# Patient Record
Sex: Female | Born: 1935 | Race: White | Hispanic: No | Smoking: Never smoker
Health system: Southern US, Community
[De-identification: ages and names within clinical notes are randomized; demographics above are authoritative.]

## PROBLEM LIST (undated history)

## (undated) DIAGNOSIS — E785 Hyperlipidemia, unspecified: Secondary | ICD-10-CM

## (undated) DIAGNOSIS — Z8679 Personal history of other diseases of the circulatory system: Secondary | ICD-10-CM

## (undated) DIAGNOSIS — I252 Old myocardial infarction: Secondary | ICD-10-CM

## (undated) DIAGNOSIS — I2510 Atherosclerotic heart disease of native coronary artery without angina pectoris: Secondary | ICD-10-CM

## (undated) DIAGNOSIS — I6529 Occlusion and stenosis of unspecified carotid artery: Secondary | ICD-10-CM

## (undated) DIAGNOSIS — Z951 Presence of aortocoronary bypass graft: Secondary | ICD-10-CM

## (undated) DIAGNOSIS — E7800 Pure hypercholesterolemia, unspecified: Secondary | ICD-10-CM

## (undated) DIAGNOSIS — I959 Hypotension, unspecified: Secondary | ICD-10-CM

## (undated) HISTORY — DX: Presence of aortocoronary bypass graft: Z95.1

## (undated) HISTORY — DX: Old myocardial infarction: I25.2

## (undated) HISTORY — DX: Hyperlipidemia, unspecified: E78.5

## (undated) HISTORY — DX: Personal history of other diseases of the circulatory system: Z86.79

## (undated) HISTORY — DX: Atherosclerotic heart disease of native coronary artery without angina pectoris: I25.10

## (undated) HISTORY — DX: Occlusion and stenosis of unspecified carotid artery: I65.29

## (undated) HISTORY — DX: Pure hypercholesterolemia, unspecified: E78.00

## (undated) HISTORY — DX: Hypotension, unspecified: I95.9

---

## 2007-11-19 ENCOUNTER — Encounter: Payer: Self-pay | Admitting: Emergency Medicine

## 2007-11-19 ENCOUNTER — Inpatient Hospital Stay (HOSPITAL_COMMUNITY): Admission: EM | Admit: 2007-11-19 | Discharge: 2007-12-04 | Payer: Self-pay | Admitting: Cardiovascular Disease

## 2007-11-19 DIAGNOSIS — I252 Old myocardial infarction: Secondary | ICD-10-CM

## 2007-11-19 HISTORY — DX: Old myocardial infarction: I25.2

## 2007-11-21 HISTORY — PX: CORONARY ANGIOPLASTY WITH STENT PLACEMENT: SHX49

## 2007-11-23 ENCOUNTER — Encounter: Payer: Self-pay | Admitting: Surgery

## 2007-11-23 ENCOUNTER — Ambulatory Visit: Payer: Self-pay | Admitting: Surgery

## 2007-11-24 ENCOUNTER — Encounter: Payer: Self-pay | Admitting: Surgery

## 2007-11-30 DIAGNOSIS — Z951 Presence of aortocoronary bypass graft: Secondary | ICD-10-CM

## 2007-11-30 HISTORY — PX: CORONARY ARTERY BYPASS GRAFT: SHX141

## 2007-11-30 HISTORY — DX: Presence of aortocoronary bypass graft: Z95.1

## 2008-01-02 ENCOUNTER — Encounter: Admission: RE | Admit: 2008-01-02 | Discharge: 2008-01-02 | Payer: Self-pay | Admitting: Surgery

## 2008-01-02 ENCOUNTER — Ambulatory Visit: Payer: Self-pay | Admitting: Surgery

## 2010-05-01 IMAGING — CT CT ANGIO ABDOMEN
2 of 5 series · 16 of 46 positions shown, 18 images · IV contrast (APPLIED)
Comparison: 11/19/2007

CLINICAL DATA: Aortic dissection, right chest pain

CT ANGIOGRAPHY OF THE CHEST, ABDOMEN, AND PELVIS WITH AND WITHOUT
CONTRAST:
TECHNIQUE: CT angiography of the chest, abdomen, and pelvis
performed using aortic protocol.
Examination is performed before and following 100 ml 8mnipaque-4DD.
Sagittal and coronal images reconstructed from axial data set.
Contrast:  100 ml 8mnipaque-4DD.

[Series 5: dissection 2.0 st · axial · 0.72mm/px · z∈[-572,-62]mm · 13 of 287 slices shown, 15 images]
[im 16/287  soft-tissue]
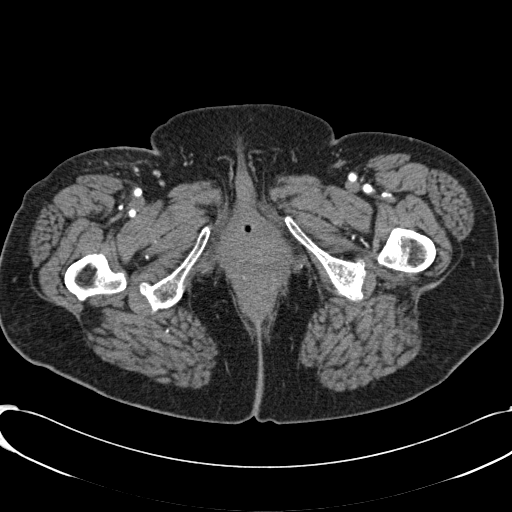
[im 16/287  bone]
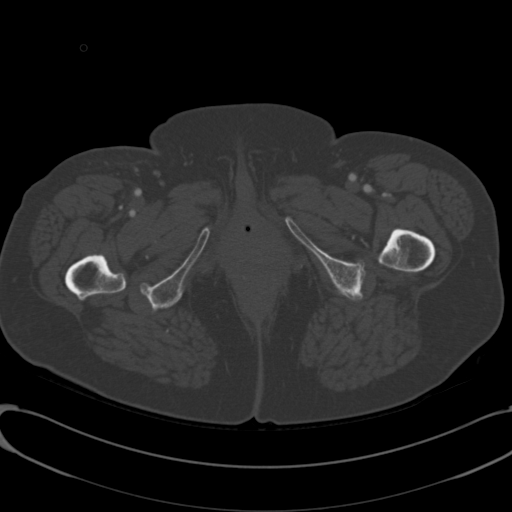
[im 46/287  soft-tissue]
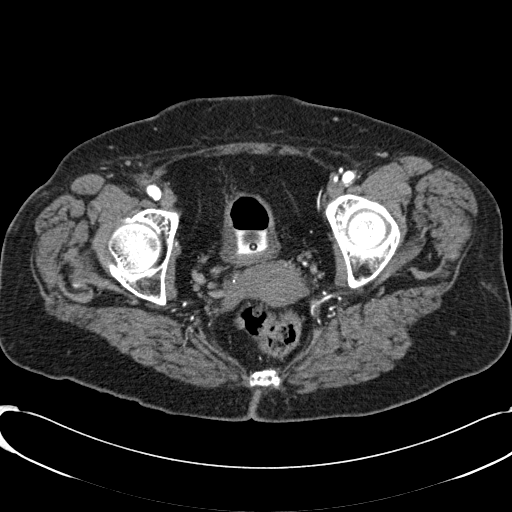
[im 61/287  soft-tissue]
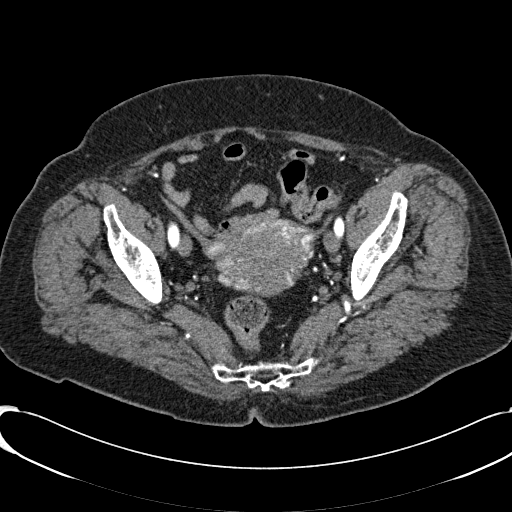
[im 76/287  soft-tissue]
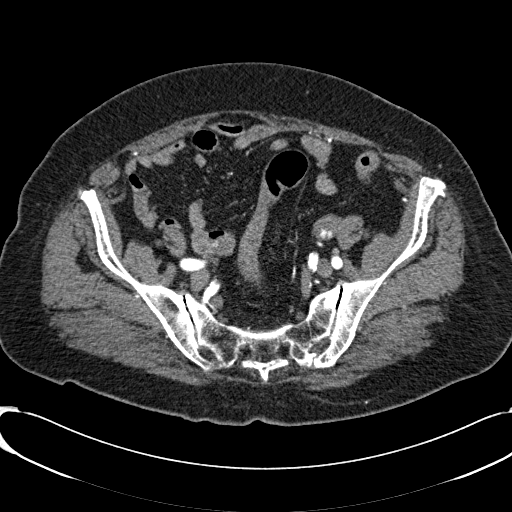
[im 106/287  soft-tissue]
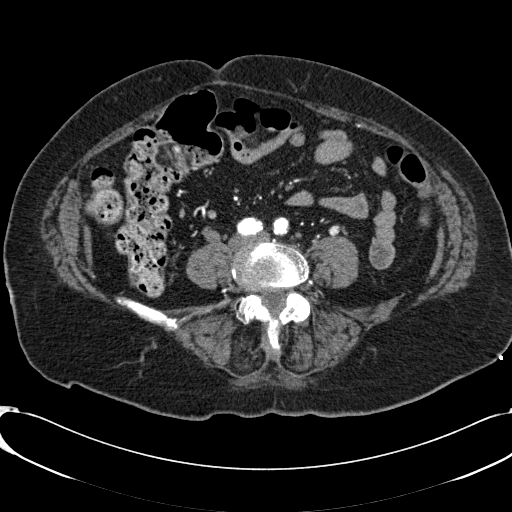
[im 121/287  soft-tissue]
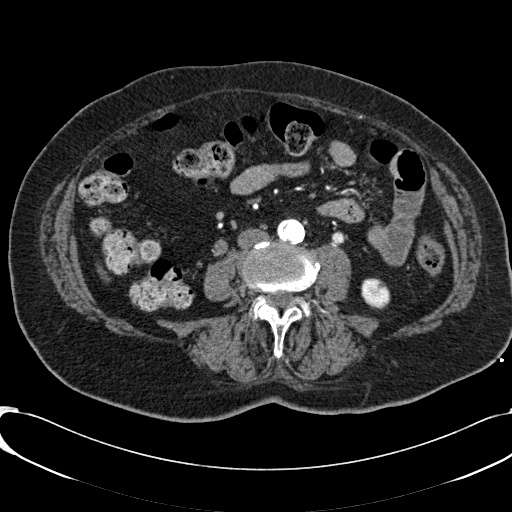
[im 151/287  soft-tissue]
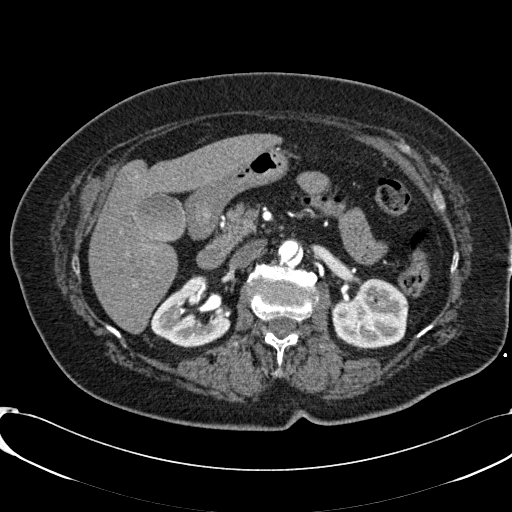
[im 166/287  soft-tissue]
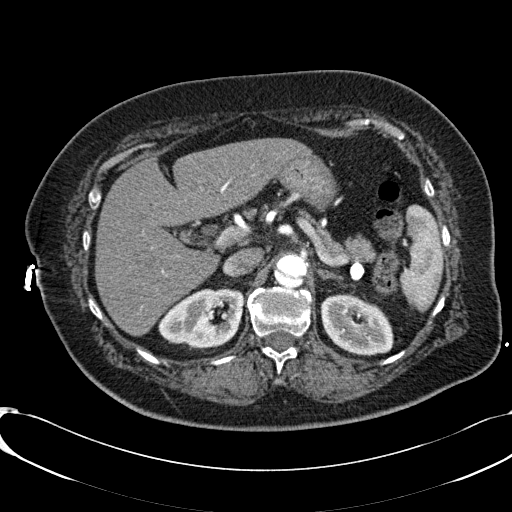
[im 181/287  soft-tissue]
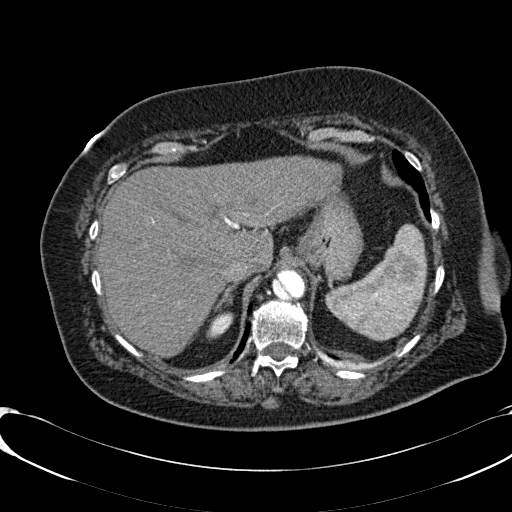
[im 181/287  bone]
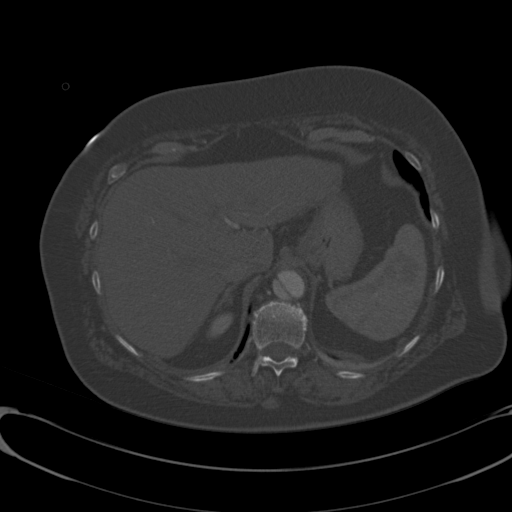
[im 211/287  soft-tissue]
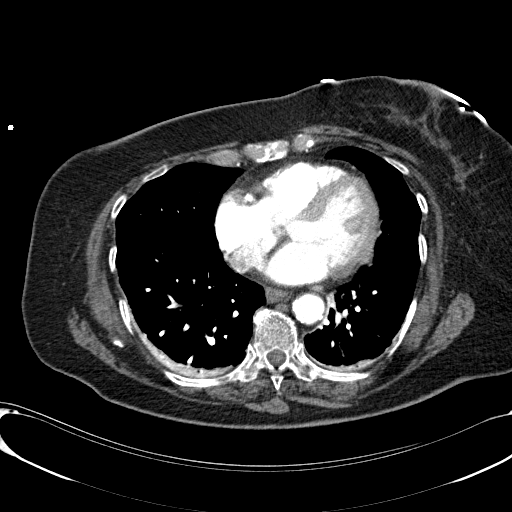
[im 226/287  soft-tissue]
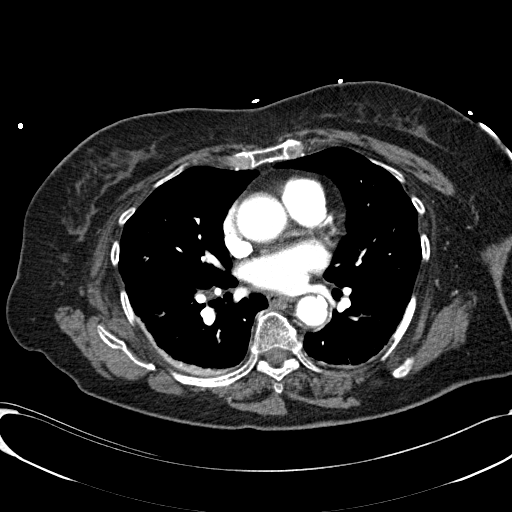
[im 241/287  soft-tissue]
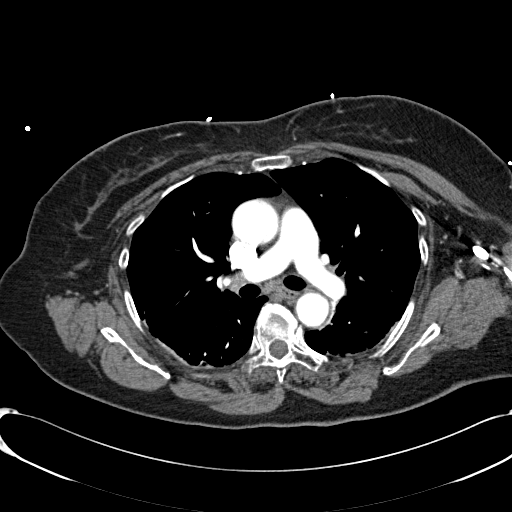
[im 271/287  soft-tissue]
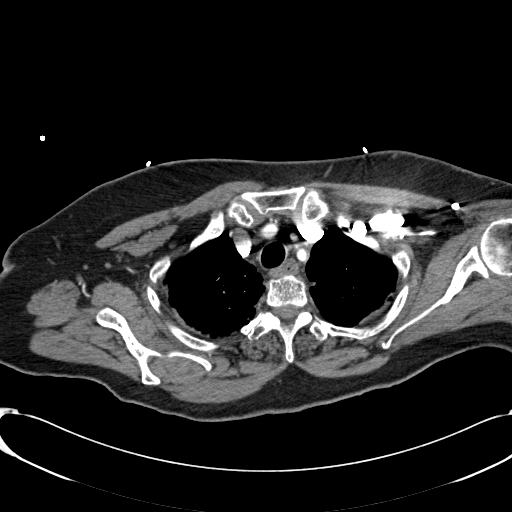

[Series 8: dissection 2.0 thins · coronal · 1.12mm/px · 3 of 100 slices shown]
[im 34/100  soft-tissue]
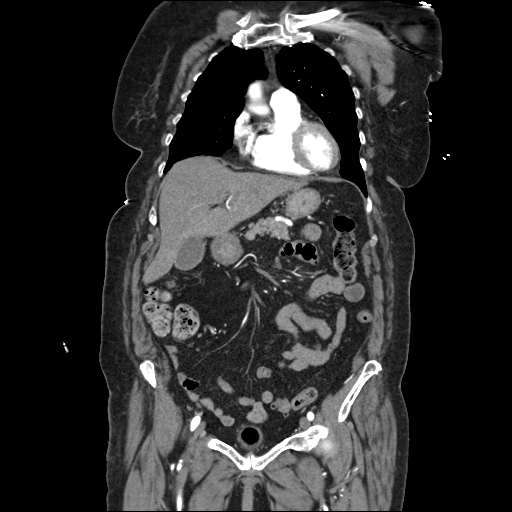
[im 45/100  soft-tissue]
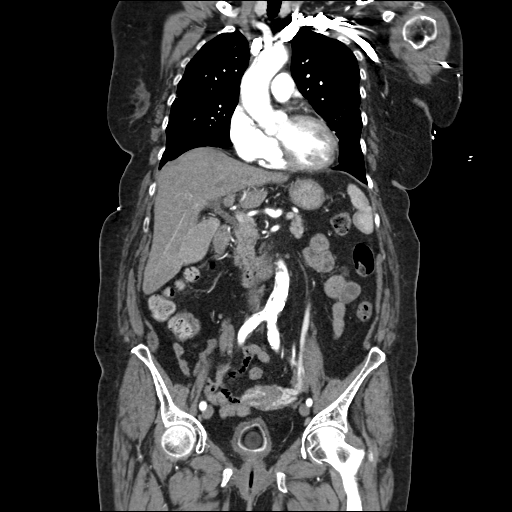
[im 56/100  soft-tissue]
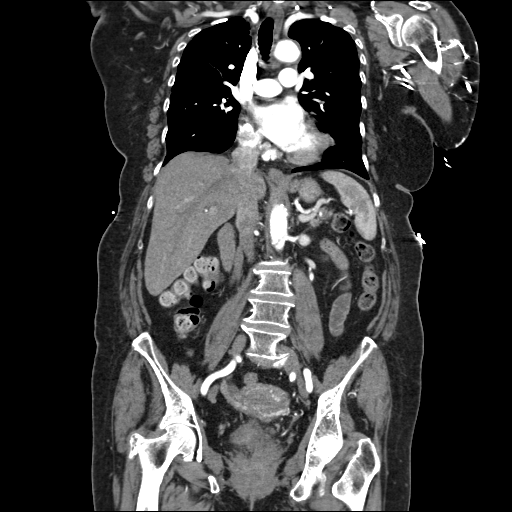

[16 of 46 positions shown; findings below may reference images not displayed]

CT ANGIOGRAPHY CHEST:

Ascending aorta and aortic arch are normal appearance.
No intramural hematoma on noncontrast images.
Proximal descending thoracic aorta normal caliber.
Scattered atherosclerotic calcifications present.
Aortic dissection identified at distal thoracic aorta beginning
just above the diaphragm and extending through the aortic hiatus
into upper abdominal aorta.
Pulmonary arteries patent.
Bibasilar atelectasis.
No thoracic adenopathy.
No pleural effusion or pneumothorax.
IMPRESSION: [HOSPITAL] Type B aortic dissection beginning in distal descending
thoracic aorta and extending into abdominal aorta, see below.

CT ANGIOGRAPHY ABDOMEN:

Aortic dissection extends from the distal descending thoracic aorta
into aortic bifurcation.
No extension of dissection into celiac, superior mesenteric, or
renal arteries.
An accessory right renal artery is noted.
IMA origin patent, though there is now opacification of a short
segment of proximal and may best appreciated on axial images at 168-
175.
Small amount of excreted contrast in renal pelves bilaterally.
Tiny cyst lower pole left kidney.
Remaining solid organs and bowel loops in upper abdomen
unremarkable.
Small calcified splenic artery aneurysm, 1.4 cm greatest size.
IMPRESSION: Aortic dissection extending from distal thoracic aorta to aortic
bifurcation, new since prior exam of 11/19/2007
Short segment of non opacification of proximal inferior mesenteric
artery, question high grade stenosis versus occlusion.

CT PELVIS:

Aortic dissection extends to aortic bifurcation but does not extend
into common iliac arteries.
Common iliac arteries are minimally prominent in size at 12 mm
diameter bilaterally.
Large and small bowel loops in pelvis normal appearance.
Air and Foley catheter with excreted contrast in urinary bladder.
No pelvic mass, adenopathy, free fluid, or inflammatory process.
No aneurysmal dilatation of distal aorta.
Mild degenerative disc disease changes spine.
IMPRESSION: No extension of aortic dissection into common iliac arteries or
pelvis.

## 2010-07-13 HISTORY — PX: NM MYOVIEW LTD: HXRAD82

## 2010-11-10 NOTE — Cardiovascular Report (Signed)
NAMEMANJU, KULKARNI                 ACCOUNT NO.:  [id number]   MEDICAL RECORD NO.:  [id number]          PATIENT TYPE:  INP   LOCATION:  2922                         FACILITY:  [facility]   PHYSICIAN:  Madaline Savage, M.D.DATE OF BIRTH:  12-Aug-1927   DATE OF PROCEDURE:  11/23/2007  DATE OF DISCHARGE:                            CARDIAC CATHETERIZATION   PROCEDURES:  1. Attempted right percutaneous femoral artery approach to cardiac      catheterization, which was never performed.  2. Right femoral sheath angiogram of the right femoral artery.   COMPLICATIONS:  The patient had aortic dissection, which ended at the  level of the diaphragms in the abdominal aorta.  Further studies, none.   PATIENT PROFILE:  [name] is a 77 year old married white female who  is a cardiology patient of [facility]  admitted by Dr. Susa Griffins.  She had had unstable angina and non-  ST-segment elevation MI and underwent elective cardiac catheterization  and stenting by Dr. Nanetta Batty, on Nov 21, 2007.  She was noted to  have severe stenosis in her mid-right coronary artery and in her more  distal RCA had a 99% stenosis, both of which were repaired with drug-  eluting stents.  She also had normal LV systolic function.   She also had a 90% proximal LAD stenosis, a 60% mid-LAD stenosis, and  then a 70% mid-LAD stenosis.  Today, the plan was to bring her to the  cath lab and perform coronary angiography in an attempt to repair the  LAD and its complex anatomical blockage by percutaneous intervention.   Before any of the catheterization today could be performed, I  encountered dissection, first at the level of the common iliacs and/or  the distal aorta, which I was able to ultimately navigate and then I got  into the abdominal aorta and then I encountered a 100% occlusion in the  abdominal aorta right at the level of the diaphragms.  The patient had  her Angiomax stopped  at that point.  I confirmed that there was no  dissection at the distal end of the indwelling sheath that was used for  today's catheterization and at that point we called Dr. Evelene Croon,  who came in a very prompt fashion and reviewed the case with me.   It was discussed that the patient will likely have no long-term sequelae  from this dissection and that surgical intervention would not be  required.   We discussed it and decided to perform a CT of the abdominal aorta later  on today to have more information about the stent of the dissection and  to manage it medically.   The patient has had drug-eluting stents placed 48 hours ago and he will  need to remain on aspirin and Plavix for that.  The patient will be  returned to her room in the coronary care step-down unit and we will  follow her hemoglobin, vital signs, and then other clinical parameters.           ______________________________  Madaline Savage, M.D.  WHG/MEDQ  D:  11/23/2007  T:  11/24/2007  Job:  [id number]   cc:   Cardiac Catheterization Lab, [facility]  Nanetta Batty, M.D.

## 2010-11-10 NOTE — Op Note (Signed)
NAMESREYA, FROIO                 ACCOUNT NO.:  [id number]   MEDICAL RECORD NO.:  [id number]          PATIENT TYPE:  INP   LOCATION:  2310                         FACILITY:  [facility]   PHYSICIAN:  Evelene Croon, M.D.     DATE OF BIRTH:  07/25/1927   DATE OF PROCEDURE:  11/30/2007  DATE OF DISCHARGE:                               OPERATIVE REPORT   PREOPERATIVE DIAGNOSIS:  Severe two-vessel coronary artery disease.   POSTOPERATIVE DIAGNOSIS:  Severe two-vessel coronary artery disease.   OPERATIVE PROCEDURE:  Median sternotomy, extracorporeal circulation,  coronary artery bypass graft surgery x3 using a left internal mammary  artery graft to left anterior descending coronary artery, with saphenous  vein graft to the first and second diagonal branches of the left  anterior descending artery.  Endoscopic vein harvesting from the right  leg.   ATTENDING SURGEON:  Evelene Croon, MD   ASSISTANT:  Loreli Slot, MD   SECOND ASSISTANT:  Rowe Clack, PA-C   ANESTHESIA:  General endotracheal.   CLINICAL HISTORY:  This patient is a 77 year old woman with severe three-  vessel coronary artery disease who was admitted on Nov 19, 2007 with a  non-ST segment elevation MI.  She underwent an acute catheterization  which showed severe two-vessel coronary disease.  The LAD had 90%  proximal stenosis just at the takeoff of a large first diagonal branch.  Then, there was 60 and 70% proximal and mid LAD stenoses straddling a  moderate size second diagonal branch.  The LAD was a large vessel that  wrapped around the apex.  The left circumflex was relatively small and  had no significant disease.  The right coronary artery was a large  dominant vessel that had 80% midvessel stenosis and a 99% distal  stenosis just beyond the posterior descending branch.  Left ventricular  function was normal.  The patient underwent acute stenting of the right  coronary lesions with a good result.  The patient  had drug-eluting  stents placed.  She was then brought back to the catheterization lab on  Nov 23, 2007 at which time she had attempted PCI of the LAD lesions.  Unfortunately, there was difficulty and advancing a catheter up through  the right iliac system and after injection of contrast into the catheter  was noted the patient had a localized aortic dissection extending from  the area of the aortic bifurcation up to the level of diaphragm.  The  procedure was aborted that time and I was asked to see the patient in  the catheterization lab.  After review of the catheterization, I felt  that this was a limited dissection and no intervention was needed.  She  had neurovascular compromise at that time.  She underwent a CT angiogram  of the thoracic and abdominal aorta and the dissection appeared to be  limited to the area of the aortic bifurcation up to the diaphragm.  There was contrast in both the true and false lumens with a true lumen  supplied all the abdominal visceral vessels.  The patient was maintained  on  Plavix during the new drug-eluting stents.  She continued to have  chest pain off and on as well as some lower back pain felt to be  secondary to the dissection.  She remained stable throughout.  After  further review, it was felt that the best treatment for her would be to  proceed with coronary bypass surgery to revascularize her high-grade LAD  and diagonal stenosis.  I discussed the operative procedure with her and  her husband including alternatives, benefits, and risks including but  not limited to bleeding, blood transfusion, infection, stroke,  myocardial infarction, graft failure, and death.  They understood and  agreed to proceed.   OPERATIVE PROCEDURE:  The patient was taken operating room and placed on  table in supine position.  After induction of general endotracheal  anesthesia, a Foley catheter was placed in bladder using sterile  technique.  Then the chest,  abdomen, and both lower extremities were  prepped and draped in usual sterile manner.  The chest was entered  through a median sternotomy incision.  The pericardium of midline.  Examination heart showed good ventricular contractility.  The ascending  aorta had no palpable plaques in it.  The aorta was in fairly normal  size.   Then, the left internal mammary artery was harvested from the chest wall  as pedicle graft.  This is a medium caliber vessel with excellent blood  flow through it.  At the same time, a segment of greater saphenous vein  was harvested from the right leg using endoscopic vein harvest  technique.  This vein was a medium size and good quality.   Then, the patient was heparinized when an adequate activated clotting  time was achieved.  The distal ascending aorta was cannulated using 20-  French aortic cannula for arterial inflow.  Venous outflow was achieved  using a two-stage venous cannula through the right atrial appendage.  An  antegrade cardioplegia and vent cannula was inserted aortic root.   The patient placed on cardiopulmonary bypass and distal coronaries  identified.  The LAD was a large graftable vessel.  Both diagonal  branches were moderate sized graftable vessels.   Then, the aorta was crossclamped at 500 mL of cold blood antegrade  cardioplegia was administered in the aortic root with quick arrest of  the heart.  Systemic hypothermia to 20 degrees centigrade and topical  hypothermic iced saline was used.  A temperature probe was placed in  septum insulating pad in the pericardium.   The first distal anastomosis was then performed to the first diagonal  branch.  The internal diameter was 1.75 mm.  The conduit used was a  segment of greater saphenous vein anastomosis performed end-to-side  manner using continuous 7-0 Prolene suture.  Flow was measured through  the graft was excellent.   Second distal anastomosis was performed the second diagonal  branch.  The  internal diameter of this vessel was about 1.6 mm.  Conduit used was a  second segment of greater saphenous vein and the anastomosis performed  end-to-side manner using continuous 7-0 Prolene suture.  Flow was  measured through the graft was excellent.  Then, another dose of  cardioplegia given down vein grafts and aortic root.   The third distal anastomosis was performed to the midportion of the left  anterior descending coronary artery.  The internal diameter of this  vessel was about 2 mm.  Conduit used was the left internal mammary  graft.  This brought through an opening  left pericardium anterior of the  phrenic nerve.  It was anastomosed the LAD in end-to-side manner using  continuous 8-0 Prolene suture.  The pedicle was sutured to the  epicardium 6-0 Prolene sutures.  The patient was then rewarmed to 37  degrees centigrade.  With crossclamp in place, 2 proximal vein graft  anastomosis were performed in end-to-side manner using continuous 6-0  Prolene suture.  Then clamp was removed from mammary pedicle.  There is  rapid warming of the ventricular septum and return of spontaneous  ventricular fibrillation.  The crossclamp removed with time of 47  minutes and the patient spontaneously converted to sinus rhythm.   The proximal and distal anastomoses appeared hemostatic while the grafts  satisfactory.  Graft markers placed around the proximal anastomoses.  Two temporary right ventricular and right atrial pacing wires placed  above the skin.   When the patient rewarmed to 37 degrees centigrade, she was weaned from  cardiopulmonary bypass on no inotropic agents.  Total bypass time was 60  minutes.  Cardiac function appeared excellent.  Cardiac output of 5.5  liters a minute.  Protamine was given and the venous and the aortic  cannula was removed without difficulty.  Hemostasis was achieved.  The  patient was given 1 unit of platelet transfusion due to being on Plavix   preoperatively.  Then, three chest tubes were placed with tube in the  post pericardium, one in the left pleural space, one in the anterior  mediastinum.  The sternum was closed #6 stainless steel wires.  The  fascia was closed with continuous #1 Vicryl suture.  Subcutaneous tissue  was closed with continuous 2-0 Vicryl and the skin with 3-0 Vicryl  subcuticular closure.  The lower extremity vein harvest site was closed  in layers in similar manner.  The sponge, needle, and instrument counts  were correct according to the scrub nurse.  Dry sterile dressing applied  over the incisions around the chest tubes with Pleur-Evac suction.  The  patient remained hemodynamically stable and transferred to the SICU in  guarded but stable condition.      Evelene Croon, M.D.  Electronically Signed     BB/MEDQ  D:  11/30/2007  T:  12/01/2007  Job:  [id number]   cc:   Madaline Savage, M.D.

## 2010-11-10 NOTE — Assessment & Plan Note (Signed)
OFFICE VISIT   [name]  DOB:  11/05/1927                                        January 02, 2008  CHART #:  [id number]   HISTORY:  The patient returned today for followup status post coronary  artery bypass graft surgery x3 on 11/30/2007.  This was performed after  she had a catheter-related abdominal aortic dissection during attempted  percutaneous intervention.  Her postoperative course was uncomplicated  and she was maintained on Plavix due to having a new drug-eluting stent  in place.  Since discharge, she has been feeling great and is walking  daily without chest pain or shortness of breath.   PHYSICAL EXAMINATION:  VITAL SIGNS:  Her blood pressure is 117/77.  Her  pulse is 80 and regular.  Respiratory rate is 16 and unlabored.  Oxygen  saturation on room air is 100%.  GENERAL:  She looks well.  CARDIAC:  Regular rate and rhythm with normal heart sounds.  LUNG:  Clear.  CHEST:  The chest incision is healing well and the sternum is stable.  EXTREMITIES:  Her right leg incision is healing well.  There is no  peripheral edema.  Pedal pulses are palpable bilaterally.   IMAGING STUDIES:  Followup chest x-ray shows clear lung fields and no  pleural effusions.   MEDICATIONS:  1. Aspirin 325 mg daily.  2. Plavix 75 mg daily.  3. Zocor 40 mg daily.  4. Toprol-XL 25 mg daily.  5. Multivitamin daily.  6. Calcium daily.  7. Vitamin C and D daily.   She asked me that if she could decrease her aspirin to 81 mg per day  since she said she is tending to get hemorrhages in her skin very easily  on both aspirin and Plavix.  I told her this should be fine.   IMPRESSION:  Overall, the patient is having a very good recovery  following coronary bypass surgery.  Her abdominal aortic dissection has  clinically remained stable and she shows no signs of peripheral  malperfusion.  I would expect this to cause no long-term problems for  her.  She did have  preoperative carotid Dopplers, which showed a 40-60%  right internal carotid artery stenosis and a 60-80% left internal  carotid artery stenosis and I will let Dr. Elsie [name] decide how he was to  follow that up.  She probably should have another followup ultrasound in  about 6 months.  I told her she could return to driving a car when feels  comfortable with that, but should refrain from lifting anything heavier  than 10 pounds for a total of 3 months from the date of surgery.  She  will continue to follow up with Dr. Elsie [name] and will contact me if she  develops any problems with her incision.   [name], M.D.  Electronically Signed   BB/MEDQ  D:  01/02/2008  T:  01/03/2008  Job:  [id number]   cc:   [name], M.D.

## 2010-11-10 NOTE — Cardiovascular Report (Signed)
NAMEMEDA, DUDZINSKI                 ACCOUNT NO.:  [id number]   MEDICAL RECORD NO.:  [id number]          PATIENT TYPE:  INP   LOCATION:  2922                         FACILITY:  [facility]   PHYSICIAN:  Nanetta Batty, M.D.   DATE OF BIRTH:  21-Dec-1927   DATE OF PROCEDURE:  DATE OF DISCHARGE:                            CARDIAC CATHETERIZATION   Ms. Iribe is a delightful 77 year old female admitted on Nov 19, 2007  with unstable angina/non ST-segment elevation MI.  She has a history  hyperlipidemia.  She saw Dr. Elsie Lincoln 17 years ago.  Her EKG showed a  potential posterior MI and her troponins were mildly elevated.  She was  on heparin and integralin, and had continuing chest pain.  She presents  now for diagnostic coronary angiography to define her anatomy, rule out  ischemic etiology.   DESCRIPTION OF PROCEDURE:  The patient was brought to the second floor  [facility] in a postabsorptive state.  She is  premedicated with p.o. Valium and IV fentanyl.  Her right groin was  prepped and shaved in usual sterile fashion.  1% Xylocaine was used as  local anesthesia.  A 6-French sheath was inserted into the right femoral  artery using standard Seldinger technique.  A 6-French right-left  Judkins diagnostic catheter as well as 6-French pigtail catheter were  used for selective coronary angiography, left ventriculography,  supravalvular aortography, and distal abdominal aortography.  Visipaque  dye was used entirety of the case.  Aortic, left ventricular fundi  pressures were recorded.   Hemodynamics:  1. Aortic systolic pressure 115 and diastolic pressure 68.  2. Left ventricle systolic pressure 115 with end-diastolic pressure      and diastolic pressure was not elevated.   Selective coronary angiography:  1. Left main 20% tapered ostial stenosis.   LAD:  LAD had 90% stenosis.  There was an apple-core lesion just before  the first diagonal branch.  Following this, there was 60%  segmental  hypodense stenosis between D1 and D2 as well as after D2.   Circumflex:  Nondominant, free of significant disease.   Right coronary:  This a dominant vessel with a long 80% segmental lesion  at the genu and 99% lesion between the small PDA and the first PLA  branch.   Left ventriculography:  RAO left ventriculogram performed using 20 mL of  Visipaque dye at 10 mL per second.  Overall LVEF was estimated at  greater than 50% without focal wall motion abnormalities.   Supravalvular aortography:  Performed in the LAO view using 20 mL of  Visipaque dye at 20 mL per second.  The arch vessels were intact and  there is no AI.  Supravalvular exam of the aorta appeared generous.   Distal abdominal aortography:  Distal abdominal aortogram was performed  using 20 mL of Visipaque dye at 20 mL per second.  The renal arteries  widely patent.  The infrarenal abdominal aorta and iliac bifurcation  were free of significant arthrosclerotic changes.   IMPRESSION:  Ms. Ruggiero has two-vessel disease with a posterior  myocardial infraction, the  infarct related artery was the dominant  right.  I reviewed the angiograms with Dr. Jacinto Halim, we both agree that  percutaneous coronary intervention and stenting of the right coronary  artery followed by staged left anterior descending intervention is an  appropriate strategy.   The patient was on Integrilin infusion.  She received 3500 units of  heparin intravenously with an ACT above 232.  She had received aspirin,  receive Plavix 600 mg p.o. as well as Pepcid IV.   Using a 6-French JR-4 guide catheter along with 16109 [employer] soft wire  soft wire and a 2.5 x 12 Voyager PTCA was performed of the distal right  as well as the mid right coronary artery.  Following this, a 3.0 x 12  PROMUS stent was then deployed between the small PDA and the larger PLA  at 40 atmospheres.  A 3.0 x 23 PROMUS was then deployed in the mid to  distal right at 40 atmospheres.   The distal stent was then post dilated  with a 3.0 x 10 DuraStar up to 14-15 atmospheres and the mid to distal  stent with a 3.5 x 20 DuraStar at 14-15 atmospheres.  Resulting in  reduction of 95% distal RCA stenosis to 0% residual, 80% mid to distal  at the genu to 0% residual with excellent flow.  The patient tolerated  procedure well.  The guidewire and catheter removed.  The sheath was  then secured in place.  The patient left lab in stable condition.  Plan  will be to remove the sheath once ACT falls below 170.  Integrilin will  be continued for 18 hours.  She will be treated aspirin, Plavix, beta  blockers, and statin drug.  She will be gently hydrated.  Plans will be  to perform staged LAD, PCI later this week.  The patient left lab in  stable condition.      Nanetta Batty, M.D.  Electronically Signed     JB/MEDQ  D:  11/21/2007  T:  11/22/2007  Job:  [id number]   cc:   Madaline Savage, M.D.

## 2010-11-10 NOTE — Discharge Summary (Signed)
[name], [name]                 ACCOUNT NO.:  [id number]   MEDICAL RECORD NO.:  [id number]          PATIENT TYPE:  INP   LOCATION:  2017                         FACILITY:  [facility]   PHYSICIAN:  Evelene Croon, M.D.     DATE OF BIRTH:  Aug 24, 1927   DATE OF ADMISSION:  11/19/2007  DATE OF DISCHARGE:  12/04/2007                               DISCHARGE SUMMARY   HISTORY OF PRESENT ILLNESS:  The patient is a 77 year old white female  with no previous cardiac history who develops sudden chest pain on the  night of admission.  She was taking a nap on the sofa and woke up to go  to bed when the pain presented in the midsternal region, 10/10, with  radiation to both arms as well as to her teeth.  Additionally, she felt  very dizzy and had blurred vision.  She thought she would fall.  Her  husband drove her over from [place] to the Emergency Department at  [facility], where she was treated for unstable coronary  syndrome and upon stabilization was transferred to [facility]  for further evaluation and treatment to include cardiac catheterization.   PAST MEDICAL HISTORY:  Includes hypercholesterolemia and history of a  vaginal cyst in her early 39s with septicemia.  She has had childbirth  x10.  She had no previous cardiac history.   ALLERGIES:  No known drug allergies.   MEDICATIONS PRIOR TO ADMISSION:  Included fish oil and vitamins.   FAMILY HISTORY:  Please see the history and physical done at the time of  admission.   SOCIAL HISTORY:  Please see the history and physical done at the time of  admission.   REVIEW OF SYSTEMS:  Please see the history and physical done at the time  of admission.   PHYSICAL EXAMINATION:  Please see the history and physical done at the  time of admission.   HOSPITAL COURSE:  The patient was admitted and ruled inferior non-ST-  segment myocardial infarction with acute coronary syndrome.  Findings on  EKG were consistent with posterior  ischemia.  She was scheduled and  underwent cardiac catheterization on Nov 21, 2007 by Dr. Nanetta Batty.  She underwent PCI and stent placement in the right coronary artery.  The  schedule following this procedure was to proceed with LAD  revascularization in a staged procedure.  She tolerated the initial  catheterization.  She was brought back to the cardiac catheterization  lab on Nov 23, 2007 by Dr. Elsie Lincoln.  At that time, she underwent an  aortic dissection due to the catheter.  The patient underwent a CT scan,  which confirmed the type B dissection.  The patient also had a thoracic  surgical consultation with Evelene Croon, MD, who evaluated the patient  and the studies.  His recommendation was to proceed with surgical  revascularization of the LAD and diagonal segments as she had ongoing  ischemia.  The patient remained stable, although she did have ongoing  abdominal and back pain related to the dissection.  A repeat CT  angiogram revealed  this to be a stable process.  It was continued to be  treated medically.  She was deemed to be acceptable for proceeding with  surgery, and on November 30, 2007, she underwent the following procedure of  coronary artery bypass grafting x3.  The following grafts were placed:  1. Left internal mammary artery to the LAD.  2. Saphenous vein graft to the diagonal #1.  3. Saphenous vein graft to the diagonal #3.   She tolerated this procedure well and was taken to the surgical  intensive care unit in a stable condition.   POSTOPERATIVE HOSPITAL COURSE:  The patient has overall done quite well.  She has remained hemodynamically stable.  Due to the drug-eluting  stents, she was placed back on aspirin as well as Plavix.  All routine  lines, monitors, and drainage devices were discontinued in a standard  fashion.  She had no recurrence of her abdominal or back pain symptoms  related to the dissection.   LABORATORY DATA:  Laboratory values did reveal  moderate postoperative  anemia with most recent hemoglobin and hematocrit dated December 02, 2007 of  9.7 and 28.3.  Electrolytes, BUN, and creatinine are within normal  limits.  Incisions are all healing well without signs of infection.  She  is now back at her preoperative weight after a gentle diuresis.  Oxygen  has been weaned and she maintains good saturations on room air.  Her  rhythm has remain sinus without significant ectopy or dysrhythmias.  Overall, her status is felt to be acceptable for discharge on today's  date, December 04, 2007.   MEDICATIONS ON DISCHARGE:  1. Multivitamin 1 daily.  2. Vitamin D 1 daily.  3. Vitamin C 1 daily.  4. Calcium 1 daily.  5. Aspirin 325 mg daily.  6. Plavix 75 mg daily.  7. Zocor 40 mg daily.  8. Toprol-XL 25 mg daily.  9. Ultram 50 mg q.6 h. p.r.n. as needed for pain.   INSTRUCTIONS:  The patient received written instructions regarding  medications, activities, diet, wound care, and followup.  Followup  include Dr. Elsie Lincoln in 2 weeks, Dr. Laneta Simmers in 3 weeks.   FINAL DIAGNOSES:  Non-ST-segment elevation myocardial infarction with  multivessel coronary artery disease, now status post percutaneous  coronary intervention and drug-eluting stenting of the right coronary  artery x2.  Additionally, status post coronary artery bypass grafting x3  as described above.   OTHER DIAGNOSES:  1. Postoperative anemia.  2. Hypercholesterolemia.  3. Abdominal aortic dissection.  4. Remote history of vaginal cyst with secondary septicemia.  5. Normal left ventricular ejection fraction by catheterization.      Rowe Clack, P.A.-C.      Evelene Croon, M.D.  Electronically Signed    WEG/MEDQ  D:  12/04/2007  T:  12/05/2007  Job:  [id number]   cc:   Madaline Savage, M.D.  Evelene Croon, M.D.

## 2011-03-24 LAB — CK TOTAL AND CKMB (NOT AT ARMC)
CK, MB: 4.5 — ABNORMAL HIGH
CK, MB: 8 — ABNORMAL HIGH
CK, MB: 8.7 — ABNORMAL HIGH
Relative Index: 2.9 — ABNORMAL HIGH
Relative Index: 4.8 — ABNORMAL HIGH
Total CK: 174
Total CK: 239 — ABNORMAL HIGH

## 2011-03-24 LAB — POCT CARDIAC MARKERS
CKMB, poc: 7.6
Myoglobin, poc: 113
Operator id: 294591

## 2011-03-24 LAB — BASIC METABOLIC PANEL
BUN: 18
BUN: 7
BUN: 8
CO2: 21
CO2: 23
CO2: 23
CO2: 23
CO2: 23
CO2: 24
CO2: 25
Calcium: 8.1 — ABNORMAL LOW
Calcium: 8.3 — ABNORMAL LOW
Calcium: 8.7
Chloride: 107
Chloride: 108
Chloride: 109
Chloride: 109
Chloride: 109
Chloride: 110
Creatinine, Ser: 0.54
Creatinine, Ser: 0.62
Creatinine, Ser: 0.65
Creatinine, Ser: 0.66
Creatinine, Ser: 0.69
Creatinine, Ser: 0.8
GFR calc Af Amer: >60
GFR calc Af Amer: >60
GFR calc Af Amer: >60
GFR calc Af Amer: >60
GFR calc non Af Amer: >60
Glucose, Bld: 102 — ABNORMAL HIGH
Glucose, Bld: 103 — ABNORMAL HIGH
Glucose, Bld: 107 — ABNORMAL HIGH
Glucose, Bld: 98
Potassium: 3.7
Potassium: 3.8
Potassium: 3.9
Sodium: 137
Sodium: 139

## 2011-03-24 LAB — CBC
HCT: 35.8 — ABNORMAL LOW
HCT: 36
HCT: 36
HCT: 36.5
HCT: 41.7
Hemoglobin: 12.4
Hemoglobin: 12.5
Hemoglobin: 13.1
MCHC: 33.8
MCHC: 34.2
MCHC: 34.2
MCHC: 34.3
MCHC: 34.4
MCHC: 34.6
MCHC: 34.8
MCHC: 35.3
MCHC: 36
MCV: 92.6
MCV: 93.1
MCV: 93.3
MCV: 93.5
MCV: 93.6
MCV: 94
MCV: 94.1
Platelets: 161
Platelets: 162
Platelets: 168
Platelets: 182
Platelets: 195
RBC: 3.34 — ABNORMAL LOW
RBC: 3.75 — ABNORMAL LOW
RBC: 3.83 — ABNORMAL LOW
RBC: 3.86 — ABNORMAL LOW
RBC: 3.88
RBC: 4.1
RDW: 12.9
RDW: 12.9
RDW: 13.1
RDW: 13.1
RDW: 13.2
WBC: 5.9
WBC: 6.1
WBC: 6.1
WBC: 6.8
WBC: 7

## 2011-03-24 LAB — TROPONIN I
Troponin I: 0.26 — ABNORMAL HIGH
Troponin I: 0.62
Troponin I: 0.87

## 2011-03-24 LAB — POCT I-STAT, CHEM 8
Creatinine, Ser: 0.9
Hemoglobin: 14.6
Potassium: 3.8
Sodium: 140
TCO2: 21

## 2011-03-24 LAB — HEPARIN LEVEL (UNFRACTIONATED)
Heparin Unfractionated: 0.19 — ABNORMAL LOW
Heparin Unfractionated: 0.5
Heparin Unfractionated: 0.52

## 2011-03-24 LAB — LIPID PANEL
LDL Cholesterol: 230 — ABNORMAL HIGH
Triglycerides: 85

## 2011-03-24 LAB — COMPREHENSIVE METABOLIC PANEL
ALT: 32
BUN: 9
CO2: 24
Calcium: 8.4
Creatinine, Ser: 0.57
GFR calc non Af Amer: >60
Glucose, Bld: 106 — ABNORMAL HIGH
Total Protein: 5.9 — ABNORMAL LOW

## 2011-03-24 LAB — HEMOGLOBIN A1C
Hgb A1c MFr Bld: 6.1
Mean Plasma Glucose: 140

## 2011-03-24 LAB — DIFFERENTIAL
Basophils Absolute: 0
Basophils Relative: 0
Eosinophils Absolute: 0.1
Eosinophils Absolute: 0.2
Eosinophils Absolute: 0.2
Eosinophils Relative: 2
Eosinophils Relative: 3
Lymphocytes Relative: 38
Lymphocytes Relative: 45
Lymphs Abs: 1.7
Lymphs Abs: 2.2
Lymphs Abs: 2.6
Monocytes Relative: 10
Neutro Abs: 2.3
Neutrophils Relative %: 40 — ABNORMAL LOW
Neutrophils Relative %: 49
Neutrophils Relative %: 63

## 2011-03-24 LAB — PROTIME-INR
INR: 1
Prothrombin Time: 13.1

## 2011-03-24 LAB — CARDIAC PANEL(CRET KIN+CKTOT+MB+TROPI)
CK, MB: 4
Relative Index: 3.1 — ABNORMAL HIGH

## 2011-03-24 LAB — MAGNESIUM: Magnesium: 2.3

## 2011-03-24 LAB — TYPE AND SCREEN: DAT, IgG: NEGATIVE

## 2011-03-24 LAB — TSH: TSH: 3.393

## 2011-03-25 LAB — CREATININE, SERUM
Creatinine, Ser: 0.49
GFR calc Af Amer: >60
GFR calc non Af Amer: >60

## 2011-03-25 LAB — PREPARE PLATELET PHERESIS

## 2011-03-25 LAB — CBC
HCT: 28.3 — ABNORMAL LOW
Hemoglobin: 11 — ABNORMAL LOW
Hemoglobin: 8.8 — ABNORMAL LOW
Hemoglobin: 9.7 — ABNORMAL LOW
MCHC: 34.5
MCV: 89.4
MCV: 89.7
MCV: 90.4
Platelets: 220
RBC: 2.82 — ABNORMAL LOW
RBC: 2.98 — ABNORMAL LOW
RBC: 3.1 — ABNORMAL LOW
RBC: 3.12 — ABNORMAL LOW
RBC: 3.41 — ABNORMAL LOW
RDW: 14.7
RDW: 14.8
WBC: 7.4
WBC: 7.9
WBC: 9.2

## 2011-03-25 LAB — BLOOD GAS, ARTERIAL
Bicarbonate: 22.4
O2 Saturation: 94.2
pO2, Arterial: 69.8 — ABNORMAL LOW

## 2011-03-25 LAB — BASIC METABOLIC PANEL
CO2: 23
CO2: 27
Calcium: 7.5 — ABNORMAL LOW
Calcium: 7.6 — ABNORMAL LOW
Calcium: 8.3 — ABNORMAL LOW
Calcium: 9
Chloride: 102
Chloride: 109
Creatinine, Ser: 0.51
Creatinine, Ser: 0.68
GFR calc Af Amer: >60
GFR calc Af Amer: >60
GFR calc Af Amer: >60
GFR calc Af Amer: >60
GFR calc non Af Amer: >60
GFR calc non Af Amer: >60
Sodium: 134 — ABNORMAL LOW
Sodium: 140
Sodium: 140
Sodium: 141

## 2011-03-25 LAB — POCT I-STAT 3, ART BLOOD GAS (G3+)
Acid-Base Excess: 1
Bicarbonate: 21.1
Bicarbonate: 24.7 — ABNORMAL HIGH
Bicarbonate: 25 — ABNORMAL HIGH
O2 Saturation: 100
Operator id: 203371
Operator id: 3390
Patient temperature: 33
Patient temperature: 36.1
TCO2: 22
TCO2: 26
pCO2 arterial: 30.6 — ABNORMAL LOW
pCO2 arterial: 33.2 — ABNORMAL LOW
pH, Arterial: 7.436 — ABNORMAL HIGH
pH, Arterial: 7.479 — ABNORMAL HIGH
pO2, Arterial: 344 — ABNORMAL HIGH

## 2011-03-25 LAB — CROSSMATCH

## 2011-03-25 LAB — COMPREHENSIVE METABOLIC PANEL
ALT: 29
AST: 28
Albumin: 3.2 — ABNORMAL LOW
Alkaline Phosphatase: 205 — ABNORMAL HIGH
GFR calc Af Amer: >60
Glucose, Bld: 116 — ABNORMAL HIGH
Potassium: 3.9
Sodium: 139
Total Protein: 6.6

## 2011-03-25 LAB — POCT I-STAT 4, (NA,K, GLUC, HGB,HCT)
Glucose, Bld: 112 — ABNORMAL HIGH
Glucose, Bld: 125 — ABNORMAL HIGH
Glucose, Bld: 189 — ABNORMAL HIGH
HCT: 15 — ABNORMAL LOW
HCT: 25 — ABNORMAL LOW
HCT: 33 — ABNORMAL LOW
Hemoglobin: 5.1 — CL
Hemoglobin: 8.8 — ABNORMAL LOW
Operator id: 148841
Operator id: 3390
Potassium: 3.8
Potassium: 4.5
Potassium: 4.6
Sodium: 132 — ABNORMAL LOW

## 2011-03-25 LAB — PROTIME-INR
INR: 1.4
Prothrombin Time: 17.9 — ABNORMAL HIGH

## 2011-03-25 LAB — POCT I-STAT 3, VENOUS BLOOD GAS (G3P V)
Acid-base deficit: 1
O2 Saturation: 76
TCO2: 25
pO2, Ven: 28 — CL

## 2011-03-25 LAB — POCT I-STAT, CHEM 8
Calcium, Ion: 1.16
Hemoglobin: 8.8 — ABNORMAL LOW
Sodium: 141
TCO2: 23

## 2011-03-25 LAB — URINALYSIS, ROUTINE W REFLEX MICROSCOPIC
Nitrite: NEGATIVE
Specific Gravity, Urine: 1.011
Urobilinogen, UA: 4 — ABNORMAL HIGH

## 2011-03-25 LAB — MAGNESIUM
Magnesium: 2.3
Magnesium: 2.7 — ABNORMAL HIGH

## 2011-03-25 LAB — URINE MICROSCOPIC-ADD ON

## 2011-03-25 LAB — POCT I-STAT GLUCOSE
Glucose, Bld: 115 — ABNORMAL HIGH
Operator id: 3390

## 2011-03-25 LAB — APTT: aPTT: 42 — ABNORMAL HIGH

## 2011-03-25 LAB — URINE CULTURE: Colony Count: >=100000

## 2012-04-28 ENCOUNTER — Other Ambulatory Visit: Payer: Self-pay | Admitting: Cardiovascular Disease

## 2012-04-28 DIAGNOSIS — I7100 Dissection of unspecified site of aorta: Secondary | ICD-10-CM

## 2012-05-03 ENCOUNTER — Ambulatory Visit
Admission: RE | Admit: 2012-05-03 | Discharge: 2012-05-03 | Disposition: A | Discharge status: Home / Self Care | Payer: Medicare Other | Source: Ambulatory Visit | Attending: Cardiovascular Disease | Admitting: Cardiovascular Disease

## 2012-05-03 DIAGNOSIS — I7100 Dissection of unspecified site of aorta: Secondary | ICD-10-CM

## 2012-05-03 MED ORDER — IOHEXOL 350 MG/ML SOLN
100.0000 mL | Freq: Once | INTRAVENOUS | Status: AC | PRN
  Administered 2012-05-03: 100 mL via INTRAVENOUS

## 2012-12-24 ENCOUNTER — Encounter: Payer: Self-pay | Admitting: *Deleted

## 2013-01-05 ENCOUNTER — Telehealth: Payer: Self-pay | Admitting: Cardiovascular Disease

## 2013-01-05 DIAGNOSIS — Z79899 Other long term (current) drug therapy: Secondary | ICD-10-CM

## 2013-01-05 DIAGNOSIS — E782 Mixed hyperlipidemia: Secondary | ICD-10-CM

## 2013-01-05 NOTE — Telephone Encounter (Signed)
Labs ordered and lab slip mailed.  Returned call and informed Jasmine December.  Verbalized understanding.

## 2013-01-05 NOTE — Telephone Encounter (Signed)
[name] is needing a lab order before her appt on 01/12/13 with Dr.Croitoru.    Thanks

## 2013-01-08 LAB — CBC
MCHC: 34.9 g/dL (ref 30.0–36.0)
RDW: 13.9 % (ref 11.5–15.5)

## 2013-01-08 LAB — COMPREHENSIVE METABOLIC PANEL
AST: 27 U/L (ref 0–37)
Albumin: 4.4 g/dL (ref 3.5–5.2)
Alkaline Phosphatase: 95 U/L (ref 39–117)
Potassium: 4.5 mEq/L (ref 3.5–5.3)
Sodium: 140 mEq/L (ref 135–145)
Total Protein: 6.8 g/dL (ref 6.0–8.3)

## 2013-01-08 LAB — LIPID PANEL: LDL Cholesterol: 258 mg/dL — ABNORMAL HIGH (ref 0–99)

## 2013-01-11 ENCOUNTER — Encounter: Payer: Self-pay | Admitting: Cardiovascular Disease

## 2013-01-12 ENCOUNTER — Ambulatory Visit (INDEPENDENT_AMBULATORY_CARE_PROVIDER_SITE_OTHER): Payer: Medicare Other | Admitting: Cardiovascular Disease

## 2013-01-12 ENCOUNTER — Encounter: Payer: Self-pay | Admitting: Cardiovascular Disease

## 2013-01-12 VITALS — BP 138/82 | HR 81 | Resp 20 | Ht 62.0 in | Wt 151.4 lb

## 2013-01-12 DIAGNOSIS — I7101 Dissection of thoracic aorta: Secondary | ICD-10-CM

## 2013-01-12 DIAGNOSIS — E782 Mixed hyperlipidemia: Secondary | ICD-10-CM

## 2013-01-12 DIAGNOSIS — Z789 Other specified health status: Secondary | ICD-10-CM

## 2013-01-12 DIAGNOSIS — I71019 Dissection of thoracic aorta, unspecified: Secondary | ICD-10-CM

## 2013-01-12 DIAGNOSIS — I2510 Atherosclerotic heart disease of native coronary artery without angina pectoris: Secondary | ICD-10-CM

## 2013-01-12 MED ORDER — EZETIMIBE 10 MG PO TABS: 10.0000 mg | ORAL_TABLET | Freq: Every day | ORAL | Status: DC

## 2013-01-12 NOTE — Patient Instructions (Addendum)
START ZETIA 10MG  DAILY.  HAVE LABWORK DONE IN 3 MONTHS (LATE October 2014) - FASTING.  Your physician recommends that you schedule a follow-up appointment in: 6 MONTHS.

## 2013-01-13 ENCOUNTER — Encounter: Payer: Self-pay | Admitting: Cardiovascular Disease

## 2013-01-13 DIAGNOSIS — I2510 Atherosclerotic heart disease of native coronary artery without angina pectoris: Secondary | ICD-10-CM | POA: Insufficient documentation

## 2013-01-13 DIAGNOSIS — E782 Mixed hyperlipidemia: Secondary | ICD-10-CM | POA: Insufficient documentation

## 2013-01-13 DIAGNOSIS — Z789 Other specified health status: Secondary | ICD-10-CM | POA: Insufficient documentation

## 2013-01-13 DIAGNOSIS — I71012 Dissection of descending thoracic aorta: Secondary | ICD-10-CM | POA: Insufficient documentation

## 2013-01-13 DIAGNOSIS — I7101 Dissection of thoracic aorta: Secondary | ICD-10-CM | POA: Insufficient documentation

## 2013-01-13 NOTE — Progress Notes (Signed)
Patient ID: [name], female   DOB: 20-Apr-1928, 79 y.o.   MRN: [id number]     Reason for office visit CAD and mixed hyperlipidemia and aortic dissection followup   She initially presented with an acute myocardial infarction in May of 2009. Cardiac catheterization showed culprit lesions to be in the right coronary artery and she received 2 drug-eluting stents to this vessel (mid RCA and ostial PDA). She also had high-grade stenosis in the proximal LAD and was brought in for a stage procedure later on the same admission. 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 The procedure was aborted and she subsequently underwent bypass surgery. She has done well since that time without any new coronary events. She does not smoke, does not have hypertension and does not have diabetes mellitus but has severe mixed hyperlipidemia and is intolerant to statins. Previous treatment with Vytorin Lipitor daily Crestor and even once weekly Crestor was not tolerated due to weakness.  She absolutely refuses to try any type of statin again. She repeatedly states that she is doing just fine and that she feels great. She is worried that if she develops any weakness she will be unable to take care of her disabled husband.    Allergies  Allergen Reactions  . Lipitor (Atorvastatin)   . Simvastatin     Current Outpatient Prescriptions  Medication Sig Dispense Refill  . aspirin EC 81 MG tablet Take 81 mg by mouth daily.      . Chromium-Cinnamon 50-500 MCG-MG CAPS Take 1 capsule by mouth daily.      . clopidogrel (PLAVIX) 75 MG tablet Take 75 mg by mouth daily.      . fish oil-omega-3 fatty acids 1000 MG capsule Take 2 g by mouth daily.      . Multiple Vitamin (MULTIVITAMIN) tablet Take 1 tablet by mouth daily.      . nitroGLYCERIN (NITROLINGUAL) 0.4 MG/SPRAY spray Place 1 spray  under the tongue every 5 (five) minutes as needed for chest pain.      . Plant Sterols and Stanols (CHOLESTOFF PLUS) 450 MG CAPS Take 2 capsules by mouth daily.      Marland Kitchen ezetimibe (ZETIA) 10 MG tablet Take 1 tablet (10 mg total) by mouth daily.  90 tablet  3   No current facility-administered medications for this visit.    Past Medical History  Diagnosis Date  . CAD (coronary artery disease)   . 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  . History of acute anterior wall MI 11/19/2007  . H/O aortic dissection     Type B  . Dyslipidemia   . Carotid atherosclerosis     Past Surgical History  Procedure Laterality Date  . Coronary artery bypass graft  11/30/2007    LIMA to LAD,SVG to 1st and 2nd diagonal arteries  . Coronary angioplasty with stent placement  11/21/2007    stenting of the RCA followed by staged LAD intervention  . Nm myoview ltd  07/13/2010    normal    Family History  Problem Relation Age of Onset  . Diabetes Mother   . Heart failure Mother   . Stroke Mother   . Heart failure Father     History   Social History  . Marital Status: Unknown    Spouse Name:  N/A    Number of Children: N/A  . Years of Education: N/A   Occupational History  . Not on file.   Social History Main Topics  . Smoking status: Never Smoker   . Smokeless tobacco: Never Used  . Alcohol Use: No  . Drug Use: No  . Sexually Active: Not on file   Other Topics Concern  . Not on file   Social History Narrative  . No narrative on file    Review of systems: The patient specifically denies any chest pain at rest or with exertion, dyspnea at rest or with exertion, orthopnea, paroxysmal nocturnal dyspnea, syncope, palpitations, focal neurological deficits, intermittent claudication, lower extremity edema, unexplained weight gain, cough, hemoptysis or wheezing.  The patient also denies abdominal pain, nausea, vomiting, dysphagia, diarrhea, constipation,  polyuria, polydipsia, dysuria, hematuria, frequency, urgency, abnormal bleeding or bruising, fever, chills, unexpected weight changes, mood swings, change in skin or hair texture, change in voice quality, auditory or visual problems, allergic reactions or rashes, new musculoskeletal complaints other than usual "aches and pains".   PHYSICAL EXAM BP 138/82  Pulse 81  Resp 20  Ht 5\' 2"  (1.575 m)  Wt 151 lb 6.4 oz (68.675 kg)  BMI 27.68 kg/m2  General: Alert, oriented x3, no distress, moderately overweight Head: no evidence of trauma, PERRL, EOMI, no exophtalmos or lid lag, no myxedema, no xanthelasma; normal ears, nose and oropharynx Neck: normal jugular venous pulsations and no hepatojugular reflux; brisk carotid pulses without delay and no carotid bruits Chest: clear to auscultation, no signs of consolidation by percussion or palpation, normal fremitus, symmetrical and full respiratory excursions, healed sternotomy scar Cardiovascular: normal position and quality of the apical impulse, regular rhythm, normal first and second heart sounds, no murmurs, rubs or gallops Abdomen: no tenderness or distention, no masses by palpation, no abnormal pulsatility or arterial bruits, normal bowel sounds, no hepatosplenomegaly Extremities: no clubbing, cyanosis or edema; 2+ radial, ulnar and brachial pulses bilaterally; 2+ right femoral, posterior tibial and dorsalis pedis pulses; 2+ left femoral, posterior tibial and dorsalis pedis pulses; no subclavian or femoral bruits Neurological: grossly nonfocal   EKG: Sinus rhythm with nonspecific T wave abnormality and early R-wave transition in lead V2 both unchanged from previous tracings  Lipid Panel     Component Value Date/Time   CHOL 357* 01/08/2013 1055   TRIG 258* 01/08/2013 1055   HDL 47 01/08/2013 1055   CHOLHDL 7.6 01/08/2013 1055   VLDL 52* 01/08/2013 1055   LDLCALC 258* 01/08/2013 1055    BMET    Component Value Date/Time   NA 140 01/08/2013 1055     K 4.5 01/08/2013 1055   CL 106 01/08/2013 1055   CO2 26 01/08/2013 1055   GLUCOSE 104* 01/08/2013 1055   BUN 14 01/08/2013 1055   CREATININE 0.67 01/08/2013 1055   CREATININE 0.68 12/02/2007 0417   CALCIUM 9.4 01/08/2013 1055   GFRNONAA >60 12/02/2007 0417   GFRAA  Value: >60        The eGFR has been calculated using the MDRD equation. This calculation has not been validated in all clinical 12/02/2007 0417     ASSESSMENT AND PLAN CAD (coronary artery disease)  Currently asymptomatic but the concern is that she will eventually develop progression of disease as her major risk factor, the severe hypercholesterolemia, is not being adequately treated. She is vehemently opposed to starting any other type of statin treatment. I have recommended Zetia 10 mg once daily. This will not provide sufficient  reduction in her cholesterol. If she does not develop side effects will then add WelChol. Unfortunately treatment with a resin may increase her already elevated triglycerides. However I believe the elevated LDL cholesterol is the most important target. Dietary and lifestyle changes were again reviewed with the patient.  Mixed hyperlipidemia We'll start Zetia today, recheck lipid profile in 3 months and plan to add WelChol unless she develops side effects.  Statin intolerance    Dissecting aneurysm of thoracic aorta, Stanford type B This is asymptomatic. I am not certain whether this was an incidental discovery at the time of cardiac catheterization or a complication of that procedure. She has had no evidence of organ ischemia the we reevaluated the dissection by CT angiography in 2013 and it is virtually completely resolved, with a small residual flap noted just at the origin of the right common iliac artery small residual false lumen still involves the inferior left renal artery and the inferior mesenteric artery. This should be kept in mind if she requires angiography in the future. It might be preferable to  perform angiography via left radial approach which would allow cannulation of the internal mammary bypass.  Orders Placed This Encounter  Procedures  . Lipid Profile  . Comp Met (CMET)  . EKG 12-Lead   Meds ordered this encounter  Medications  . aspirin EC 81 MG tablet    Sig: Take 81 mg by mouth daily.  . Chromium-Cinnamon 50-500 MCG-MG CAPS    Sig: Take 1 capsule by mouth daily.  . Plant Sterols and Stanols (CHOLESTOFF PLUS) 450 MG CAPS    Sig: Take 2 capsules by mouth daily.  Marland Kitchen ezetimibe (ZETIA) 10 MG tablet    Sig: Take 1 tablet (10 mg total) by mouth daily.    Dispense:  90 tablet    Refill:  3    Yancarlos Berthold  Thurmon Fair, MD, [facility] [phone] office [phone] pager

## 2013-01-13 NOTE — Assessment & Plan Note (Signed)
Currently asymptomatic but the concern is that she will eventually develop progression of disease as her major risk factor, the severe hypercholesterolemia, is not being adequately treated. She is vehemently opposed to starting any other type of statin treatment. I have recommended Zetia 10 mg once daily. This will not provide sufficient reduction in her cholesterol. If she does not develop side effects will then add WelChol. Unfortunately treatment with a resin may increase her already elevated triglycerides. However I believe the elevated LDL cholesterol is the most important target. Dietary and lifestyle changes were again reviewed with the patient.

## 2013-01-13 NOTE — Assessment & Plan Note (Signed)
This is asymptomatic. I am not certain whether this was an incidental discovery at the time of cardiac catheterization or a complication of that procedure. She has had no evidence of organ ischemia the we reevaluated the dissection by CT angiography in 2013 and it is virtually completely resolved, with a small residual flap noted just at the origin of the right common iliac artery small residual false lumen still involves the inferior left renal artery and the inferior mesenteric artery. This should be kept in mind if she requires angiography in the future. It might be preferable to perform angiography via left radial approach which would allow cannulation of the internal mammary bypass.

## 2013-01-13 NOTE — Assessment & Plan Note (Signed)
We'll start Zetia today, recheck lipid profile in 3 months and plan to add WelChol unless she develops side effects.

## 2013-04-18 ENCOUNTER — Other Ambulatory Visit: Payer: Self-pay | Admitting: Cardiovascular Disease

## 2013-04-18 LAB — CBC
HCT: 41.7 % (ref 36.0–46.0)
Hemoglobin: 14.4 g/dL (ref 12.0–15.0)
MCH: 30.9 pg (ref 26.0–34.0)
RBC: 4.66 MIL/uL (ref 3.87–5.11)
RDW: 13.8 % (ref 11.5–15.5)
WBC: 5.8 10*3/uL (ref 4.0–10.5)

## 2013-04-18 LAB — COMPREHENSIVE METABOLIC PANEL
AST: 26 U/L (ref 0–37)
Alkaline Phosphatase: 80 U/L (ref 39–117)
BUN: 16 mg/dL (ref 6–23)
Creat: 0.65 mg/dL (ref 0.50–1.10)
Glucose, Bld: 105 mg/dL — ABNORMAL HIGH (ref 70–99)
Potassium: 4.8 mEq/L (ref 3.5–5.3)
Total Bilirubin: 1.1 mg/dL (ref 0.3–1.2)

## 2013-04-18 LAB — LIPID PANEL
Cholesterol: 278 mg/dL — ABNORMAL HIGH (ref 0–200)
HDL: 55 mg/dL (ref >39)
Total CHOL/HDL Ratio: 5.1 Ratio
VLDL: 30 mg/dL (ref 0–40)

## 2013-04-19 ENCOUNTER — Other Ambulatory Visit: Payer: Self-pay | Admitting: *Deleted

## 2013-04-19 NOTE — Telephone Encounter (Signed)
Lab results called to patient. She will now get her zetia refilled.Marland Kitchen

## 2013-06-16 ENCOUNTER — Other Ambulatory Visit: Payer: Self-pay | Admitting: Cardiovascular Disease

## 2013-06-18 NOTE — Telephone Encounter (Signed)
Rx was sent to pharmacy electronically. 

## 2013-07-27 ENCOUNTER — Encounter: Payer: Self-pay | Admitting: Cardiovascular Disease

## 2013-07-27 ENCOUNTER — Ambulatory Visit (INDEPENDENT_AMBULATORY_CARE_PROVIDER_SITE_OTHER): Payer: Medicare Other | Admitting: Cardiovascular Disease

## 2013-07-27 VITALS — BP 118/70 | HR 83 | Ht 61.0 in | Wt 144.8 lb

## 2013-07-27 DIAGNOSIS — I2510 Atherosclerotic heart disease of native coronary artery without angina pectoris: Secondary | ICD-10-CM

## 2013-07-27 DIAGNOSIS — Z79899 Other long term (current) drug therapy: Secondary | ICD-10-CM

## 2013-07-27 DIAGNOSIS — E782 Mixed hyperlipidemia: Secondary | ICD-10-CM

## 2013-07-27 MED ORDER — EZETIMIBE 10 MG PO TABS: 10.0000 mg | ORAL_TABLET | Freq: Every day | ORAL | Status: DC

## 2013-07-27 MED ORDER — NITROGLYCERIN 0.4 MG/SPRAY TL SOLN: 1.0000 | Status: DC | PRN

## 2013-07-27 MED ORDER — CLOPIDOGREL BISULFATE 75 MG PO TABS: 75.0000 mg | ORAL_TABLET | Freq: Once | ORAL | Status: DC

## 2013-07-27 NOTE — Patient Instructions (Signed)
Refills have been sent to your pharmacy for Nitroglycerin, Plavix and Zetia.  Your physician recommends that you return for lab work in: prior to your 6 month check-up FASTING.   Your physician recommends that you schedule a follow-up appointment in: 6 months.

## 2013-08-04 NOTE — Progress Notes (Signed)
Patient ID: [name], female   DOB: [birth year], 80 y.o.   MRN: [id number]     Reason for office visit CAD, hyperlipidemia  [name] has extensive CAD and severe hypercholesterolemia. Intolerant to numerous statins. Treatment with Zetia monotherapy seems to be well tolerated, but has produced the expected (insufficient) LDL reduction. She is asymptomatic.   Her life revolves around caring for her husband who has dementia. She has to help him with all ADLs.  She initially presented with an acute myocardial infarction in May of 2009. Cardiac catheterization showed culprit lesions to be in the right coronary artery and she received 2 drug-eluting stents to this vessel (mid RCA and ostial PDA). She also had high-grade stenosis in the proximal LAD and was brought in for a stage procedure later on the same admission. 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 The procedure was aborted and she subsequently underwent bypass surgery. She has done well since that time without any new coronary events. She does not smoke, does not have hypertension and does not have diabetes mellitus but has severe mixed hyperlipidemia and is intolerant to statins. Previous treatment with Vytorin Lipitor daily Crestor and even once weekly Crestor was not tolerated due to weakness.  She absolutely refuses to try any type of statin again. She repeatedly states that she is doing just fine and that she feels great. She is worried that if she develops any weakness she will be unable to take care of her disabled husband.    Allergies  Allergen Reactions  . Lipitor [Atorvastatin]   . Simvastatin     Current Outpatient Prescriptions  Medication Sig Dispense Refill  . aspirin EC 81 MG tablet Take 81 mg by mouth daily.      . Chromium-Cinnamon 50-500 MCG-MG CAPS Take 1 capsule by mouth  daily.      . clopidogrel (PLAVIX) 75 MG tablet Take 1 tablet (75 mg total) by mouth once.  90 tablet  3  . ezetimibe (ZETIA) 10 MG tablet Take 1 tablet (10 mg total) by mouth daily.  90 tablet  3  . fish oil-omega-3 fatty acids 1000 MG capsule Take 2 g by mouth daily.      . Multiple Vitamin (MULTIVITAMIN) tablet Take 1 tablet by mouth daily.      . nitroGLYCERIN (NITROLINGUAL) 0.4 MG/SPRAY spray Place 1 spray under the tongue every 5 (five) minutes as needed for chest pain.  25 g  6   No current facility-administered medications for this visit.    Past Medical History  Diagnosis Date  . CAD (coronary artery disease)   . 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  . History of acute anterior wall MI 11/19/2007  . H/O aortic dissection     Type B  . Dyslipidemia   . Carotid atherosclerosis     Past Surgical History  Procedure Laterality Date  . Coronary artery bypass graft  11/30/2007    LIMA to LAD,SVG to 1st and 2nd diagonal arteries  . Coronary angioplasty with stent placement  11/21/2007    stenting of the RCA followed by staged LAD intervention  . Nm myoview ltd  07/13/2010    normal    Family History  Problem Relation Age of Onset  . Diabetes Mother   . Heart failure  Mother   . Stroke Mother   . Heart failure Father     History   Social History  . Marital Status: Unknown    Spouse Name: N/A    Number of Children: N/A  . Years of Education: N/A   Occupational History  . Not on file.   Social History Main Topics  . Smoking status: Never Smoker   . Smokeless tobacco: Never Used  . Alcohol Use: No  . Drug Use: No  . Sexual Activity: Not on file   Other Topics Concern  . Not on file   Social History Narrative  . No narrative on file    Review of systems: The patient specifically denies any chest pain at rest or with exertion, dyspnea at rest or with exertion, orthopnea, paroxysmal nocturnal dyspnea, syncope, palpitations,  focal neurological deficits, intermittent claudication, lower extremity edema, unexplained weight gain, cough, hemoptysis or wheezing.  The patient also denies abdominal pain, nausea, vomiting, dysphagia, diarrhea, constipation, polyuria, polydipsia, dysuria, hematuria, frequency, urgency, abnormal bleeding or bruising, fever, chills, unexpected weight changes, mood swings, change in skin or hair texture, change in voice quality, auditory or visual problems, allergic reactions or rashes, new musculoskeletal complaints other than usual "aches and pains".   PHYSICAL EXAM BP 118/70  Pulse 83  Ht 5' 1" (1.549 m)  Wt 65.681 kg (144 lb 12.8 oz)  BMI 27.37 kg/m2 General: Alert, oriented x3, no distress, moderately overweight  Head: no evidence of trauma, PERRL, EOMI, no exophtalmos or lid lag, no myxedema, no xanthelasma; normal ears, nose and oropharynx  Neck: normal jugular venous pulsations and no hepatojugular reflux; brisk carotid pulses without delay and no carotid bruits  Chest: clear to auscultation, no signs of consolidation by percussion or palpation, normal fremitus, symmetrical and full respiratory excursions, healed sternotomy scar  Cardiovascular: normal position and quality of the apical impulse, regular rhythm, normal first and second heart sounds, no murmurs, rubs or gallops  Abdomen: no tenderness or distention, no masses by palpation, no abnormal pulsatility or arterial bruits, normal bowel sounds, no hepatosplenomegaly  Extremities: no clubbing, cyanosis or edema; 2+ radial, ulnar and brachial pulses bilaterally; 2+ right femoral, posterior tibial and dorsalis pedis pulses; 2+ left femoral, posterior tibial and dorsalis pedis pulses; no subclavian or femoral bruits  Neurological: grossly nonfocal   EKG: Sinus rhythm with nonspecific T wave abnormality and early R-wave transition in lead V2 both unchanged from previous tracings   Lipid Panel     Component Value Date/Time   CHOL  278* 04/18/2013 1031   TRIG 148 04/18/2013 1031   HDL 55 04/18/2013 1031   CHOLHDL 5.1 04/18/2013 1031   VLDL 30 04/18/2013 1031   LDLCALC 193* 04/18/2013 1031    BMET    Component Value Date/Time   NA 141 04/18/2013 1031   K 4.8 04/18/2013 1031   CL 105 04/18/2013 1031   CO2 30 04/18/2013 1031   GLUCOSE 105* 04/18/2013 1031   BUN 16 04/18/2013 1031   CREATININE 0.65 04/18/2013 1031   CREATININE 0.68 12/02/2007 0417   CALCIUM 9.3 04/18/2013 1031   GFRNONAA >60 12/02/2007 0417   GFRAA  Value: >60        The eGFR has been calculated using the MDRD equation. This calculation has not been validated in all clinical 12/02/2007 0417     ASSESSMENT AND PLAN  Despite substantial reduction in LDL levels with Zetia, as expected she is nowhere near the desirable range. She refuses to consider trying  any statin again. When PSCK9 inhibitors become available, she would be a prime candidate for therapy.  Patient Instructions  Refills have been sent to your pharmacy for Nitroglycerin, Plavix and Zetia.  Your physician recommends that you return for lab work in: prior to your 6 month check-up FASTING.   Your physician recommends that you schedule a follow-up appointment in: 6 months.       Orders Placed This Encounter  Procedures  . Lipid panel  . Comprehensive metabolic panel  . EKG 12-Lead   Meds ordered this encounter  Medications  . clopidogrel (PLAVIX) 75 MG tablet    Sig: Take 1 tablet (75 mg total) by mouth once.    Dispense:  90 tablet    Refill:  3  . ezetimibe (ZETIA) 10 MG tablet    Sig: Take 1 tablet (10 mg total) by mouth daily.    Dispense:  90 tablet    Refill:  3  . nitroGLYCERIN (NITROLINGUAL) 0.4 MG/SPRAY spray    Sig: Place 1 spray under the tongue every 5 (five) minutes as needed for chest pain.    Dispense:  25 g    Refill:  [place] Dra[place] Maese, MD, [facility] [phone] office [phone] pager

## 2013-12-14 ENCOUNTER — Telehealth: Payer: Self-pay | Admitting: Cardiovascular Disease

## 2013-12-14 NOTE — Telephone Encounter (Signed)
Pt has appt w/ dr c in august.  Does she need labwork and if so, please mail labslip.

## 2013-12-14 NOTE — Telephone Encounter (Signed)
Returned call to patient. Spoke with son/daughter. Dr. Loletha Grayer had orders fasting labs to be done prior office visit with Dr. Loletha Grayer in 6 months. Lab slips printed and mailed to verified address.

## 2014-02-11 ENCOUNTER — Other Ambulatory Visit: Payer: Self-pay | Admitting: Cardiovascular Disease

## 2014-02-11 LAB — LIPID PANEL
CHOL/HDL RATIO: 4.8 ratio
CHOLESTEROL: 256 mg/dL — AB (ref 0–200)
HDL: 53 mg/dL (ref >39)
LDL Cholesterol: 170 mg/dL — ABNORMAL HIGH (ref 0–99)
TRIGLYCERIDES: 164 mg/dL — AB (ref <150)
VLDL: 33 mg/dL (ref 0–40)

## 2014-02-11 LAB — COMPREHENSIVE METABOLIC PANEL
ALT: 30 U/L (ref 0–35)
AST: 30 U/L (ref 0–37)
Albumin: 4.3 g/dL (ref 3.5–5.2)
Alkaline Phosphatase: 76 U/L (ref 39–117)
BILIRUBIN TOTAL: 1 mg/dL (ref 0.2–1.2)
BUN: 13 mg/dL (ref 6–23)
CO2: 26 meq/L (ref 19–32)
CREATININE: 0.67 mg/dL (ref 0.50–1.10)
Calcium: 9 mg/dL (ref 8.4–10.5)
Chloride: 108 mEq/L (ref 96–112)
Glucose, Bld: 100 mg/dL — ABNORMAL HIGH (ref 70–99)
Potassium: 4.2 mEq/L (ref 3.5–5.3)
SODIUM: 142 meq/L (ref 135–145)
TOTAL PROTEIN: 6.7 g/dL (ref 6.0–8.3)

## 2014-02-14 ENCOUNTER — Telehealth: Payer: Self-pay | Admitting: *Deleted

## 2014-02-14 ENCOUNTER — Encounter: Payer: Self-pay | Admitting: Cardiovascular Disease

## 2014-02-14 ENCOUNTER — Ambulatory Visit (INDEPENDENT_AMBULATORY_CARE_PROVIDER_SITE_OTHER): Payer: Medicare Other | Admitting: Cardiovascular Disease

## 2014-02-14 VITALS — BP 112/76 | HR 77 | Resp 16 | Ht 61.0 in | Wt 142.5 lb

## 2014-02-14 DIAGNOSIS — Z789 Other specified health status: Secondary | ICD-10-CM

## 2014-02-14 DIAGNOSIS — Z79899 Other long term (current) drug therapy: Secondary | ICD-10-CM

## 2014-02-14 DIAGNOSIS — E782 Mixed hyperlipidemia: Secondary | ICD-10-CM

## 2014-02-14 DIAGNOSIS — I2510 Atherosclerotic heart disease of native coronary artery without angina pectoris: Secondary | ICD-10-CM

## 2014-02-14 MED ORDER — CLOPIDOGREL BISULFATE 75 MG PO TABS: 75.0000 mg | ORAL_TABLET | Freq: Once | ORAL | Status: DC

## 2014-02-14 MED ORDER — ALIROCUMAB 75 MG/ML ~~LOC~~ SOPN: 75.0000 mg | PEN_INJECTOR | SUBCUTANEOUS | Status: DC

## 2014-02-14 MED ORDER — NITROGLYCERIN 0.4 MG/SPRAY TL SOLN: 1.0000 | Status: DC | PRN

## 2014-02-14 NOTE — Patient Instructions (Signed)
Continue Zetia until the Praluent has arrived.  Your physician recommends that you return for lab work in: 2 months after starting [place].  Dr. Sallyanne Kuster recommends that you schedule a follow-up appointment in: Call when you receive the medication and make an appointment with Dr. Loletha Grayer 2 1/2 months after your first dose.

## 2014-02-14 NOTE — Telephone Encounter (Signed)
Enrollment forms faxed to [employer] [phone].

## 2014-02-15 ENCOUNTER — Encounter: Payer: Self-pay | Admitting: Cardiovascular Disease

## 2014-02-15 NOTE — Progress Notes (Signed)
Patient ID: [name], female   DOB: Jun 21, 1928, 80 y.o.   MRN: [id number]     Reason for office visit CAD, severe hypercholesterolemia  [name] has extensive CAD and severe hypercholesterolemia. Intolerant to numerous statins. Treatment with Zetia monotherapy seems to be well tolerated, but has produced insufficient LDL reduction.   Her life revolves around caring for her husband who has dementia. She has to help him with all ADLs. She has problems with left-sided sciatica, but no cardiovascular complaints  She initially presented with an acute myocardial infarction in May of 2009. Cardiac catheterization showed culprit lesions to be in the right coronary artery and she received 2 drug-eluting stents to this vessel (mid RCA and ostial PDA). She also had high-grade stenosis in the proximal LAD and was brought in for a stage procedure later on the same admission. 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 The procedure was aborted and she subsequently underwent bypass surgery. She has done well since that time without any new coronary events. She does not smoke, does not have hypertension and does not have diabetes mellitus but has severe mixed hyperlipidemia and is intolerant to statins. Previous treatment with Vytorin, Lipitor daily Crestor and even once weekly Crestor was not tolerated due to weakness.   We reviewed her updated lipid profile. She has been trying to improve her diet and this shows. Monotherapy with Zetia has lead to a 30% reduction in LDL cholesterol which is quite remarkable, but her LDL is still severely elevated at 170. We discussed the novel PCSK 9 inhibitors and she is willing to give it a try. 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 She fits the profile of the new drugs very well.   Allergies  Allergen Reactions  . Lipitor [Atorvastatin]   . Simvastatin     Current Outpatient Prescriptions  Medication Sig Dispense Refill  . aspirin EC 81 MG tablet Take 81 mg by mouth daily.      . Chromium-Cinnamon 50-500 MCG-MG CAPS Take 1 capsule by mouth daily.      . clopidogrel (PLAVIX) 75 MG tablet Take 1 tablet (75 mg total) by mouth once.  90 tablet  3  . ezetimibe (ZETIA) 10 MG tablet Take 1 tablet (10 mg total) by mouth daily.  90 tablet  3  . fish oil-omega-3 fatty acids 1000 MG capsule Take 2 g by mouth daily.      . Multiple Vitamin (MULTIVITAMIN) tablet Take 1 tablet by mouth daily.      . nitroGLYCERIN (NITROLINGUAL) 0.4 MG/SPRAY spray Place 1 spray under the tongue every 5 (five) minutes as needed for chest pain.  25 g  6  . Alirocumab (PRALUENT) 75 MG/ML SOPN Inject 75 mg into the skin every 14 (fourteen) days.  1 mL  5   No current facility-administered medications for this visit.    Past Medical History  Diagnosis Date  . CAD (coronary artery disease)   . 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  . History of acute anterior wall MI 11/19/2007  . H/O aortic dissection     Type B  . Dyslipidemia   . Carotid atherosclerosis     Past Surgical  History  Procedure Laterality Date  . Coronary artery bypass graft  11/30/2007    LIMA to LAD,SVG to 1st and 2nd diagonal arteries  . Coronary angioplasty with stent placement  11/21/2007    stenting of the RCA followed by staged LAD intervention  . Nm myoview ltd  07/13/2010    normal    Family History  Problem Relation Age of Onset  . Diabetes Mother   . Heart failure Mother   . Stroke Mother   . Heart failure Father     History   Social History  . Marital Status: Unknown    Spouse Name: N/A    Number of Children: N/A  . Years of Education: N/A   Occupational History  . Not on file.    Social History Main Topics  . Smoking status: Never Smoker   . Smokeless tobacco: Never Used  . Alcohol Use: No  . Drug Use: No  . Sexual Activity: Not on file   Other Topics Concern  . Not on file   Social History Narrative  . No narrative on file    Review of systems: The patient specifically denies any chest pain at rest or with exertion, dyspnea at rest or with exertion, orthopnea, paroxysmal nocturnal dyspnea, syncope, palpitations, focal neurological deficits, intermittent claudication, lower extremity edema, unexplained weight gain, cough, hemoptysis or wheezing.  The patient also denies abdominal pain, nausea, vomiting, dysphagia, diarrhea, constipation, polyuria, polydipsia, dysuria, hematuria, frequency, urgency, abnormal bleeding or bruising, fever, chills, unexpected weight changes, mood swings, change in skin or hair texture, change in voice quality, auditory or visual problems, allergic reactions or rashes, new musculoskeletal complaints other than usual "aches and pains".   PHYSICAL EXAM BP 112/76  Pulse 77  Resp 16  Ht $R'5\' 1"'lU$  (1.549 m)  Wt 142 lb 8 oz (64.638 kg)  BMI 26.94 kg/m2 General: Alert, oriented x3, no distress, moderately overweight  Head: no evidence of trauma, PERRL, EOMI, no exophtalmos or lid lag, no myxedema, no xanthelasma; normal ears, nose and oropharynx  Neck: normal jugular venous pulsations and no hepatojugular reflux; brisk carotid pulses without delay and no carotid bruits  Chest: clear to auscultation, no signs of consolidation by percussion or palpation, normal fremitus, symmetrical and full respiratory excursions, healed sternotomy scar  Cardiovascular: normal position and quality of the apical impulse, regular rhythm, normal first and second heart sounds, no murmurs, rubs or gallops  Abdomen: no tenderness or distention, no masses by palpation, no abnormal pulsatility or arterial bruits, normal bowel sounds, no hepatosplenomegaly   Extremities: no clubbing, cyanosis or edema; 2+ radial, ulnar and brachial pulses bilaterally; 2+ right femoral, posterior tibial and dorsalis pedis pulses; 2+ left femoral, posterior tibial and dorsalis pedis pulses; no subclavian or femoral bruits  Neurological: grossly nonfocal    EKG: Sinus rhythm with nonspecific T wave abnormality and early R-wave transition in lead V2 both unchanged from previous tracings   Lipid Panel     Component Value Date/Time   CHOL 256* 02/11/2014 0959   TRIG 164* 02/11/2014 0959   HDL 53 02/11/2014 0959   CHOLHDL 4.8 02/11/2014 0959   VLDL 33 02/11/2014 0959   LDLCALC 170* 02/11/2014 0959    BMET    Component Value Date/Time   NA 142 02/11/2014 0959   K 4.2 02/11/2014 0959   CL 108 02/11/2014 0959   CO2 26 02/11/2014 0959   GLUCOSE 100* 02/11/2014 0959   BUN 13 02/11/2014 0959   CREATININE 0.67 02/11/2014 0959  CREATININE 0.68 12/02/2007 0417   CALCIUM 9.0 02/11/2014 0959   GFRNONAA >60 12/02/2007 0417   GFRAA  Value: >60        The eGFR has been calculated using the MDRD equation. This calculation has not been validated in all clinical 12/02/2007 0417     ASSESSMENT AND PLAN  We'll see if we can obtain Praluent From the manufacturer and get insurance company approval. Recheck lipids in 2-3 months. When she starts the new medication, stop treatment with Zetia. No symptoms of active coronary insufficiency at this time. No signs or symptoms of problems with her descending thoracic aortic dissection, which appeared to be almost completely resolved by last CT angiogram in 2013.  Orders Placed This Encounter  Procedures  . Comprehensive metabolic panel  . Lipid panel  . EKG 12-Lead   Meds ordered this encounter  Medications  . Alirocumab (PRALUENT) 75 MG/ML SOPN    Sig: Inject 75 mg into the skin every 14 (fourteen) days.    Dispense:  1 mL    Refill:  5  . clopidogrel (PLAVIX) 75 MG tablet    Sig: Take 1 tablet (75 mg total) by mouth once.    Dispense:   90 tablet    Refill:  3  . nitroGLYCERIN (NITROLINGUAL) 0.4 MG/SPRAY spray    Sig: Place 1 spray under the tongue every 5 (five) minutes as needed for chest pain.    Dispense:  25 g    Refill:  [place] Rene Gonsoulin, MD, [facility] [phone] office [phone] pager

## 2014-07-05 ENCOUNTER — Telehealth: Payer: Self-pay | Admitting: Cardiovascular Disease

## 2014-07-05 NOTE — Telephone Encounter (Signed)
Called, LMTCB 

## 2014-07-05 NOTE — Telephone Encounter (Signed)
Calling because her mom needs a lab sent out to her before her appt on 08/22/2014 at 1:45pm . Thanks

## 2014-07-08 NOTE — Telephone Encounter (Signed)
Called, LMTCB. Pt has lipid profile & bmet order pending for [facility] in advance of appt.

## 2014-07-08 NOTE — Telephone Encounter (Signed)
Spoke with pt's daughter, all concerns addressed.

## 2014-08-13 ENCOUNTER — Telehealth: Payer: Self-pay | Admitting: Cardiovascular Disease

## 2014-08-13 NOTE — Telephone Encounter (Signed)
Pt's daughter called in stating that the pt did not receive her lab orders in the mail and she going to have the blood drawn next week . So the daughter was requesting that the orders be sent to [facility] for her.   Thanks

## 2014-08-13 NOTE — Telephone Encounter (Signed)
Advised that [name] can view lab orders, they would not need paperwork printed for this. Caller voiced understanding.

## 2014-08-15 ENCOUNTER — Other Ambulatory Visit: Payer: Self-pay | Admitting: Cardiovascular Disease

## 2014-08-15 DIAGNOSIS — Z79899 Other long term (current) drug therapy: Secondary | ICD-10-CM | POA: Diagnosis not present

## 2014-08-15 DIAGNOSIS — E782 Mixed hyperlipidemia: Secondary | ICD-10-CM | POA: Diagnosis not present

## 2014-08-16 LAB — COMPREHENSIVE METABOLIC PANEL
ALT: 35 U/L (ref 0–35)
AST: 31 U/L (ref 0–37)
Albumin: 4.1 g/dL (ref 3.5–5.2)
Alkaline Phosphatase: 87 U/L (ref 39–117)
BILIRUBIN TOTAL: 1 mg/dL (ref 0.2–1.2)
BUN: 13 mg/dL (ref 6–23)
CO2: 27 mEq/L (ref 19–32)
Calcium: 8.8 mg/dL (ref 8.4–10.5)
Chloride: 107 mEq/L (ref 96–112)
Creat: 0.55 mg/dL (ref 0.50–1.10)
GLUCOSE: 88 mg/dL (ref 70–99)
Potassium: 4.4 mEq/L (ref 3.5–5.3)
Sodium: 142 mEq/L (ref 135–145)
TOTAL PROTEIN: 6.6 g/dL (ref 6.0–8.3)

## 2014-08-16 LAB — LIPID PANEL
CHOL/HDL RATIO: 4.7 ratio
Cholesterol: 276 mg/dL — ABNORMAL HIGH (ref 0–200)
HDL: 59 mg/dL (ref >39)
LDL Cholesterol: 187 mg/dL — ABNORMAL HIGH (ref 0–99)
Triglycerides: 151 mg/dL — ABNORMAL HIGH (ref <150)
VLDL: 30 mg/dL (ref 0–40)

## 2014-08-19 ENCOUNTER — Ambulatory Visit (INDEPENDENT_AMBULATORY_CARE_PROVIDER_SITE_OTHER): Payer: Medicare Other | Admitting: Cardiovascular Disease

## 2014-08-19 VITALS — BP 146/90 | HR 86 | Resp 16 | Ht 61.2 in | Wt 143.6 lb

## 2014-08-19 DIAGNOSIS — Z789 Other specified health status: Secondary | ICD-10-CM

## 2014-08-19 DIAGNOSIS — E782 Mixed hyperlipidemia: Secondary | ICD-10-CM

## 2014-08-19 DIAGNOSIS — I2510 Atherosclerotic heart disease of native coronary artery without angina pectoris: Secondary | ICD-10-CM

## 2014-08-19 DIAGNOSIS — Z79899 Other long term (current) drug therapy: Secondary | ICD-10-CM

## 2014-08-19 DIAGNOSIS — I7101 Dissection of thoracic aorta: Secondary | ICD-10-CM

## 2014-08-19 DIAGNOSIS — E785 Hyperlipidemia, unspecified: Secondary | ICD-10-CM

## 2014-08-19 DIAGNOSIS — Z889 Allergy status to unspecified drugs, medicaments and biological substances status: Secondary | ICD-10-CM

## 2014-08-19 DIAGNOSIS — I71012 Dissection of descending thoracic aorta: Secondary | ICD-10-CM

## 2014-08-19 MED ORDER — EZETIMIBE 10 MG PO TABS: 10.0000 mg | ORAL_TABLET | Freq: Every day | ORAL | Status: DC

## 2014-08-19 MED ORDER — CLOPIDOGREL BISULFATE 75 MG PO TABS: 75.0000 mg | ORAL_TABLET | Freq: Once | ORAL | Status: DC

## 2014-08-19 MED ORDER — PRAVASTATIN SODIUM 40 MG PO TABS: 40.0000 mg | ORAL_TABLET | Freq: Every evening | ORAL | Status: DC

## 2014-08-19 NOTE — Patient Instructions (Signed)
START Pravastatin 40mg  daily -  take at bedtime.  Your physician recommends that you return for lab work in: 3 months (Mid to late May) Fasting.  Dr. Sallyanne Kuster recommends that you schedule a follow-up appointment in: 6 months.

## 2014-08-21 ENCOUNTER — Encounter: Payer: Self-pay | Admitting: Cardiovascular Disease

## 2014-08-21 NOTE — Progress Notes (Signed)
Patient ID: [name], female   DOB: Oct 19, 1927, 81 y.o.   MRN: [id number]      Reason for office visit CAD, severe hypercholesterolemia  [name] has extensive CAD and severe hypercholesterolemia. Intolerant to numerous statins. Treatment with Zetia monotherapy seems to be well tolerated, but has produced insufficient LDL reduction.  She remains deeply absorbed in 24 hour care for her husband who has dementia. She has to help him with all ADLs. She has problems with left-sided sciatica, but no cardiovascular complaints. She looks tired. She has interrupted sleep, always on the lookout for his needs. After we obtained approval for a PCSK9 inhibitor she changed her mind and "doesn't want to be a [place] pig". She is worried that side effects from the drug would make her incapable of providing appropriate care for her husband. None of my arguments (or her daughter's) seem to sway her.  She initially presented with an acute myocardial infarction in May of 2009. Cardiac catheterization showed culprit lesions to be in the right coronary artery and she received 2 drug-eluting stents to this vessel (mid RCA and ostial PDA). She also had high-grade stenosis in the proximal LAD and was brought in for a stage procedure later on the same admission. 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 The procedure was aborted and she subsequently underwent bypass surgery. She has done well since that time without any new coronary events. She does not smoke, does not have hypertension and does not have diabetes mellitus but has severe mixed hyperlipidemia and is intolerant to statins. Previous treatment with Vytorin, Lipitor daily Crestor and even once weekly Crestor was not tolerated due to weakness.    Allergies  Allergen Reactions  . Lipitor [Atorvastatin]   . Simvastatin       Current Outpatient Prescriptions  Medication Sig Dispense Refill  . aspirin EC 81 MG tablet Take 81 mg by mouth daily.    . Chromium-Cinnamon 50-500 MCG-MG CAPS Take 1 capsule by mouth daily.    . clopidogrel (PLAVIX) 75 MG tablet Take 1 tablet (75 mg total) by mouth once. 90 tablet 3  . ezetimibe (ZETIA) 10 MG tablet Take 1 tablet (10 mg total) by mouth daily. 90 tablet 3  . fish oil-omega-3 fatty acids 1000 MG capsule Take 2 g by mouth daily.    . Multiple Vitamin (MULTIVITAMIN) tablet Take 1 tablet by mouth daily.    . nitroGLYCERIN (NITROLINGUAL) 0.4 MG/SPRAY spray Place 1 spray under the tongue every 5 (five) minutes as needed for chest pain. 25 g 6  . pravastatin (PRAVACHOL) 40 MG tablet Take 1 tablet (40 mg total) by mouth every evening. 90 tablet 3   No current facility-administered medications for this visit.    Past Medical History  Diagnosis Date  . CAD (coronary artery disease)   . 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  . History of acute anterior wall MI 11/19/2007  . H/O aortic dissection     Type B  . Dyslipidemia   . Carotid atherosclerosis     Past Surgical History  Procedure Laterality Date  . Coronary artery bypass graft  11/30/2007    LIMA to LAD,SVG to 1st and 2nd diagonal arteries  . Coronary angioplasty with stent placement  11/21/2007    stenting of the RCA  followed by staged LAD intervention  . Nm myoview ltd  07/13/2010    normal    Family History  Problem Relation Age of Onset  . Diabetes Mother   . Heart failure Mother   . Stroke Mother   . Heart failure Father     History   Social History  . Marital Status: Unknown    Spouse Name: N/A  . Number of Children: N/A  . Years of Education: N/A   Occupational History  . Not on file.   Social History Main Topics  . Smoking status: Never Smoker   . Smokeless tobacco: Never Used  . Alcohol Use: No  . Drug Use: No  . Sexual Activity: Not on file    Other Topics Concern  . Not on file   Social History Narrative    Review of systems: The patient specifically denies any chest pain at rest or with exertion, dyspnea at rest or with exertion, orthopnea, paroxysmal nocturnal dyspnea, syncope, palpitations, focal neurological deficits, intermittent claudication, lower extremity edema, unexplained weight gain, cough, hemoptysis or wheezing.  The patient also denies abdominal pain, nausea, vomiting, dysphagia, diarrhea, constipation, polyuria, polydipsia, dysuria, hematuria, frequency, urgency, abnormal bleeding or bruising, fever, chills, unexpected weight changes, mood swings, change in skin or hair texture, change in voice quality, auditory or visual problems, allergic reactions or rashes, new musculoskeletal complaints other than usual "aches and pains".  PHYSICAL EXAM BP 146/90 mmHg  Pulse 86  Resp 16  Ht 5' 1.2" (1.554 m)  Wt 143 lb 9 oz (65.118 kg)  BMI 26.96 kg/m2  Rechecked BP 128/79 mm Hg General: Alert, oriented x3, no distress, moderately overweight  Head: no evidence of trauma, PERRL, EOMI, no exophtalmos or lid lag, no myxedema, no xanthelasma; normal ears, nose and oropharynx  Neck: normal jugular venous pulsations and no hepatojugular reflux; brisk carotid pulses without delay and no carotid bruits  Chest: clear to auscultation, no signs of consolidation by percussion or palpation, normal fremitus, symmetrical and full respiratory excursions, healed sternotomy scar  Cardiovascular: normal position and quality of the apical impulse, regular rhythm, normal first and second heart sounds, no murmurs, rubs or gallops  Abdomen: no tenderness or distention, no masses by palpation, no abnormal pulsatility or arterial bruits, normal bowel sounds, no hepatosplenomegaly  Extremities: no clubbing, cyanosis or edema; 2+ radial, ulnar and brachial pulses bilaterally; 2+ right femoral, posterior tibial and dorsalis pedis pulses; 2+  left femoral, posterior tibial and dorsalis pedis pulses; no subclavian or femoral bruits   EKG: NSR, PACs in trigeminy, some with aberrancy  Lipid Panel     Component Value Date/Time   CHOL 276* 08/15/2014 1126   TRIG 151* 08/15/2014 1126   HDL 59 08/15/2014 1126   CHOLHDL 4.7 08/15/2014 1126   VLDL 30 08/15/2014 1126   LDLCALC 187* 08/15/2014 1126    BMET    Component Value Date/Time   NA 142 08/15/2014 1126   K 4.4 08/15/2014 1126   CL 107 08/15/2014 1126   CO2 27 08/15/2014 1126   GLUCOSE 88 08/15/2014 1126   BUN 13 08/15/2014 1126   CREATININE 0.55 08/15/2014 1126   CREATININE 0.68 12/02/2007 0417   CALCIUM 8.8 08/15/2014 1126   GFRNONAA >60 12/02/2007 0417   GFRAA  12/02/2007 0417    >60        The eGFR has been calculated using the MDRD equation. This calculation has not been validated in all clinical     ASSESSMENT AND PLAN  She refuses to take Praluent., but we managed to convince her to try pravastatin in combination with zetia. I am doubtful it will bring her LDL to 70, but we might get it to <100.  No symptoms of active CAD.  No signs or symptoms of problems from her type B aortic dissection.  BP a little high when she was emotional, but better when rechecked.  I am very worried that she is wearing herself out physically. Asked her to get help with her husband, but she stubbornly refuses - she would be terrified they wouldn't care for him like she would. She does not want strangers in her house.   Orders Placed This Encounter  Procedures  . Lipid panel  . Hepatic function panel  . EKG 12-Lead   Meds ordered this encounter  Medications  . pravastatin (PRAVACHOL) 40 MG tablet    Sig: Take 1 tablet (40 mg total) by mouth every evening.    Dispense:  90 tablet    Refill:  3  . ezetimibe (ZETIA) 10 MG tablet    Sig: Take 1 tablet (10 mg total) by mouth daily.    Dispense:  90 tablet    Refill:  3  . clopidogrel (PLAVIX) 75 MG tablet    Sig:  Take 1 tablet (75 mg total) by mouth once.    Dispense:  90 tablet    Refill:  Lynn Yanci Bachtell, MD, [facility] [phone] office [phone] pager

## 2014-08-22 ENCOUNTER — Ambulatory Visit: Payer: Self-pay | Admitting: Cardiovascular Disease

## 2014-08-23 ENCOUNTER — Other Ambulatory Visit: Payer: Self-pay | Admitting: Cardiovascular Disease

## 2014-08-23 MED ORDER — NITROGLYCERIN 0.4 MG/SPRAY TL SOLN: 1.0000 | Status: DC | PRN

## 2014-08-23 NOTE — Telephone Encounter (Signed)
°  1. Which medications need to be refilled? nitroglycerin  2. Which pharmacy is medication to be sent to?[facility]-  3. Do they need a 30 day or 90 day supply? 90 and refills  4. Would they like a call back once the medication has been sent to the pharmacy? yes

## 2015-01-30 DIAGNOSIS — E785 Hyperlipidemia, unspecified: Secondary | ICD-10-CM | POA: Diagnosis not present

## 2015-01-31 LAB — LIPID PANEL
CHOL/HDL RATIO: 4.4 ratio (ref <=5.0)
CHOLESTEROL: 250 mg/dL — AB (ref 125–200)
HDL: 57 mg/dL (ref >=46)
LDL Cholesterol: 167 mg/dL — ABNORMAL HIGH (ref <130)
TRIGLYCERIDES: 129 mg/dL (ref <150)
VLDL: 26 mg/dL (ref <30)

## 2015-02-03 ENCOUNTER — Encounter: Payer: Self-pay | Admitting: Cardiovascular Disease

## 2015-02-03 ENCOUNTER — Ambulatory Visit (INDEPENDENT_AMBULATORY_CARE_PROVIDER_SITE_OTHER): Payer: Medicare Other | Admitting: Cardiovascular Disease

## 2015-02-03 VITALS — BP 128/72 | HR 81 | Resp 16 | Ht 61.0 in | Wt 141.5 lb

## 2015-02-03 DIAGNOSIS — I2510 Atherosclerotic heart disease of native coronary artery without angina pectoris: Secondary | ICD-10-CM

## 2015-02-03 MED ORDER — CEPHALEXIN 500 MG PO CAPS: ORAL_CAPSULE | ORAL | Status: DC

## 2015-02-03 MED ORDER — NITROGLYCERIN 0.4 MG/SPRAY TL SOLN: 1.0000 | Status: DC | PRN

## 2015-02-03 NOTE — Patient Instructions (Signed)
Your physician wants you to follow-up in: [place] will receive a reminder letter in the mail two months in advance. If you don't receive a letter, please call our office to schedule the follow-up appointment.  START KELFEX 500 MG ONE TABLET TWICE DAILY X 10 DAYS  ELEVATED LEGS ALL THE TIME  MEASURE THE DIAMETER OF THE VENOUS STATUS ULCER ONCE WEEKLY= CALL IF DIAMETER REACHES 1/2 INCH OR IF YOU DEVELOP FEVER OR CHILLS  Your physician recommends that you return for lab work PRIOR TO APPOINTMENT IN 6 MONTHS

## 2015-02-03 NOTE — Progress Notes (Signed)
Patient ID: [name], female   DOB: [birth year], 81 y.o.   MRN: [id number]     Cardiology Office Note   Date:  02/03/2015   ID:  [name], DOB February 21, 1928, MRN [id number]  PCP:  Lucretia Kern., DO  Cardiologist:    Sanda Klein, MD   Chief Complaint  Patient presents with  . ROV 6 months    patient reports swelling in ankles, and feverish, open sore on ankle  . Medication Management    patient has cut all prescription medications in half because "she couldn't see or walk."      History of Present Illness: [name] is a 81 y.o. female who presents for  Follow-up of coronary artery disease and severe hyperlipidemia , history of type B aortic dissection.   She has no complaints of chest pain either at rest or with exertion and denies dyspnea. As always her life centers around taking care of her husband has Alzheimer's disease. She injured her ankle while trying to take care of him. She now has a nonhealing ulcer immediately inferior to the medial malleolus of her right ankle. Has been there for a few weeks. There is some surrounding redness and earlier reportedly she had lymphangitic streaking up the leg. She has not had fever or shaking chills.   She reduced the dose of all of her prescription medications in half since " she couldn't walk or see" ( checking a little more detail she seems to be describing weakness ). She does not take any antihypertensives medication. She has a long-standing history of intolerance to numerous statins. We had obtained approval for a PCS K9 inhibitor but she decided in the end that she wouldn't take it since she feels that is a new and experimental treatment. "She does not want to be a [place] pig".   there has been minimum improvement in her lipid profile. The total cholesterol has decreased from 276 to 250. The LDL cholesterol has decreased from 187 to 167.  She initially presented with an acute myocardial infarction in May of 2009. Cardiac  catheterization showed culprit lesions to be in the right coronary artery and she received 2 drug-eluting stents to this vessel (mid RCA and ostial PDA). She also had high-grade stenosis in the proximal LAD and was brought in for a stage procedure later on the same admission. 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 The procedure was aborted and she subsequently underwent bypass surgery. She has done well since that time without any new coronary events. She does not smoke, does not have hypertension and does not have diabetes mellitus but has severe mixed hyperlipidemia and is intolerant to statins. Previous treatment with Vytorin, Lipitor daily Crestor and even once weekly Crestor was not tolerated due to weakness.   Past Medical History  Diagnosis Date  . CAD (coronary artery disease)   . 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  . History of acute anterior wall MI 11/19/2007  . H/O aortic dissection     Type B  . Dyslipidemia   . Carotid atherosclerosis     Past Surgical History  Procedure Laterality Date  . Coronary artery bypass graft  11/30/2007    LIMA to LAD,SVG to 1st and 2nd diagonal arteries  . Coronary angioplasty with stent  placement  11/21/2007    stenting of the RCA followed by staged LAD intervention  . Nm myoview ltd  07/13/2010    normal     Current Outpatient Prescriptions  Medication Sig Dispense Refill  . aspirin EC 81 MG tablet Take 81 mg by mouth daily.    . cephALEXin (KEFLEX) 500 MG capsule ONE TABLET TWICE DAILY X 10 DAYS 20 capsule 0  . Chromium-Cinnamon 50-500 MCG-MG CAPS Take 1 capsule by mouth daily.    . clopidogrel (PLAVIX) 75 MG tablet Take 1 tablet (75 mg total) by mouth once. 90 tablet 3  . ezetimibe (ZETIA) 10 MG tablet Take 1 tablet (10 mg total) by mouth daily. 90 tablet 3  . fish  oil-omega-3 fatty acids 1000 MG capsule Take 2 g by mouth daily.    . Multiple Vitamin (MULTIVITAMIN) tablet Take 1 tablet by mouth daily.    . nitroGLYCERIN (NITROLINGUAL) 0.4 MG/SPRAY spray Place 1 spray under the tongue every 5 (five) minutes x 3 doses as needed for chest pain. If no relief after that then call 911. 25 g 6  . pravastatin (PRAVACHOL) 40 MG tablet Take 1 tablet (40 mg total) by mouth every evening. 90 tablet 3   No current facility-administered medications for this visit.    Allergies:   Lipitor and Simvastatin    Social History:  The patient  reports that she has never smoked. She has never used smokeless tobacco. She reports that she does not drink alcohol or use illicit drugs.   Family History:  The patient's family history includes Diabetes in her mother; Heart failure in her father and mother; Stroke in her mother.    ROS:  Please see the history of present illness.    Otherwise, review of systems positive for none.   All other systems are reviewed and negative.    PHYSICAL EXAM: VS:  BP 128/72 mmHg  Pulse 81  Resp 16  Ht 5\' 1"  (1.549 m)  Wt 141 lb 8 oz (64.184 kg)  BMI 26.75 kg/m2 , BMI Body mass index is 26.75 kg/(m^2).  General: Alert, oriented x3, no distress Head: no evidence of trauma, PERRL, EOMI, no exophtalmos or lid lag, no myxedema, no xanthelasma; normal ears, nose and oropharynx Neck: normal jugular venous pulsations and no hepatojugular reflux; brisk carotid pulses without delay and no carotid bruits Chest: clear to auscultation, no signs of consolidation by percussion or palpation, normal fremitus, symmetrical and full respiratory excursions Cardiovascular: normal position and quality of the apical impulse, regular rhythm, normal first and second heart sounds, no murmurs, rubs or gallops Abdomen: no tenderness or distention, no masses by palpation, no abnormal pulsatility or arterial bruits, normal bowel sounds, no  hepatosplenomegaly Extremities: no clubbing, cyanosis or edema; 3/8" round smooth walled reddish ulcer  Inferior to the right medial malleolus with a surrounding area of erythema of roughly 2 inches , slight warmth, no lymphangitis;2+ radial, ulnar and brachial pulses bilaterally; 2+ right femoral, posterior tibial and dorsalis pedis pulses; 2+ left femoral, posterior tibial and dorsalis pedis pulses; no subclavian or femoral bruits Neurological: grossly nonfocal Psych: euthymic mood, full affect   EKG:  EKG is ordered today. It shows NSR.   Recent Labs: 08/15/2014: ALT 35; BUN 13; Creat 0.55; Potassium 4.4; Sodium 142    Lipid Panel    Component Value Date/Time   CHOL 250* 01/30/2015 1010   TRIG 129 01/30/2015 1010   HDL 57 01/30/2015 1010   CHOLHDL 4.4 01/30/2015 1010  VLDL 26 01/30/2015 1010   LDLCALC 167* 01/30/2015 1010      Wt Readings from Last 3 Encounters:  02/03/15 141 lb 8 oz (64.184 kg)  08/19/14 143 lb 9 oz (65.118 kg)  02/14/14 142 lb 8 oz (64.638 kg)     ASSESSMENT AND PLAN:  1.  Venous stasis ulcer -  This is not the first time she's had this problem. There seems to be some surrounding cellulitis. Have given her prescription for anti-biotics. Strongly encouraged her to go to the wound center but she declines. Asked her to measure the ulcer at least once a week and to call if it increases in size. Also asked her to immediately seek attention if she develops fever or chills. Ultimately this will not heal without rest and leg elevation. She is on her feet all the time taking care of her husband. She does not want to go to the wound center or any other type of treatment that would take her away from her husband.  2. CAD s/p CABG - currently asymptomatic  3.  Severe hyperlipidemia with marginal response to treatment with pravastatin and Zetia. This is partly due to the fact that she has reduced the dose of both medications in half and I suspect is also not compliant  with daily therapy. 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 Strongly encouraged her to reconsider treatment with Praluent.  Current medicines are reviewed at length with the patient today.  The patient does not have concerns regarding medicines.   Labs/ tests ordered today include:  Orders Placed This Encounter  Procedures  . EKG 12-Lead   Patient Instructions  Your physician wants you to follow-up in: [place] will receive a reminder letter in the mail two months in advance. If you don't receive a letter, please call our office to schedule the follow-up appointment.  START KELFEX 500 MG ONE TABLET TWICE DAILY X 10 DAYS  ELEVATED LEGS ALL THE TIME  MEASURE THE DIAMETER OF THE VENOUS STATUS ULCER ONCE WEEKLY= CALL IF DIAMETER REACHES 1/2 INCH OR IF YOU DEVELOP FEVER OR CHILLS  Your physician recommends that you return for lab work PRIOR TO APPOINTMENT IN 6 MONTHS    Signed, Pola Furno, MD  02/03/2015 5:22 PM    Sanda Klein, MD, [facility] [phone] office [phone] pager

## 2015-05-06 DIAGNOSIS — J0140 Acute pansinusitis, unspecified: Secondary | ICD-10-CM | POA: Diagnosis not present

## 2015-07-24 DIAGNOSIS — H52223 Regular astigmatism, bilateral: Secondary | ICD-10-CM | POA: Diagnosis not present

## 2015-07-24 DIAGNOSIS — H25813 Combined forms of age-related cataract, bilateral: Secondary | ICD-10-CM | POA: Diagnosis not present

## 2015-09-18 ENCOUNTER — Other Ambulatory Visit: Payer: Self-pay | Admitting: Cardiovascular Disease

## 2015-09-18 NOTE — Telephone Encounter (Signed)
REFILL 

## 2015-09-23 DIAGNOSIS — H18412 Arcus senilis, left eye: Secondary | ICD-10-CM | POA: Diagnosis not present

## 2015-09-23 DIAGNOSIS — H2512 Age-related nuclear cataract, left eye: Secondary | ICD-10-CM | POA: Diagnosis not present

## 2015-09-23 DIAGNOSIS — H2511 Age-related nuclear cataract, right eye: Secondary | ICD-10-CM | POA: Diagnosis not present

## 2015-09-23 DIAGNOSIS — H18411 Arcus senilis, right eye: Secondary | ICD-10-CM | POA: Diagnosis not present

## 2015-10-01 ENCOUNTER — Ambulatory Visit (INDEPENDENT_AMBULATORY_CARE_PROVIDER_SITE_OTHER): Payer: Medicare Other | Admitting: Cardiovascular Disease

## 2015-10-01 ENCOUNTER — Encounter: Payer: Self-pay | Admitting: Cardiovascular Disease

## 2015-10-01 VITALS — BP 126/70 | HR 83 | Ht 61.0 in | Wt 144.2 lb

## 2015-10-01 DIAGNOSIS — I2510 Atherosclerotic heart disease of native coronary artery without angina pectoris: Secondary | ICD-10-CM

## 2015-10-01 DIAGNOSIS — Z79899 Other long term (current) drug therapy: Secondary | ICD-10-CM | POA: Diagnosis not present

## 2015-10-01 DIAGNOSIS — Z789 Other specified health status: Secondary | ICD-10-CM

## 2015-10-01 DIAGNOSIS — Z889 Allergy status to unspecified drugs, medicaments and biological substances status: Secondary | ICD-10-CM

## 2015-10-01 DIAGNOSIS — E785 Hyperlipidemia, unspecified: Secondary | ICD-10-CM

## 2015-10-01 DIAGNOSIS — I71012 Dissection of descending thoracic aorta: Secondary | ICD-10-CM

## 2015-10-01 DIAGNOSIS — I7101 Dissection of thoracic aorta: Secondary | ICD-10-CM | POA: Diagnosis not present

## 2015-10-01 MED ORDER — NITROGLYCERIN 0.4 MG/SPRAY TL SOLN: 1.0000 | Status: DC | PRN

## 2015-10-01 NOTE — Progress Notes (Signed)
Patient ID: [name], female   DOB: 1927-09-05, 82 y.o.   MRN: [id number]    Cardiology Office Note    Date:  10/02/2015   ID:  [name], DOB 12/09/1927, MRN [id number]  PCP:  Lucretia Kern., DO  Cardiologist:   Sanda Klein, MD   Chief Complaint  Patient presents with  . Follow-up    6 mo rov    History of Present Illness:  [name] is a 82 y.o. female with a remote history of type B aortic dissection and CAD, returning for follow-up.  She is accompanied by her daughter. As always, [name] minimizes any possible problems that she may have. Her daughter encourages her to report her symptoms. [name] has been taking care of her husband (who had dementia) for years, he recently passed away. She is living alone and continues taking care of her house and garden independently. She denies any angina or dyspnea on exertion, palpitations, syncope, leg edema, claudication, focal neurological complaints, abdominal pain, bleeding.  She has severe hyperlipidemia with total cholesterol in the high 200s and LDL cholesterol in the 180s. Recently, we started her on pravastatin (she has been intolerant to every other statins tried). She had to cut the dose down from 40 mg to 20 mg at bedtime. She seems to be tolerating this low dose of pravastatin. She declined use of PCS K9 inhibitors.  She initially presented with an acute myocardial infarction in May of 2009. Cardiac catheterization showed culprit lesions to be in the right coronary artery and she received 2 drug-eluting stents to this vessel (mid RCA and ostial PDA). She also had high-grade stenosis in the proximal LAD and was brought in for a stage procedure later on the same admission. 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 The procedure was aborted and she subsequently underwent bypass surgery. She has  done well since that time without any new coronary events. She does not smoke, does not have hypertension and does not have diabetes mellitus but has severe mixed hyperlipidemia and is intolerant to statins. Previous treatment with Vytorin, Lipitor daily Crestor and even once weekly Crestor was not tolerated due to weakness  Past Medical History  Diagnosis Date  . CAD (coronary artery disease)   . 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  . History of acute anterior wall MI 11/19/2007  . H/O aortic dissection     Type B  . Dyslipidemia   . Carotid atherosclerosis     Past Surgical History  Procedure Laterality Date  . Coronary artery bypass graft  11/30/2007    LIMA to LAD,SVG to 1st and 2nd diagonal arteries  . Coronary angioplasty with stent placement  11/21/2007    stenting of the RCA followed by staged LAD intervention  . Nm myoview ltd  07/13/2010    normal    Current Medications: Outpatient Prescriptions Prior to Visit  Medication Sig Dispense Refill  . aspirin EC 81 MG tablet Take 81 mg by mouth daily.    . Chromium-Cinnamon 50-500 MCG-MG CAPS Take 1 capsule by mouth daily.    . clopidogrel (PLAVIX) 75 MG tablet Take 1 tablet (75 mg total) by mouth daily. NEED OV. 90 tablet 0  . fish oil-omega-3 fatty acids 1000 MG capsule Take 2 g by mouth daily.    Marland Kitchen  Multiple Vitamin (MULTIVITAMIN) tablet Take 1 tablet by mouth daily.    . pravastatin (PRAVACHOL) 40 MG tablet Take 1 tablet (40 mg total) by mouth every evening. (Patient taking differently: Take 20 mg by mouth every evening. ) 90 tablet 3  . cephALEXin (KEFLEX) 500 MG capsule ONE TABLET TWICE DAILY X 10 DAYS 20 capsule 0  . ezetimibe (ZETIA) 10 MG tablet Take 1 tablet (10 mg total) by mouth daily. 90 tablet 3  . nitroGLYCERIN (NITROLINGUAL) 0.4 MG/SPRAY spray Place 1 spray under the tongue every 5 (five) minutes x 3 doses as needed for chest pain. If no relief after that then call 911. 25 g 6    No facility-administered medications prior to visit.     Allergies:   Lipitor and Simvastatin   Social History   Social History  . Marital Status: Unknown    Spouse Name: N/A  . Number of Children: N/A  . Years of Education: N/A   Social History Main Topics  . Smoking status: Never Smoker   . Smokeless tobacco: Never Used  . Alcohol Use: No  . Drug Use: No  . Sexual Activity: Not Asked   Other Topics Concern  . None   Social History Narrative     Family History:  The patient's family history includes Diabetes in her mother; Heart failure in her father and mother; Stroke in her mother.   ROS:   Please see the history of present illness.    ROS All other systems reviewed and are negative.   PHYSICAL EXAM:   VS:  BP 126/70 mmHg  Pulse 83  Ht 5\' 1"  (1.549 m)  Wt 65.409 kg (144 lb 3.2 oz)  BMI 27.26 kg/m2   GEN: Well nourished, well developed, in no acute distress HEENT: normal Neck: no JVD, carotid bruits, or masses Cardiac: RRR; no murmurs, rubs, or gallops,no edema  Respiratory:  clear to auscultation bilaterally, normal work of breathing GI: soft, nontender, nondistended, + BS MS: no deformity or atrophy Skin: warm and dry, no rash Neuro:  Alert and Oriented x 3, Strength and sensation are intact Psych: euthymic mood, full affect  Wt Readings from Last 3 Encounters:  10/01/15 65.409 kg (144 lb 3.2 oz)  02/03/15 64.184 kg (141 lb 8 oz)  08/19/14 65.118 kg (143 lb 9 oz)      Studies/Labs Reviewed:   EKG:  EKG is ordered today.  The ekg ordered today demonstrates Normal sinus rhythm, minor nonspecific T-wave abnormality in  Recent Labs: No results found for requested labs within last 365 days.   Lipid Panel    Component Value Date/Time   CHOL 250* 01/30/2015 1010   TRIG 129 01/30/2015 1010   HDL 57 01/30/2015 1010   CHOLHDL 4.4 01/30/2015 1010   VLDL 26 01/30/2015 1010   LDLCALC 167* 01/30/2015 1010     ASSESSMENT:    1. Coronary artery  disease involving native coronary artery of native heart without angina pectoris   2. Dyslipidemia   3. Statin intolerance   4. Dissecting aneurysm of thoracic aorta, Stanford type B (HCC)      PLAN:  In order of problems listed above:  1. CAD: She reports that she is completely asymptomatic despite being very physically active. No plan for revascularization at this time. 2. HLP: I doubt that the pravastatin 20 mg daily Will get her anywhere close to target LDL cholesterol levels, but it is probably better than nothing. She does not want to reconsider PCS  K9 inhibitors. Recheck  Lipids. 3. Recommend that she take coenzyme Q10 300 mg daily 4. Type B Ao dissection: Postprocedural, asymptomatic.    Medication Adjustments/Labs and Tests Ordered: Current medicines are reviewed at length with the patient today.  Concerns regarding medicines are outlined above.  Medication changes, Labs and Tests ordered today are listed in the Patient Instructions below. Patient Instructions  Your physician recommends that you continue on your current medications as directed. Please refer to the Current Medication list given to you today.  Your physician recommends that you return for lab work at your earliest [place].  Dr Sallyanne Kuster recommends that you schedule a follow-up appointment in 6 months. You will receive a reminder letter in the mail two months in advance. If you don't receive a letter, please call our office to schedule the follow-up appointment.  If you need a refill on your cardiac medications before your next appointment, please call your pharmacy.    Mikael Spray, MD  10/02/2015 3:34 PM    [facility] [address], [place], [place]  [address] Phone: [phone]; Fax: [phone]

## 2015-10-01 NOTE — Patient Instructions (Signed)
Your physician recommends that you continue on your current medications as directed. Please refer to the Current Medication list given to you today.  Your physician recommends that you return for lab work at your earliest [place].  Dr Sallyanne Kuster recommends that you schedule a follow-up appointment in 6 months. You will receive a reminder letter in the mail two months in advance. If you don't receive a letter, please call our office to schedule the follow-up appointment.  If you need a refill on your cardiac medications before your next appointment, please call your pharmacy.

## 2015-10-17 ENCOUNTER — Telehealth: Payer: Self-pay

## 2015-10-17 NOTE — Telephone Encounter (Signed)
Request for surgical clearance:   1. What type surgery is being performed? Cataract extraction w/ intraocular lens implantation of the RIGHT eye followed by the LEFT eye  2. When is this surgery scheduled? Right eye-May 15, Left eye-June 5  3. Are there any medications that need to be held prior to surgery and how long? "No need to stop any meds. Topical anesthesia only.  4. Name of the physician performing surgery: Dr Vevelyn Royals  5. What is the office phone and fax number? [place] [facility] [employer]  Phone [phone]  Fax [phone]

## 2015-10-18 ENCOUNTER — Encounter: Payer: Self-pay | Admitting: Cardiovascular Disease

## 2015-10-18 NOTE — Telephone Encounter (Signed)
Letter via epic 

## 2015-10-21 NOTE — Telephone Encounter (Signed)
Letter faxed to Dr Duayne Cal office.

## 2015-10-22 ENCOUNTER — Other Ambulatory Visit: Payer: Self-pay | Admitting: Cardiovascular Disease

## 2015-11-10 DIAGNOSIS — H2511 Age-related nuclear cataract, right eye: Secondary | ICD-10-CM | POA: Diagnosis not present

## 2015-11-10 DIAGNOSIS — H26491 Other secondary cataract, right eye: Secondary | ICD-10-CM | POA: Diagnosis not present

## 2015-11-10 DIAGNOSIS — H25811 Combined forms of age-related cataract, right eye: Secondary | ICD-10-CM | POA: Diagnosis not present

## 2015-11-11 DIAGNOSIS — H2512 Age-related nuclear cataract, left eye: Secondary | ICD-10-CM | POA: Diagnosis not present

## 2015-12-01 DIAGNOSIS — H25012 Cortical age-related cataract, left eye: Secondary | ICD-10-CM | POA: Diagnosis not present

## 2015-12-01 DIAGNOSIS — H2512 Age-related nuclear cataract, left eye: Secondary | ICD-10-CM | POA: Diagnosis not present

## 2015-12-01 DIAGNOSIS — H25812 Combined forms of age-related cataract, left eye: Secondary | ICD-10-CM | POA: Diagnosis not present

## 2015-12-31 ENCOUNTER — Other Ambulatory Visit: Payer: Self-pay | Admitting: Cardiovascular Disease

## 2016-03-24 DIAGNOSIS — E785 Hyperlipidemia, unspecified: Secondary | ICD-10-CM | POA: Diagnosis not present

## 2016-03-24 DIAGNOSIS — Z79899 Other long term (current) drug therapy: Secondary | ICD-10-CM | POA: Diagnosis not present

## 2016-03-25 LAB — COMPREHENSIVE METABOLIC PANEL
ALBUMIN: 4.2 g/dL (ref 3.6–5.1)
ALK PHOS: 67 U/L (ref 33–130)
ALT: 23 U/L (ref 6–29)
AST: 26 U/L (ref 10–35)
BUN: 14 mg/dL (ref 7–25)
CALCIUM: 9.3 mg/dL (ref 8.6–10.4)
CHLORIDE: 108 mmol/L (ref 98–110)
CO2: 26 mmol/L (ref 20–31)
Creat: 0.68 mg/dL (ref 0.60–0.88)
Glucose, Bld: 111 mg/dL — ABNORMAL HIGH (ref 65–99)
POTASSIUM: 4.7 mmol/L (ref 3.5–5.3)
Sodium: 142 mmol/L (ref 135–146)
TOTAL PROTEIN: 6.6 g/dL (ref 6.1–8.1)
Total Bilirubin: 1 mg/dL (ref 0.2–1.2)

## 2016-03-25 LAB — LIPID PANEL
CHOL/HDL RATIO: 4.1 ratio (ref <=5.0)
Cholesterol: 255 mg/dL — ABNORMAL HIGH (ref 125–200)
HDL: 62 mg/dL (ref >=46)
LDL Cholesterol: 167 mg/dL — ABNORMAL HIGH (ref <130)
TRIGLYCERIDES: 128 mg/dL (ref <150)
VLDL: 26 mg/dL (ref <30)

## 2016-03-29 ENCOUNTER — Ambulatory Visit (INDEPENDENT_AMBULATORY_CARE_PROVIDER_SITE_OTHER): Payer: Medicare Other | Admitting: Cardiovascular Disease

## 2016-03-29 ENCOUNTER — Encounter: Payer: Self-pay | Admitting: Cardiovascular Disease

## 2016-03-29 VITALS — BP 134/74 | HR 77 | Ht 61.0 in | Wt 140.8 lb

## 2016-03-29 DIAGNOSIS — E7800 Pure hypercholesterolemia, unspecified: Secondary | ICD-10-CM

## 2016-03-29 DIAGNOSIS — I2510 Atherosclerotic heart disease of native coronary artery without angina pectoris: Secondary | ICD-10-CM

## 2016-03-29 DIAGNOSIS — I7101 Dissection of thoracic aorta: Secondary | ICD-10-CM | POA: Diagnosis not present

## 2016-03-29 DIAGNOSIS — Z789 Other specified health status: Secondary | ICD-10-CM

## 2016-03-29 DIAGNOSIS — I71012 Dissection of descending thoracic aorta: Secondary | ICD-10-CM

## 2016-03-29 NOTE — Patient Instructions (Signed)
Dr Croitoru recommends that you schedule a follow-up appointment in 1 year. You will receive a reminder letter in the mail two months in advance. If you don't receive a letter, please call our office to schedule the follow-up appointment.  If you need a refill on your cardiac medications before your next appointment, please call your pharmacy. 

## 2016-03-29 NOTE — Progress Notes (Signed)
Patient ID: CLARIA GIARRUSSO, female   DOB: 1928-06-06, 82 y.o.   MRN: [id number]    Cardiology Office Note    Date:  03/29/2016   ID:  [name], DOB 08/06/1927, MRN [id number]  PCP:  Lynn Kern., DO  Cardiologist:   Lynn Klein, MD   Chief Complaint  Patient presents with  . Follow-up    Pt states no Sx.     History of Present Illness:  [name] is a 82 y.o. female with a remote history of type B aortic dissection and CAD, returning for follow-up.  She is accompanied by her daughter, as always.[name] is living alone and continues taking care of her house and garden independently. She denies any angina or dyspnea on exertion, palpitations, syncope, leg edema, claudication, focal neurological complaints, abdominal pain, bleeding.  She has severe hyperlipidemia with total cholesterol in the high 200s and LDL cholesterol in the 180s. Recently, we started her on pravastatin (she has been intolerant to every other statins tried). She had to cut the dose down from 40 mg to 20 mg at bedtime. She seems to be tolerating this low dose of pravastatin. She declined use of PCS K9 inhibitors in the past due to cost, but was willing to reconsider if the cost can be manageable.  She initially presented with an acute myocardial infarction in May of 2009. Cardiac catheterization showed culprit lesions to be in the right coronary artery and she received 2 drug-eluting stents to this vessel (mid RCA and ostial PDA). She also had high-grade stenosis in the proximal LAD and was brought in for a stage procedure later on the same admission. 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 The procedure was aborted and she subsequently underwent bypass surgery. She has done well since that time without any new coronary events. She does not smoke, does not have hypertension  and does not have diabetes mellitus but has severe mixed hyperlipidemia and is intolerant to statins. Previous treatment with Vytorin, Lipitor daily Crestor and even once weekly Crestor was not tolerated due to weakness  Past Medical History:  Diagnosis Date  . CAD (coronary artery disease)   . Carotid atherosclerosis   . Dyslipidemia   . H/O aortic dissection    Type B  . History of acute anterior wall MI 11/19/2007  . 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    Past Surgical History:  Procedure Laterality Date  . CORONARY ANGIOPLASTY WITH STENT PLACEMENT  11/21/2007   stenting of the RCA followed by staged LAD intervention  . CORONARY ARTERY BYPASS GRAFT  11/30/2007   LIMA to LAD,SVG to 1st and 2nd diagonal arteries  . NM MYOVIEW LTD  07/13/2010   normal    Current Medications: Outpatient Medications Prior to Visit  Medication Sig Dispense Refill  . aspirin EC 81 MG tablet Take 81 mg by mouth daily.    . Chromium-Cinnamon 50-500 MCG-MG CAPS Take 1 capsule by mouth daily.    . clopidogrel (PLAVIX) 75 MG tablet TAKE 1 TABLET BY MOUTH DAILY 90 tablet 1  . fish oil-omega-3 fatty acids 1000 MG capsule Take 2 g by mouth daily.    . Multiple Vitamin (MULTIVITAMIN) tablet Take 1 tablet by mouth daily.    . nitroGLYCERIN (NITROLINGUAL) 0.4 MG/SPRAY spray Place  1 spray under the tongue every 5 (five) minutes x 3 doses as needed for chest pain. If no relief after that then call 911. 25 g 6  . pravastatin (PRAVACHOL) 40 MG tablet TAKE 1 TABLET BY MOUTH EVERY EVENING 90 tablet 1   No facility-administered medications prior to visit.      Allergies:   Lipitor [atorvastatin] and Simvastatin   Social History   Social History  . Marital status: Unknown    Spouse name: N/A  . Number of children: N/A  . Years of education: N/A   Social History Main Topics  . Smoking status: Never Smoker  . Smokeless tobacco: Never Used  . Alcohol use No  . Drug use: No  .  Sexual activity: Not Asked   Other Topics Concern  . None   Social History Narrative  . None     Family History:  The patient's family history includes Diabetes in her mother; Heart failure in her father and mother; Stroke in her mother.   ROS:   Please see the history of present illness.    ROS All other systems reviewed and are negative.   PHYSICAL EXAM:   VS:  BP 134/74   Pulse 77   Ht 5\' 1"  (1.549 m)   Wt 63.9 kg (140 lb 12.8 oz)   BMI 26.60 kg/m    GEN: Well nourished, well developed, in no acute distress  HEENT: normal  Neck: no JVD, carotid bruits, or masses Cardiac: RRR; no murmurs, rubs, or gallops,no edema  Respiratory:  clear to auscultation bilaterally, normal work of breathing GI: soft, nontender, nondistended, + BS MS: no deformity or atrophy  Skin: warm and dry, no rash Neuro:  Alert and Oriented x 3, Strength and sensation are intact Psych: euthymic mood, full affect  Wt Readings from Last 3 Encounters:  03/29/16 63.9 kg (140 lb 12.8 oz)  10/01/15 65.4 kg (144 lb 3.2 oz)  02/03/15 64.2 kg (141 lb 8 oz)      Studies/Labs Reviewed:   EKG:  EKG is ordered today.  The ekg ordered today demonstrates Normal sinus rhythm, minor nonspecific T-wave abnormality in  Recent Labs: 03/24/2016: ALT 23; BUN 14; Creat 0.68; Potassium 4.7; Sodium 142   Lipid Panel    Component Value Date/Time   CHOL 255 (H) 03/24/2016 1025   TRIG 128 03/24/2016 1025   HDL 62 03/24/2016 1025   CHOLHDL 4.1 03/24/2016 1025   VLDL 26 03/24/2016 1025   LDLCALC 167 (H) 03/24/2016 1025     ASSESSMENT:    1. Coronary artery disease involving native coronary artery of native heart without angina pectoris   2. Pure hypercholesterolemia   3. Statin intolerance   4. Dissecting aneurysm of thoracic aorta, Stanford type B (HCC)      PLAN:  In order of problems listed above:  1. CAD: She reports that she is completely asymptomatic despite being very physically active. No plan for  revascularization at this time. 2. HLP: We'll continue the pravastatin 20 mg daily, but as expected we are nowhere near to target LDL cholesterol levels. She is willing to reconsider PCS K9 inhibitors, but will probably require a patient assistance program. We'll ask our PharmD to start the process. 3. Recommend that she take coenzyme Q10 300 mg daily 4. Type B Ao dissection: remote postprocedural event, asymptomatic.    Medication Adjustments/Labs and Tests Ordered: Current medicines are reviewed at length with the patient today.  Concerns regarding medicines are outlined above.  Medication changes, Labs and Tests ordered today are listed in the Patient Instructions below. Patient Instructions  Dr Sallyanne Kuster recommends that you schedule a follow-up appointment in 1 year. You will receive a reminder letter in the mail two months in advance. If you don't receive a letter, please call our office to schedule the follow-up appointment.  If you need a refill on your cardiac medications before your next appointment, please call your pharmacy.    Signed, Lynn Klein, MD  03/29/2016 7:56 PM    [facility] [address], [place], [place]  [address] Phone: [phone]; Fax: [phone]

## 2016-06-03 ENCOUNTER — Telehealth: Payer: Self-pay | Admitting: Pharmacist Clinician (PhC)/ Clinical Pharmacy Specialist

## 2016-06-03 NOTE — Telephone Encounter (Signed)
Patient was referred to lipid clinic to consider PCSK-9 therapy.  She politely declined at this time

## 2016-07-17 ENCOUNTER — Other Ambulatory Visit: Payer: Self-pay | Admitting: Cardiovascular Disease

## 2016-10-19 ENCOUNTER — Other Ambulatory Visit: Payer: Self-pay | Admitting: Cardiovascular Disease

## 2016-10-19 NOTE — Telephone Encounter (Signed)
Rx request sent to pharmacy.  

## 2016-11-04 ENCOUNTER — Other Ambulatory Visit: Payer: Self-pay | Admitting: Cardiovascular Disease

## 2016-11-04 NOTE — Telephone Encounter (Signed)
REFILL 

## 2017-10-31 ENCOUNTER — Other Ambulatory Visit: Payer: Self-pay | Admitting: Cardiovascular Disease

## 2017-11-14 DIAGNOSIS — M25551 Pain in right hip: Secondary | ICD-10-CM | POA: Diagnosis not present

## 2017-11-14 DIAGNOSIS — M545 Low back pain: Secondary | ICD-10-CM | POA: Diagnosis not present

## 2017-12-16 DIAGNOSIS — M1711 Unilateral primary osteoarthritis, right knee: Secondary | ICD-10-CM | POA: Diagnosis not present

## 2017-12-27 DIAGNOSIS — M545 Low back pain: Secondary | ICD-10-CM | POA: Diagnosis not present

## 2017-12-27 DIAGNOSIS — M25551 Pain in right hip: Secondary | ICD-10-CM | POA: Diagnosis not present

## 2017-12-27 DIAGNOSIS — M79661 Pain in right lower leg: Secondary | ICD-10-CM | POA: Diagnosis not present

## 2020-09-12 DIAGNOSIS — C44729 Squamous cell carcinoma of skin of left lower limb, including hip: Secondary | ICD-10-CM | POA: Diagnosis not present

## 2020-09-12 DIAGNOSIS — L570 Actinic keratosis: Secondary | ICD-10-CM | POA: Diagnosis not present

## 2020-09-12 DIAGNOSIS — X32XXXA Exposure to sunlight, initial encounter: Secondary | ICD-10-CM | POA: Diagnosis not present

## 2020-11-04 DIAGNOSIS — Z08 Encounter for follow-up examination after completed treatment for malignant neoplasm: Secondary | ICD-10-CM | POA: Diagnosis not present

## 2020-11-04 DIAGNOSIS — Z85828 Personal history of other malignant neoplasm of skin: Secondary | ICD-10-CM | POA: Diagnosis not present

## 2021-03-10 DIAGNOSIS — U071 COVID-19: Secondary | ICD-10-CM | POA: Diagnosis not present

## 2021-03-13 DIAGNOSIS — R42 Dizziness and giddiness: Secondary | ICD-10-CM | POA: Diagnosis not present

## 2021-03-13 DIAGNOSIS — I959 Hypotension, unspecified: Secondary | ICD-10-CM | POA: Diagnosis not present

## 2021-03-13 DIAGNOSIS — R55 Syncope and collapse: Secondary | ICD-10-CM | POA: Diagnosis not present

## 2021-03-13 DIAGNOSIS — R61 Generalized hyperhidrosis: Secondary | ICD-10-CM | POA: Diagnosis not present

## 2021-07-06 ENCOUNTER — Ambulatory Visit: Payer: Medicare Other | Admitting: Hematology and Oncology

## 2021-09-30 DIAGNOSIS — H5582 Deficient smooth pursuit eye movements: Secondary | ICD-10-CM | POA: Diagnosis not present

## 2021-09-30 DIAGNOSIS — H52223 Regular astigmatism, bilateral: Secondary | ICD-10-CM | POA: Diagnosis not present

## 2022-05-28 DIAGNOSIS — C801 Malignant (primary) neoplasm, unspecified: Secondary | ICD-10-CM

## 2022-05-28 HISTORY — DX: Malignant (primary) neoplasm, unspecified: C80.1

## 2022-06-03 ENCOUNTER — Emergency Department (HOSPITAL_BASED_OUTPATIENT_CLINIC_OR_DEPARTMENT_OTHER): Payer: Medicare Other

## 2022-06-03 ENCOUNTER — Encounter (HOSPITAL_COMMUNITY): Payer: Self-pay

## 2022-06-03 ENCOUNTER — Other Ambulatory Visit: Payer: Self-pay

## 2022-06-03 ENCOUNTER — Inpatient Hospital Stay (HOSPITAL_BASED_OUTPATIENT_CLINIC_OR_DEPARTMENT_OTHER)
Admission: EM | Admit: 2022-06-03 | Discharge: 2022-06-07 | DRG: 186 | Disposition: A | Discharge status: Home Health | Payer: Medicare Other | Attending: Family Medicine | Admitting: Family Medicine

## 2022-06-03 ENCOUNTER — Encounter (HOSPITAL_BASED_OUTPATIENT_CLINIC_OR_DEPARTMENT_OTHER): Payer: Self-pay | Admitting: *Deleted

## 2022-06-03 ENCOUNTER — Emergency Department (HOSPITAL_BASED_OUTPATIENT_CLINIC_OR_DEPARTMENT_OTHER): Payer: Medicare Other | Admitting: Radiology

## 2022-06-03 DIAGNOSIS — M79662 Pain in left lower leg: Secondary | ICD-10-CM | POA: Diagnosis present

## 2022-06-03 DIAGNOSIS — I25119 Atherosclerotic heart disease of native coronary artery with unspecified angina pectoris: Secondary | ICD-10-CM | POA: Diagnosis not present

## 2022-06-03 DIAGNOSIS — J9601 Acute respiratory failure with hypoxia: Secondary | ICD-10-CM | POA: Diagnosis present

## 2022-06-03 DIAGNOSIS — R1310 Dysphagia, unspecified: Secondary | ICD-10-CM | POA: Diagnosis present

## 2022-06-03 DIAGNOSIS — N6310 Unspecified lump in the right breast, unspecified quadrant: Secondary | ICD-10-CM | POA: Diagnosis present

## 2022-06-03 DIAGNOSIS — Z8249 Family history of ischemic heart disease and other diseases of the circulatory system: Secondary | ICD-10-CM

## 2022-06-03 DIAGNOSIS — I2510 Atherosclerotic heart disease of native coronary artery without angina pectoris: Secondary | ICD-10-CM | POA: Diagnosis present

## 2022-06-03 DIAGNOSIS — J189 Pneumonia, unspecified organism: Secondary | ICD-10-CM | POA: Diagnosis not present

## 2022-06-03 DIAGNOSIS — I252 Old myocardial infarction: Secondary | ICD-10-CM

## 2022-06-03 DIAGNOSIS — R590 Localized enlarged lymph nodes: Secondary | ICD-10-CM | POA: Diagnosis present

## 2022-06-03 DIAGNOSIS — N1330 Unspecified hydronephrosis: Secondary | ICD-10-CM | POA: Diagnosis present

## 2022-06-03 DIAGNOSIS — Z955 Presence of coronary angioplasty implant and graft: Secondary | ICD-10-CM | POA: Diagnosis not present

## 2022-06-03 DIAGNOSIS — I6389 Other cerebral infarction: Secondary | ICD-10-CM | POA: Diagnosis present

## 2022-06-03 DIAGNOSIS — R7989 Other specified abnormal findings of blood chemistry: Secondary | ICD-10-CM | POA: Diagnosis present

## 2022-06-03 DIAGNOSIS — M79661 Pain in right lower leg: Secondary | ICD-10-CM | POA: Diagnosis present

## 2022-06-03 DIAGNOSIS — J910 Malignant pleural effusion: Secondary | ICD-10-CM

## 2022-06-03 DIAGNOSIS — E785 Hyperlipidemia, unspecified: Secondary | ICD-10-CM | POA: Diagnosis present

## 2022-06-03 DIAGNOSIS — Z833 Family history of diabetes mellitus: Secondary | ICD-10-CM

## 2022-06-03 DIAGNOSIS — Z951 Presence of aortocoronary bypass graft: Secondary | ICD-10-CM | POA: Diagnosis not present

## 2022-06-03 DIAGNOSIS — J9811 Atelectasis: Secondary | ICD-10-CM | POA: Diagnosis present

## 2022-06-03 DIAGNOSIS — J90 Pleural effusion, not elsewhere classified: Secondary | ICD-10-CM | POA: Diagnosis present

## 2022-06-03 DIAGNOSIS — R279 Unspecified lack of coordination: Secondary | ICD-10-CM | POA: Diagnosis present

## 2022-06-03 DIAGNOSIS — I63313 Cerebral infarction due to thrombosis of bilateral middle cerebral arteries: Secondary | ICD-10-CM

## 2022-06-03 DIAGNOSIS — C50911 Malignant neoplasm of unspecified site of right female breast: Secondary | ICD-10-CM

## 2022-06-03 DIAGNOSIS — G893 Neoplasm related pain (acute) (chronic): Secondary | ICD-10-CM | POA: Diagnosis present

## 2022-06-03 DIAGNOSIS — R29700 NIHSS score 0: Secondary | ICD-10-CM | POA: Diagnosis present

## 2022-06-03 DIAGNOSIS — Z823 Family history of stroke: Secondary | ICD-10-CM | POA: Diagnosis not present

## 2022-06-03 DIAGNOSIS — I6523 Occlusion and stenosis of bilateral carotid arteries: Secondary | ICD-10-CM | POA: Diagnosis not present

## 2022-06-03 DIAGNOSIS — E875 Hyperkalemia: Secondary | ICD-10-CM | POA: Diagnosis present

## 2022-06-03 DIAGNOSIS — Z1152 Encounter for screening for COVID-19: Secondary | ICD-10-CM

## 2022-06-03 DIAGNOSIS — N63 Unspecified lump in unspecified breast: Secondary | ICD-10-CM | POA: Diagnosis not present

## 2022-06-03 LAB — CBC
HCT: 45 % (ref 36.0–46.0)
Hemoglobin: 14.9 g/dL (ref 12.0–15.0)
MCH: 29.6 pg (ref 26.0–34.0)
MCHC: 33.1 g/dL (ref 30.0–36.0)
MCV: 89.5 fL (ref 80.0–100.0)
Platelets: 232 10*3/uL (ref 150–400)
RBC: 5.03 MIL/uL (ref 3.87–5.11)
RDW: 13.6 % (ref 11.5–15.5)
WBC: 7.5 10*3/uL (ref 4.0–10.5)
nRBC: 0 % (ref 0.0–0.2)

## 2022-06-03 LAB — BASIC METABOLIC PANEL
Anion gap: 10 (ref 5–15)
BUN: 18 mg/dL (ref 8–23)
CO2: 23 mmol/L (ref 22–32)
Calcium: 9.6 mg/dL (ref 8.9–10.3)
Chloride: 106 mmol/L (ref 98–111)
Creatinine, Ser: 0.65 mg/dL (ref 0.44–1.00)
GFR, Estimated: >60 mL/min (ref >60)
Glucose, Bld: 106 mg/dL — ABNORMAL HIGH (ref 70–99)
Potassium: 5.2 mmol/L — ABNORMAL HIGH (ref 3.5–5.1)
Sodium: 139 mmol/L (ref 135–145)

## 2022-06-03 LAB — RESP PANEL BY RT-PCR (FLU A&B, COVID) ARPGX2
Influenza A by PCR: NEGATIVE
Influenza B by PCR: NEGATIVE
SARS Coronavirus 2 by RT PCR: NEGATIVE

## 2022-06-03 LAB — BRAIN NATRIURETIC PEPTIDE: B Natriuretic Peptide: 61.2 pg/mL (ref 0.0–100.0)

## 2022-06-03 LAB — TROPONIN I (HIGH SENSITIVITY)
Troponin I (High Sensitivity): 3 ng/L (ref <18)
Troponin I (High Sensitivity): 4 ng/L (ref <18)

## 2022-06-03 LAB — D-DIMER, QUANTITATIVE: D-Dimer, Quant: 2.14 ug/mL-FEU — ABNORMAL HIGH (ref 0.00–0.50)

## 2022-06-03 MED ORDER — ALBUTEROL SULFATE (2.5 MG/3ML) 0.083% IN NEBU: 2.5000 mg | INHALATION_SOLUTION | RESPIRATORY_TRACT | Status: DC | PRN

## 2022-06-03 MED ORDER — ALBUTEROL SULFATE HFA 108 (90 BASE) MCG/ACT IN AERS
2.0000 | INHALATION_SPRAY | RESPIRATORY_TRACT | Status: DC | PRN
  Administered 2022-06-03: 2 via RESPIRATORY_TRACT
  Filled 2022-06-03: qty 6.7

## 2022-06-03 MED ORDER — IOHEXOL 350 MG/ML SOLN
100.0000 mL | Freq: Once | INTRAVENOUS | Status: AC | PRN
  Administered 2022-06-03: 60 mL via INTRAVENOUS

## 2022-06-03 MED ORDER — SODIUM CHLORIDE 0.9 % IV SOLN: INTRAVENOUS | Status: AC

## 2022-06-03 MED ORDER — HEPARIN SODIUM (PORCINE) 5000 UNIT/ML IJ SOLN
5000.0000 [IU] | Freq: Three times a day (TID) | INTRAMUSCULAR | Status: DC
  Administered 2022-06-03 – 2022-06-07 (×11): 5000 [IU] via SUBCUTANEOUS
  Filled 2022-06-03 (×11): qty 1

## 2022-06-03 MED ORDER — SODIUM CHLORIDE 0.9 % IV SOLN
500.0000 mg | Freq: Once | INTRAVENOUS | Status: AC
  Administered 2022-06-03: 500 mg via INTRAVENOUS
  Filled 2022-06-03: qty 5

## 2022-06-03 MED ORDER — SODIUM CHLORIDE 0.9 % IV SOLN
1.0000 g | Freq: Once | INTRAVENOUS | Status: AC
  Administered 2022-06-03: 1 g via INTRAVENOUS
  Filled 2022-06-03: qty 10

## 2022-06-03 NOTE — H&P (Addendum)
History and Physical    [name] KGM:[id number] DOB: 01-21-1928 DOA: 06/03/2022  PCP: Lucretia Kern, DO  Patient coming from: [facility]  I have personally briefly reviewed patient's old medical records in [place]  Chief Complaint: fatigue, sob x 2 -3weeks   HPI: [name] is a 88 y.o. female with medical history significant of  CAD s/p CABG , HLD , aortic dissection  who presents to ED brought in by family due to 2-3 weeks of fatigue sob, cough and congestion that is progressive.  Per family concerned that patient is aspiration. Per patient symptoms worse with exertion. She notes no n/v/d/chest pain , fever / chills, but she does endorse significant fatigue. Per daughter no sick contacts at home.  ED Course:  Afeb, bp 127/86, hr 105, rr 28  sat 97%  EKG: sinus tachycardia  twave changed anterior lead  Labs Ce 3,4 Na 139, K 5.2, cl 106, cr 0.65 Wbc 7.5, hg 14.9 Bnp 61.2 D-dimer 2.14 Respiratory panel neg cxr MPRESSION: Moderate right and small left pleural effusions with associated atelectasis.  U/s LE b/l neg CTPA MPRESSION: 1. No CT findings for pulmonary embolism. 2. Large right pleural effusion with significant overlying atelectasis and infiltrate. Marked soft tissue thickening around the right hilum and right infrahilar region. 3. Small left effusion and overlying atelectasis. 4. Mediastinal, hilar and upper abdominal lymphadenopathy worrisome for metastatic disease. 5. Asymmetric density involving the right breast and possible 5 x 3 cm mass. Recommend correlation with breast exam and mammogram and ultrasound as indicated. Right axillary adenopathy is worrisome. 6. Aortic atherosclerosis.  Tx azithromycin, ctx Review of Systems: As per HPI otherwise 10 point review of systems negative.   Past Medical History:  Diagnosis Date   CAD (coronary artery disease)    Carotid atherosclerosis    Dyslipidemia    H/O aortic dissection    Type B   History of  acute anterior wall MI 11/19/2007   S/P CABG x 3 11/30/2007   LIMA to LAD,SVG to 1st & 2nd diagonal arteries.    Past Surgical History:  Procedure Laterality Date   CORONARY ANGIOPLASTY WITH STENT PLACEMENT  11/21/2007   stenting of the RCA followed by staged LAD intervention   CORONARY ARTERY BYPASS GRAFT  11/30/2007   LIMA to LAD,SVG to 1st and 2nd diagonal arteries   NM MYOVIEW LTD  07/13/2010   normal     reports that she has never smoked. She has never used smokeless tobacco. She reports that she does not drink alcohol and does not use drugs.  Allergies  Allergen Reactions   Lipitor [Atorvastatin]    Simvastatin     Family History  Problem Relation Age of Onset   Diabetes Mother    Heart failure Mother    Stroke Mother    Heart failure Father     Prior to Admission medications   Medication Sig Start Date End Date Taking? Authorizing Provider  clopidogrel (PLAVIX) 75 MG tablet TAKE 1 TABLET BY MOUTH DAILY Patient not taking: Reported on 06/03/2022 10/31/17   Croitoru, Mihai, MD  nitroGLYCERIN (NITROLINGUAL) 0.4 MG/SPRAY spray Place 1 spray under the tongue every 5 (five) minutes x 3 doses as needed for chest pain. If no relief after that then call 911. Patient not taking: Reported on 06/03/2022 10/01/15   Croitoru, Mihai, MD  pravastatin (PRAVACHOL) 40 MG tablet TAKE 1 TABLET BY MOUTH EACH EVENING Patient not taking: Reported on 06/03/2022 10/31/17   Croitoru, [place],  MD    Physical Exam: Vitals:   06/03/22 1730 06/03/22 1930 06/03/22 2100 06/03/22 2204  BP:  124/87 (!) 134/99 (!) 140/85  Pulse:  95 90 93  Resp:  (!) 31 (!) 28 20  Temp: 97.9 F (36.6 C)  98.2 F (36.8 C) 98.6 F (37 C)  TempSrc:   Oral Oral  SpO2:  94% 94% 98%    Constitutional: NAD, calm, comfortable Vitals:   06/03/22 1730 06/03/22 1930 06/03/22 2100 06/03/22 2204  BP:  124/87 (!) 134/99 (!) 140/85  Pulse:  95 90 93  Resp:  (!) 31 (!) 28 20  Temp: 97.9 F (36.6 C)  98.2 F (36.8 C) 98.6 F (37 C)   TempSrc:   Oral Oral  SpO2:  94% 94% 98%   Eyes: PERRL, lids and conjunctivae normal ENMT: Mucous membranes are moist. Posterior pharynx clear of any exudate or lesions.Normal dentition.  Neck: normal, supple, no masses, no thyromegaly Respiratory: clear to auscultation bilaterally, occasional coarse bs, decrease at bases, no wheezing, no crackles. Normal respiratory effort. No accessory muscle use.  Cardiovascular: Regular rate and rhythm, no murmurs / rubs / gallops. No extremity edema. 2+ pedal pulses. No carotid bruits.  Abdomen: no tenderness, no masses palpated. No hepatosplenomegaly. Bowel sounds positive.  Musculoskeletal: no clubbing / cyanosis. No joint deformity upper and lower extremities. Good ROM, no contractures. Normal muscle tone.  Skin: no rashes, lesions, ulcers. No induration Neurologic: CN 2-12 grossly intact. Sensation intact,. Strength 5/5 in all 4.  Psychiatric: Normal judgment and insight. Alert and oriented x 3. Normal mood.    Labs on Admission: I have personally reviewed following labs and imaging studies  CBC: Recent Labs  Lab 06/03/22 1310  WBC 7.5  HGB 14.9  HCT 45.0  MCV 89.5  PLT 220   Basic Metabolic Panel: Recent Labs  Lab 06/03/22 1310  NA 139  K 5.2*  CL 106  CO2 23  GLUCOSE 106*  BUN 18  CREATININE 0.65  CALCIUM 9.6   GFR: CrCl cannot be calculated (Unknown ideal weight.). Liver Function Tests: No results for input(s): "AST", "ALT", "ALKPHOS", "BILITOT", "PROT", "ALBUMIN" in the last 168 hours. No results for input(s): "LIPASE", "AMYLASE" in the last 168 hours. No results for input(s): "AMMONIA" in the last 168 hours. Coagulation Profile: No results for input(s): "INR", "PROTIME" in the last 168 hours. Cardiac Enzymes: No results for input(s): "CKTOTAL", "CKMB", "CKMBINDEX", "TROPONINI" in the last 168 hours. BNP (last 3 results) No results for input(s): "PROBNP" in the last 8760 hours. HbA1C: No results for input(s):  "HGBA1C" in the last 72 hours. CBG: No results for input(s): "GLUCAP" in the last 168 hours. Lipid Profile: No results for input(s): "CHOL", "HDL", "LDLCALC", "TRIG", "CHOLHDL", "LDLDIRECT" in the last 72 hours. Thyroid Function Tests: No results for input(s): "TSH", "T4TOTAL", "FREET4", "T3FREE", "THYROIDAB" in the last 72 hours. Anemia Panel: No results for input(s): "VITAMINB12", "FOLATE", "FERRITIN", "TIBC", "IRON", "RETICCTPCT" in the last 72 hours. Urine analysis:    Component Value Date/Time   COLORURINE YELLOW 11/27/2007 1231   APPEARANCEUR HAZY (A) 11/27/2007 1231   LABSPEC 1.011 11/27/2007 1231   PHURINE 7.0 11/27/2007 1231   GLUCOSEU NEGATIVE 11/27/2007 1231   HGBUR NEGATIVE 11/27/2007 1231   BILIRUBINUR NEGATIVE 11/27/2007 1231   KETONESUR NEGATIVE 11/27/2007 1231   PROTEINUR NEGATIVE 11/27/2007 1231   UROBILINOGEN 4.0 (H) 11/27/2007 1231   NITRITE NEGATIVE 11/27/2007 1231   LEUKOCYTESUR SMALL (A) 11/27/2007 1231    Radiological Exams on Admission:  US Venous Img Lower Bilateral  Result Date: 06/03/2022 CLINICAL DATA:  Calf pain EXAM: Bilateral LOWER EXTREMITY VENOUS DOPPLER ULTRASOUND TECHNIQUE: Gray-scale sonography with compression, as well as color and duplex ultrasound, were performed to evaluate the deep venous system(s) from the level of the common femoral vein through the popliteal and proximal calf veins. COMPARISON:  None Available. FINDINGS: VENOUS Normal compressibility of the common femoral, superficial femoral, and popliteal veins, as well as the visualized calf veins. Visualized portions of profunda femoral vein and great saphenous vein unremarkable. No filling defects to suggest DVT on grayscale or color Doppler imaging. Doppler waveforms show normal direction of venous flow, normal respiratory plasticity and response to augmentation. OTHER None. Limitations: none IMPRESSION: Negative. Electronically Signed   By: Donavan Foil M.D.   On: 06/03/2022 19:51   CT  Angio Chest PE W and/or Wo Contrast  Result Date: 06/03/2022 CLINICAL DATA:  Chest pain and shortness of breath. EXAM: CT ANGIOGRAPHY CHEST WITH CONTRAST TECHNIQUE: Multidetector CT imaging of the chest was performed using the standard protocol during bolus administration of intravenous contrast. Multiplanar CT image reconstructions and MIPs were obtained to evaluate the vascular anatomy. RADIATION DOSE REDUCTION: This exam was performed according to the departmental dose-optimization program which includes automated exposure control, adjustment of the mA and/or kV according to patient size and/or use of iterative reconstruction technique. CONTRAST:  105m OMNIPAQUE IOHEXOL 350 MG/ML SOLN COMPARISON:  Chest x-ray, same date. FINDINGS: Cardiovascular: The heart is normal in size. No pericardial effusion. The ascending thoracic aorta is upper limits of size. No dissection. Scattered atherosclerotic calcifications. 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 The pulmonary arterial tree is well opacified. No filling defects to suggest pulmonary embolism. Mediastinum/Nodes: Mediastinal and hilar lymphadenopathy. Left paratracheal node measures 10 mm on image 26/4. Precarinal node on image 46/4 measures 13 mm. Subcarinal adenopathy measures 18 mm on image 55/4. There is also pre-vascular and AP window adenopathy and bilateral hilar adenopathy. The esophagus is grossly normal. Lungs/Pleura: Large right pleural effusion with significant overlying atelectasis and infiltrate. No discrete pulmonary mass is identified. Marked soft tissue thickening around the right hilum and right infrahilar region. Small left effusion and overlying atelectasis. Upper Abdomen: Enlarged upper abdominal lymph nodes including a 12 mm celiac axis node and retroperitoneal nodes. No hepatic or adrenal gland lesions. Advanced aortic calcifications. 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 Musculoskeletal: Asymmetric density involving the right breast. Could not exclude a mass. There is also enlarged right axillary lymph nodes which are worrisome. The bony thorax is intact. Review of the MIP images confirms the above findings. IMPRESSION: 1. No CT findings for pulmonary embolism. 2. Large right pleural effusion with significant overlying atelectasis and infiltrate. Marked soft tissue thickening around the right hilum and right infrahilar region. 3. Small left effusion and overlying atelectasis. 4. Mediastinal, hilar and upper abdominal lymphadenopathy worrisome for metastatic disease. 5. Asymmetric density involving the right breast and possible 5 x 3 cm mass. Recommend correlation with breast exam and mammogram and ultrasound as indicated. Right axillary adenopathy is worrisome. 6. Aortic atherosclerosis. Aortic Atherosclerosis (ICD10-I70.0). Electronically Signed   By: PMarijo SanesM.D.   On: 06/03/2022 16:23   DG Chest 2 View  Result Date: 06/03/2022 CLINICAL DATA:  Shortness of breath EXAM: CHEST - 2 VIEW COMPARISON:  01/02/2008 FINDINGS: Status post median sternotomy. Normal cardiac and mediastinal contours given AP technique. Moderate right and small left pleural effusions with associated atelectasis. No pneumothorax.  No acute osseous abnormality. IMPRESSION: Moderate right and small left pleural effusions with associated atelectasis. Electronically Signed   By: Merilyn Baba M.D.   On: 06/03/2022 13:45    EKG: Independently reviewed. See above  Assessment/Plan   CAP presumed bacterial  -empiric antibiotics  -f/u on culture data   Pleural effusion on right Right breast mass  Right hilum soft tissue thickening  Right axillary node enlargement  -r/o malignancy  -ir consult for tap  -mamagram ordered   Acute hypoxic respiratory failure -on 5L -due to cap/pleural effusion  -tx underlying cause    Mild hyperkalemia -repeat labs   CAD s/p CABG -no active  issue  -resume home statin, nitro prn    HLD  -continue statin    Aortic dissection -no acute findings   Goals of care : Family willing to discuss with palliative care re code status and end of life care   DVT prophylaxis: heparin Code Status: full Family Communication:   Loran Senters (Daughter) [phone] ([place])   Disposition Plan: patient  expected to be admitted greater than 2 midnights  Consults called: ir  Admission status: tele   Clance Boll MD [facility]   If 7PM-7AM, please contact night-coverage [email or web address] Password [email or web address]  06/03/2022, 11:07 PM

## 2022-06-03 NOTE — ED Provider Notes (Signed)
[facility] DEPT Provider Note   CSN: [id number] Arrival date & time: 06/03/22  1254     History  Chief Complaint  Patient presents with   Shortness of Breath    [name] is a 88 y.o. female history of CAD status post CABG, dyslipidemia, aortic dissection.  Patient presents with her daughter today for evaluation of shortness of breath ongoing x 2-3 weeks.  Patient reports shortness of breath has been gradual and worsens with exertion improves somewhat with rest.  She denies history of similar symptoms in the past.  Patient's daughter at bedside is also concerned that patient often coughs whenever she is drinking liquids and is not sure if that is related.  Finally patient does report bilateral lower leg pain left worse than right, is an aching pain which is intermittent no clear aggravating or alleviating factors.  Patient denies fever, chills, hemoptysis, chest pain, abdominal pain, nausea, vomiting, extremity swelling, history of blood clot or any additional concerns.  HPI     Home Medications Prior to Admission medications   Medication Sig Start Date End Date Taking? Authorizing Provider  clopidogrel (PLAVIX) 75 MG tablet TAKE 1 TABLET BY MOUTH DAILY Patient not taking: Reported on 06/03/2022 10/31/17   Croitoru, Mihai, MD  nitroGLYCERIN (NITROLINGUAL) 0.4 MG/SPRAY spray Place 1 spray under the tongue every 5 (five) minutes x 3 doses as needed for chest pain. If no relief after that then call 911. Patient not taking: Reported on 06/03/2022 10/01/15   Croitoru, Mihai, MD  pravastatin (PRAVACHOL) 40 MG tablet TAKE 1 TABLET BY MOUTH EACH EVENING Patient not taking: Reported on 06/03/2022 10/31/17   Croitoru, Mihai, MD      Allergies    Lipitor [atorvastatin] and Simvastatin    Review of Systems   Review of Systems Ten systems are reviewed and are negative for acute change except as noted in the HPI  Physical Exam Updated Vital Signs BP 124/78   Pulse  94   Temp 97.9 F (36.6 C)   Resp (!) 32   SpO2 96%  Physical Exam Constitutional:      General: She is not in acute distress.    Appearance: Normal appearance. She is well-developed. She is not ill-appearing or diaphoretic.  HENT:     Head: Normocephalic and atraumatic.  Eyes:     General: Vision grossly intact. Gaze aligned appropriately.     Pupils: Pupils are equal, round, and reactive to light.  Neck:     Trachea: Trachea and phonation normal.  Pulmonary:     Effort: Pulmonary effort is normal. No accessory muscle usage or respiratory distress.     Breath sounds: Decreased breath sounds present.  Abdominal:     General: There is no distension.     Palpations: Abdomen is soft.     Tenderness: There is no abdominal tenderness. There is no guarding or rebound.  Musculoskeletal:        General: Normal range of motion.     Cervical back: Normal range of motion.     Right lower leg: No tenderness. No edema.     Left lower leg: Tenderness present. No edema.  Skin:    General: Skin is warm and dry.  Neurological:     Mental Status: She is alert.     GCS: GCS eye subscore is 4. GCS verbal subscore is 5. GCS motor subscore is 6.     Comments: Speech is clear and goal oriented, follows commands Major Cranial  nerves without deficit, no facial droop Moves extremities without ataxia, coordination intact  Psychiatric:        Behavior: Behavior normal.     ED Results / Procedures / Treatments   Labs (all labs ordered are listed, but only abnormal results are displayed) Labs Reviewed  BASIC METABOLIC PANEL - Abnormal; Notable for the following components:      Result Value   Potassium 5.2 (*)    Glucose, Bld 106 (*)    All other components within normal limits  D-DIMER, QUANTITATIVE - Abnormal; Notable for the following components:   D-Dimer, Quant 2.14 (*)    All other components within normal limits  RESP PANEL BY RT-PCR (FLU A&B, COVID) ARPGX2  CULTURE, BLOOD (ROUTINE X  2)  CULTURE, BLOOD (ROUTINE X 2)  CBC  BRAIN NATRIURETIC PEPTIDE  TROPONIN I (HIGH SENSITIVITY)  TROPONIN I (HIGH SENSITIVITY)    EKG None  Radiology CT Angio Chest PE W and/or Wo Contrast  Result Date: 06/03/2022 CLINICAL DATA:  Chest pain and shortness of breath. EXAM: CT ANGIOGRAPHY CHEST WITH CONTRAST TECHNIQUE: Multidetector CT imaging of the chest was performed using the standard protocol during bolus administration of intravenous contrast. Multiplanar CT image reconstructions and MIPs were obtained to evaluate the vascular anatomy. RADIATION DOSE REDUCTION: This exam was performed according to the departmental dose-optimization program which includes automated exposure control, adjustment of the mA and/or kV according to patient size and/or use of iterative reconstruction technique. CONTRAST:  38m OMNIPAQUE IOHEXOL 350 MG/ML SOLN COMPARISON:  Chest x-ray, same date. FINDINGS: Cardiovascular: The heart is normal in size. No pericardial effusion. The ascending thoracic aorta is upper limits of size. No dissection. Scattered atherosclerotic calcifications. 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 The pulmonary arterial tree is well opacified. No filling defects to suggest pulmonary embolism. Mediastinum/Nodes: Mediastinal and hilar lymphadenopathy. Left paratracheal node measures 10 mm on image 26/4. Precarinal node on image 46/4 measures 13 mm. Subcarinal adenopathy measures 18 mm on image 55/4. There is also pre-vascular and AP window adenopathy and bilateral hilar adenopathy. The esophagus is grossly normal. Lungs/Pleura: Large right pleural effusion with significant overlying atelectasis and infiltrate. No discrete pulmonary mass is identified. Marked soft tissue thickening around the right hilum and right infrahilar region. Small left effusion and overlying atelectasis. Upper Abdomen: Enlarged upper abdominal lymph nodes  including a 12 mm celiac axis node and retroperitoneal nodes. No hepatic or adrenal gland lesions. Advanced aortic calcifications. 14 mm calcified splenic artery aneurysm. Musculoskeletal: Asymmetric density involving the right breast. Could not exclude a mass. There is also enlarged right axillary lymph nodes which are worrisome. The bony thorax is intact. Review of the MIP images confirms the above findings. IMPRESSION: 1. No CT findings for pulmonary embolism. 2. Large right pleural effusion with significant overlying atelectasis and infiltrate. Marked soft tissue thickening around the right hilum and right infrahilar region. 3. Small left effusion and overlying atelectasis. 4. Mediastinal, hilar and upper abdominal lymphadenopathy worrisome for metastatic disease. 5. Asymmetric density involving the right breast and possible 5 x 3 cm mass. Recommend correlation with breast exam and mammogram and ultrasound as indicated. Right axillary adenopathy is worrisome. 6. Aortic atherosclerosis. Aortic Atherosclerosis (ICD10-I70.0). Electronically Signed   By: PMarijo SanesM.D.   On: 06/03/2022 16:23   DG Chest 2 View  Result Date: 06/03/2022 CLINICAL DATA:  Shortness of breath EXAM: CHEST - 2 VIEW COMPARISON:  01/02/2008 FINDINGS: Status post median sternotomy. Normal cardiac  and mediastinal contours given AP technique. Moderate right and small left pleural effusions with associated atelectasis. No pneumothorax. No acute osseous abnormality. IMPRESSION: Moderate right and small left pleural effusions with associated atelectasis. Electronically Signed   By: Merilyn Baba M.D.   On: 06/03/2022 13:45    Procedures .Critical Care  Performed by: Deliah Boston, PA-C Authorized by: Deliah Boston, PA-C   Critical care provider statement:    Critical care time (minutes):  35   Critical care was necessary to treat or prevent imminent or life-threatening deterioration of the following conditions:   Respiratory failure (Acute toxic respiratory failure requiring supplemental oxygen)   Critical care was time spent personally by me on the following activities:  Development of treatment plan with patient or surrogate, discussions with consultants, evaluation of patient's response to treatment, examination of patient, ordering and review of laboratory studies, ordering and review of radiographic studies, ordering and performing treatments and interventions, pulse oximetry, re-evaluation of patient's condition, review of old charts, blood draw for specimens and obtaining history from patient or surrogate   Care discussed with: admitting provider       Medications Ordered in ED Medications  albuterol (VENTOLIN HFA) 108 (90 Base) MCG/ACT inhaler 2 puff (2 puffs Inhalation Given 06/03/22 1354)  cefTRIAXone (ROCEPHIN) 1 g in sodium chloride 0.9 % 100 mL IVPB (has no administration in time range)  azithromycin (ZITHROMAX) 500 mg in sodium chloride 0.9 % 250 mL IVPB (has no administration in time range)  iohexol (OMNIPAQUE) 350 MG/ML injection 100 mL (60 mLs Intravenous Contrast Given 06/03/22 1553)    ED Course/ Medical Decision Making/ A&P Clinical Course as of 06/03/22 1756  Thu Jun 03, 2022  1732 Dr. Renne Crigler [BM]    Clinical Course User Index [BM] Deliah Boston, PA-C                           Medical Decision Making 26 80-year-old female presented with her daughter for evaluation of gradually worsening shortness of breath and fatigue over the past 2 weeks, patient's daughter is also concerned the patient has had some coughing whenever she drinks liquids over the past few weeks as well.  She denies any associated chest pain but does report bilateral lower leg pain left worse than right without associated swelling.  She has no infectious symptoms.  She was noted to be hypoxic by triage staff and placed on supplemental oxygen, she is currently resting comfortably on 3 L nasal  cannula.  Differential includes but not limited to CHF exacerbation, COPD exacerbation, PE, PNA, ACS, PTX, pleural effusion.  Labs ordered include CBC, BMP, BNP, troponin, D-dimer.  Imaging ordered includes chest x-ray, lower extremity Doppler.  EKG ordered.  Amount and/or Complexity of Data Reviewed Labs: ordered.    Details: COVID/influenza panel negative. CBC shows no leukocytosis to suggest infectious process, hemoglobin 4.9 no evidence for symptomatic anemia, no thrombocytopenia. High sensitive troponin within normal limits, reassuring in the setting of no chest pain Avila suspicion for ACS. D-dimer elevated at 2.14 this prompted CTA chest to evaluate for pulmonary embolism. BNP within normal limits, low suspicion for CHF exacerbation. BMP shows no emergent electrolyte derangement, AKI or gap. Radiology: ordered and independent interpretation performed.    Details: I personally reviewed and interpreted patient's two-view chest x-ray.  Patient appears to have pleural effusions right greater than left.  I do not appreciate any obvious PTX or PNA. I personally reviewed and interpreted  patient's CT angio chest: I agree with radiologist that patient has a large right pleural effusion with apparent infiltrate.  I do not appreciate any obvious saddle embolism.  Please see radiologist interpretation.  Risk Prescription drug management. Decision regarding hospitalization. Risk Details: Patient and both of her daughters were updated on CT findings today, they are aware that there is concern for possible malignancy as cause of patient's shortness of breath and pleural effusions.  Given possibility of infiltrate/pneumonia patient was started on antibiotics today.  Case discussed with Dr. Rogene Houston who agrees with care plan.  Patient requires admission to the hospital this point for new oxygen requirement.  Patient and her daughters are both in agreement with plan.  I consulted with hospitalist team and  spoke with Dr. Cruzita Lederer who accepted patient for admission.  Critical Care Total time providing critical care: 35 minutes     Note: Portions of this report may have been transcribed using voice recognition software. Every effort was made to ensure accuracy; however, inadvertent computerized transcription errors may still be present.         Final Clinical Impression(s) / ED Diagnoses Final diagnoses:  Acute hypoxic respiratory failure [facility])    Rx / DC Orders ED Discharge Orders     None         Gari Crown 06/03/22 1756    Fredia Sorrow, MD 06/12/22 1719

## 2022-06-03 NOTE — ED Notes (Signed)
Late entry -- Pt awake and alert; GCS 15.  Lying in bed with 2 daughters at bedside.  Pt denies active pain and denies active sob - reports sob has been intermittent but increasing in frequency.  Pt on 2L O2 via [place] since arrival to ED - O2 sats stable.  Lung sounds CTA with exception of crackles to lower posterior bases.  S1 and S2 regular.  Abdomen soft, round, nontender with active BS x4.  Skin warm dry and intact.  No LE edema noted (pt report chronic leg pain x1 year).  Pt and family agreeable with plan for admission.  Will continue to monitor for acute changes and maintain plan of care as pt awaits [facility]

## 2022-06-03 NOTE — ED Notes (Signed)
Attempt IV placement unsuccessful

## 2022-06-03 NOTE — H&P (Incomplete)
History and Physical    [name] ZDG:[id number] DOB: 11/08/1927 DOA: 06/03/2022  PCP: Lucretia Kern, DO  Patient coming from: ***  I have personally briefly reviewed patient's old medical records in [place]  Chief Complaint: ***  HPI: [name] is a 88 y.o. female with medical history significant of    ED Course: ***  Review of Systems: As per HPI otherwise 10 point review of systems negative.   Past Medical History:  Diagnosis Date  . CAD (coronary artery disease)   . Carotid atherosclerosis   . Dyslipidemia   . H/O aortic dissection    Type B  . History of acute anterior wall MI 11/19/2007  . 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    Past Surgical History:  Procedure Laterality Date  . CORONARY ANGIOPLASTY WITH STENT PLACEMENT  11/21/2007   stenting of the RCA followed by staged LAD intervention  . CORONARY ARTERY BYPASS GRAFT  11/30/2007   LIMA to LAD,SVG to 1st and 2nd diagonal arteries  . NM MYOVIEW LTD  07/13/2010   normal     reports that she has never smoked. She has never used smokeless tobacco. She reports that she does not drink alcohol and does not use drugs.  Allergies  Allergen Reactions  . Lipitor [Atorvastatin]   . Simvastatin     Family History  Problem Relation Age of Onset  . Diabetes Mother   . Heart failure Mother   . Stroke Mother   . Heart failure Father    *** Prior to Admission medications   Medication Sig Start Date End Date Taking? Authorizing Provider  clopidogrel (PLAVIX) 75 MG tablet TAKE 1 TABLET BY MOUTH DAILY Patient not taking: Reported on 06/03/2022 10/31/17   Croitoru, Mihai, MD  nitroGLYCERIN (NITROLINGUAL) 0.4 MG/SPRAY spray Place 1 spray under the tongue every 5 (five) minutes x 3 doses as needed for chest pain. If no relief after that then call 911. Patient not taking: Reported on 06/03/2022 10/01/15   Croitoru, Dani Gobble, MD  pravastatin (PRAVACHOL) 40 MG tablet TAKE 1 TABLET BY  MOUTH EACH EVENING Patient not taking: Reported on 06/03/2022 10/31/17   Sanda Klein, MD    Physical Exam: Vitals:   06/03/22 1730 06/03/22 1930 06/03/22 2100 06/03/22 2204  BP:  124/87 (!) 134/99 (!) 140/85  Pulse:  95 90 93  Resp:  (!) 31 (!) 28 20  Temp: 97.9 F (36.6 C)  98.2 F (36.8 C) 98.6 F (37 C)  TempSrc:   Oral Oral  SpO2:  94% 94% 98%    Constitutional: NAD, calm, comfortable Vitals:   06/03/22 1730 06/03/22 1930 06/03/22 2100 06/03/22 2204  BP:  124/87 (!) 134/99 (!) 140/85  Pulse:  95 90 93  Resp:  (!) 31 (!) 28 20  Temp: 97.9 F (36.6 C)  98.2 F (36.8 C) 98.6 F (37 C)  TempSrc:   Oral Oral  SpO2:  94% 94% 98%   Eyes: PERRL, lids and conjunctivae normal ENMT: Mucous membranes are moist. Posterior pharynx clear of any exudate or lesions.Normal dentition.  Neck: normal, supple, no masses, no thyromegaly Respiratory: clear to auscultation bilaterally, no wheezing, no crackles. Normal respiratory effort. No accessory muscle use.  Cardiovascular: Regular rate and rhythm, no murmurs / rubs / gallops. No extremity edema. 2+ pedal pulses. No carotid bruits.  Abdomen: no tenderness, no masses palpated. No hepatosplenomegaly. Bowel sounds positive.  Musculoskeletal: no  clubbing / cyanosis. No joint deformity upper and lower extremities. Good ROM, no contractures. Normal muscle tone.  Skin: no rashes, lesions, ulcers. No induration Neurologic: CN 2-12 grossly intact. Sensation intact, DTR normal. Strength 5/5 in all 4.  Psychiatric: Normal judgment and insight. Alert and oriented x 3. Normal mood.    Labs on Admission: I have personally reviewed following labs and imaging studies  CBC: Recent Labs  Lab 06/03/22 1310  WBC 7.5  HGB 14.9  HCT 45.0  MCV 89.5  PLT 532   Basic Metabolic Panel: Recent Labs  Lab 06/03/22 1310  NA 139  K 5.2*  CL 106  CO2 23  GLUCOSE 106*  BUN 18  CREATININE 0.65  CALCIUM 9.6   GFR: CrCl cannot be calculated (Unknown  ideal weight.). Liver Function Tests: No results for input(s): "AST", "ALT", "ALKPHOS", "BILITOT", "PROT", "ALBUMIN" in the last 168 hours. No results for input(s): "LIPASE", "AMYLASE" in the last 168 hours. No results for input(s): "AMMONIA" in the last 168 hours. Coagulation Profile: No results for input(s): "INR", "PROTIME" in the last 168 hours. Cardiac Enzymes: No results for input(s): "CKTOTAL", "CKMB", "CKMBINDEX", "TROPONINI" in the last 168 hours. BNP (last 3 results) No results for input(s): "PROBNP" in the last 8760 hours. HbA1C: No results for input(s): "HGBA1C" in the last 72 hours. CBG: No results for input(s): "GLUCAP" in the last 168 hours. Lipid Profile: No results for input(s): "CHOL", "HDL", "LDLCALC", "TRIG", "CHOLHDL", "LDLDIRECT" in the last 72 hours. Thyroid Function Tests: No results for input(s): "TSH", "T4TOTAL", "FREET4", "T3FREE", "THYROIDAB" in the last 72 hours. Anemia Panel: No results for input(s): "VITAMINB12", "FOLATE", "FERRITIN", "TIBC", "IRON", "RETICCTPCT" in the last 72 hours. Urine analysis:    Component Value Date/Time   COLORURINE YELLOW 11/27/2007 1231   APPEARANCEUR HAZY (A) 11/27/2007 1231   LABSPEC 1.011 11/27/2007 1231   PHURINE 7.0 11/27/2007 1231   GLUCOSEU NEGATIVE 11/27/2007 1231   HGBUR NEGATIVE 11/27/2007 1231   BILIRUBINUR NEGATIVE 11/27/2007 1231   KETONESUR NEGATIVE 11/27/2007 1231   PROTEINUR NEGATIVE 11/27/2007 1231   UROBILINOGEN 4.0 (H) 11/27/2007 1231   NITRITE NEGATIVE 11/27/2007 1231   LEUKOCYTESUR SMALL (A) 11/27/2007 1231    Radiological Exams on Admission: US Venous Img Lower Bilateral  Result Date: 06/03/2022 CLINICAL DATA:  Calf pain EXAM: Bilateral LOWER EXTREMITY VENOUS DOPPLER ULTRASOUND TECHNIQUE: Gray-scale sonography with compression, as well as color and duplex ultrasound, were performed to evaluate the deep venous system(s) from the level of the common femoral vein through the popliteal and proximal  calf veins. COMPARISON:  None Available. FINDINGS: VENOUS Normal compressibility of the common femoral, superficial femoral, and popliteal veins, as well as the visualized calf veins. Visualized portions of profunda femoral vein and great saphenous vein unremarkable. No filling defects to suggest DVT on grayscale or color Doppler imaging. Doppler waveforms show normal direction of venous flow, normal respiratory plasticity and response to augmentation. OTHER None. Limitations: none IMPRESSION: Negative. Electronically Signed   By: Donavan Foil M.D.   On: 06/03/2022 19:51   CT Angio Chest PE W and/or Wo Contrast  Result Date: 06/03/2022 CLINICAL DATA:  Chest pain and shortness of breath. EXAM: CT ANGIOGRAPHY CHEST WITH CONTRAST TECHNIQUE: Multidetector CT imaging of the chest was performed using the standard protocol during bolus administration of intravenous contrast. Multiplanar CT image reconstructions and MIPs were obtained to evaluate the vascular anatomy. RADIATION DOSE REDUCTION: This exam was performed according to the departmental dose-optimization program which includes automated exposure control, adjustment of  the mA and/or kV according to patient size and/or use of iterative reconstruction technique. CONTRAST:  38m OMNIPAQUE IOHEXOL 350 MG/ML SOLN COMPARISON:  Chest x-ray, same date. FINDINGS: Cardiovascular: The heart is normal in size. No pericardial effusion. The ascending thoracic aorta is upper limits of size. No dissection. Scattered atherosclerotic calcifications. 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 The pulmonary arterial tree is well opacified. No filling defects to suggest pulmonary embolism. Mediastinum/Nodes: Mediastinal and hilar lymphadenopathy. Left paratracheal node measures 10 mm on image 26/4. Precarinal node on image 46/4 measures 13 mm. Subcarinal adenopathy measures 18 mm on image 55/4. There is also  pre-vascular and AP window adenopathy and bilateral hilar adenopathy. The esophagus is grossly normal. Lungs/Pleura: Large right pleural effusion with significant overlying atelectasis and infiltrate. No discrete pulmonary mass is identified. Marked soft tissue thickening around the right hilum and right infrahilar region. Small left effusion and overlying atelectasis. Upper Abdomen: Enlarged upper abdominal lymph nodes including a 12 mm celiac axis node and retroperitoneal nodes. No hepatic or adrenal gland lesions. Advanced aortic calcifications. 14 mm calcified splenic artery aneurysm. Musculoskeletal: Asymmetric density involving the right breast. Could not exclude a mass. There is also enlarged right axillary lymph nodes which are worrisome. The bony thorax is intact. Review of the MIP images confirms the above findings. IMPRESSION: 1. No CT findings for pulmonary embolism. 2. Large right pleural effusion with significant overlying atelectasis and infiltrate. Marked soft tissue thickening around the right hilum and right infrahilar region. 3. Small left effusion and overlying atelectasis. 4. Mediastinal, hilar and upper abdominal lymphadenopathy worrisome for metastatic disease. 5. Asymmetric density involving the right breast and possible 5 x 3 cm mass. Recommend correlation with breast exam and mammogram and ultrasound as indicated. Right axillary adenopathy is worrisome. 6. Aortic atherosclerosis. Aortic Atherosclerosis (ICD10-I70.0). Electronically Signed   By: PMarijo SanesM.D.   On: 06/03/2022 16:23   DG Chest 2 View  Result Date: 06/03/2022 CLINICAL DATA:  Shortness of breath EXAM: CHEST - 2 VIEW COMPARISON:  01/02/2008 FINDINGS: Status post median sternotomy. Normal cardiac and mediastinal contours given AP technique. Moderate right and small left pleural effusions with associated atelectasis. No pneumothorax. No acute osseous abnormality. IMPRESSION: Moderate right and small left pleural effusions  with associated atelectasis. Electronically Signed   By: AMerilyn BabaM.D.   On: 06/03/2022 13:45    EKG: Independently reviewed. ***  Assessment/Plan Principal Problem:   Pleural effusion on right   ***  DVT prophylaxis: *** (Lovenox/Heparin/SCD's/anticoagulated/None (if comfort care) Code Status: *** (Full/Partial (specify details) Family Communication: *** (Specify name, relationship. Do not write "discussed with patient". Specify tel # if discussed over the phone) Disposition Plan: *** (specify when and where you expect patient to be discharged) Consults called: *** (with names) Admission status: *** (inpatient / obs / tele / medical floor / SDU)   SClance BollMD [facility] Pager [phone]  If 7PM-7AM, please contact night-coverage [email or web address] Password [email or web address]  06/03/2022, 11:07 PM

## 2022-06-03 NOTE — ED Triage Notes (Signed)
Pt is being brought in by her daughter for "shortness of breath and gurgling" for a couple of weeks and generalized weakness and soreness in her legs for about a year.  No acute distress in triage at rest, no CP, no edema.  Sob is increased with activity.

## 2022-06-03 NOTE — Plan of Care (Signed)
Plan of Care Note for accepted transfer   Patient: [name] MRN: [id number]   DOA: 06/03/2022  Facility requesting transfer: [facility] Requesting Provider: Nuala Alpha, PA-C Reason for transfer: right pleural effusion Facility course:  Ms. Herrig is a 88 yo female with PMH CAD s/p CABG, HLD, Type B aortic dissection, carotid atherosclerosis who presented to [facility] with SOB and generalized weakness.  Was found to be hypoxic and placed on 2 L oxygen. CXR was obtained which showed bilateral pleural effusions, greater on the right. Lab workup was notable for elevated D-dimer and negative BNP.  She then underwent CTA chest for PE workup.  Imaging was negative for PE but showed large right pleural effusion with overlying atelectasis and infiltrate.  There was also mediastinal, hilar, upper abdominal lymphadenopathy worrisome for metastatic disease.  There was also noted to be asymmetric density involving the right breast and a possible 5 x 3 cm mass.  Right axillary adenopathy also appreciated. Lower extremity duplex also ordered and pending.  Notable labs include: BNP normal (61), troponin 3. K 5.2; remainder of chemistries unremarkable CBC normal and unremarkable.  EKG: TWI in V2, V3 not present in prior. Minimal ST depression in V3 (<1 mm in V2).  Given prior cardiac history likely to have demand ischemia easily with physical stressors.   Would plan for right thoracentesis to help aid suspected diagnosis of malignant effusion and then pursue probable oncology and palliative consult as patient not likely a good treatment candidate.  Further workup to be determined on arrival.  Plan of care: The patient is accepted for admission to [facility]  unit, at [facility].  Author: Dwyane Dee, MD 06/03/2022  Check [email or web address] for on-call coverage.  Nursing staff, Please call [place] number on Amion as soon as patient's arrival, so appropriate admitting  provider can evaluate the pt.

## 2022-06-04 ENCOUNTER — Inpatient Hospital Stay (HOSPITAL_COMMUNITY): Payer: Medicare Other

## 2022-06-04 DIAGNOSIS — J9601 Acute respiratory failure with hypoxia: Secondary | ICD-10-CM | POA: Diagnosis not present

## 2022-06-04 DIAGNOSIS — J90 Pleural effusion, not elsewhere classified: Secondary | ICD-10-CM | POA: Diagnosis not present

## 2022-06-04 DIAGNOSIS — N6310 Unspecified lump in the right breast, unspecified quadrant: Secondary | ICD-10-CM | POA: Diagnosis not present

## 2022-06-04 LAB — RESPIRATORY PANEL BY PCR

## 2022-06-04 LAB — STREP PNEUMONIAE URINARY ANTIGEN: Strep Pneumo Urinary Antigen: NEGATIVE

## 2022-06-04 MED ORDER — SODIUM CHLORIDE 0.9 % IV SOLN
1.0000 g | INTRAVENOUS | Status: DC
  Administered 2022-06-04 – 2022-06-06 (×3): 1 g via INTRAVENOUS
  Filled 2022-06-04 (×3): qty 10

## 2022-06-04 MED ORDER — SODIUM CHLORIDE 0.9 % IV SOLN
500.0000 mg | INTRAVENOUS | Status: DC
  Administered 2022-06-04 – 2022-06-06 (×3): 500 mg via INTRAVENOUS
  Filled 2022-06-04 (×4): qty 5

## 2022-06-04 NOTE — Progress Notes (Signed)
IR was requested for image guided thoracentesis.   Per report, IR team attempted to send for the patient but primary team and family unsure if thoracentesis is needed.  Family now agreeable to proceed but due to transport delay, unable to perform thoracentesis today.    Thoracentesis will be performed tomorrow.  Please page on call IR radiologist for questions and concerns.   Armando Gang Shandrell Boda PA-C 06/04/2022 4:21 PM

## 2022-06-04 NOTE — Progress Notes (Signed)
Patient  has been stuck for three times per Lab and cannot be stuck again till 24 hours, on call informed,will continue to monitor.

## 2022-06-04 NOTE — Progress Notes (Signed)
[facility]  PROGRESS NOTE  [name] TZG:[id number] DOB: 1928/03/30 DOA: 06/03/2022 PCP: Lucretia Kern, DO   Brief HPI:   88 year old female with medical history of CAD s/p CABG, hyperlipidemia, aortic dissection was brought to ED by family for 2 to 3 weeks of fatigue, shortness of breath, congestion.  In the ED CT scan was negative for PE, showed right large pleural effusion with significant overlying atelectasis and infiltrate.  Asymmetric density involving right breast and possible 5 x 3 cm mass, mediastinal hilar and upper abdominal lymphadenopathy worrisome for metastatic disease.    Subjective   Patient seen and examined, requiring oxygen.  Denies shortness of breath.  No chest pain.   Assessment/Plan:   Right pleural effusion -Unclear etiology; concern for malignancy as patient also found to have right breast mass -Thoracentesis ordered -Will check pleural fluid analysis with glucose, protein, LDH, cytology -Patient was empirically started on ceftriaxone and Zithromax -Respiratory culture negative, strep pneumo urinary antigen negative -Check procalcitonin  Elevated D-dimer -Bilateral venous duplex of lower extremities negative for DVT -CTA chest negative for PE  Right breast mass -Seen on CT scan; 5 x 3 cm mass -Along with mediastinal, hilar and upper abdominal lymphadenopathy; worrisome for metastatic disease -Thoracentesis to be performed today, will send pleural fluid for cytology -Discussed with oncology Dr. Irene Limbo, he recommends getting CT abdomen and MRI brain to complete workup and patient can follow-up with him as outpatient  Acute hypoxemic respiratory failure -Likely from pleural effusion as above -Continue antibiotics for now -Thoracentesis has been ordered  CAD s/p CABG -No chest pain -Continue statin, nitro as needed  Mild hyperkalemia -Follow potassium level  Medications     heparin  5,000 Units Subcutaneous Q8H     Data Reviewed:    CBG:  No results for input(s): "GLUCAP" in the last 168 hours.  SpO2: 97 % O2 Flow Rate (L/min): 8 L/min FiO2 (%): 32 %    Vitals:   06/04/22 0537 06/04/22 0913 06/04/22 1410 06/04/22 1457  BP: 137/87 124/83  137/80  Pulse: 86 80  85  Resp: '20 17  17  '$ Temp: 98.3 F (36.8 C) 98.1 F (36.7 C)  98.3 F (36.8 C)  TempSrc: Oral Oral  Oral  SpO2: 91% 97%  97%  Weight:   61.3 kg   Height:   '5\' 2"'$  (1.575 m)       Data Reviewed:  Basic Metabolic Panel: Recent Labs  Lab 06/03/22 1310  NA 139  K 5.2*  CL 106  CO2 23  GLUCOSE 106*  BUN 18  CREATININE 0.65  CALCIUM 9.6    CBC: Recent Labs  Lab 06/03/22 1310  WBC 7.5  HGB 14.9  HCT 45.0  MCV 89.5  PLT 232    LFT No results for input(s): "AST", "ALT", "ALKPHOS", "BILITOT", "PROT", "ALBUMIN" in the last 168 hours.   Antibiotics: Anti-infectives (From admission, onward)    Start     Dose/Rate Route Frequency Ordered Stop   06/03/22 1700  cefTRIAXone (ROCEPHIN) 1 g in sodium chloride 0.9 % 100 mL IVPB        1 g 200 mL/hr over 30 Minutes Intravenous  Once 06/03/22 1654 06/03/22 1830   06/03/22 1700  azithromycin (ZITHROMAX) 500 mg in sodium chloride 0.9 % 250 mL IVPB        500 mg 250 mL/hr over 60 Minutes Intravenous  Once 06/03/22 1654 06/03/22 1902        DVT prophylaxis: Heparin  Code  Status: Full code  Family Communication: Discussed with patient's daughters at bedside   CONSULTS    Objective    Physical Examination:   General-appears in no acute distress Heart-S1-S2, regular, no murmur auscultated Lungs-decreased breath sounds on right Abdomen-soft, nontender, no organomegaly Extremities-no edema in the lower extremities Neuro-alert, oriented x3, no focal deficit noted   Status is: Inpatient: Pleural effusion       [place]   [facility] If 7PM-7AM, please contact night-coverage at [email or web address], Office  [phone]   06/04/2022, 6:17 PM  LOS: 1 day

## 2022-06-05 ENCOUNTER — Inpatient Hospital Stay (HOSPITAL_COMMUNITY): Payer: Medicare Other

## 2022-06-05 DIAGNOSIS — J910 Malignant pleural effusion: Secondary | ICD-10-CM

## 2022-06-05 DIAGNOSIS — N6310 Unspecified lump in the right breast, unspecified quadrant: Secondary | ICD-10-CM | POA: Diagnosis not present

## 2022-06-05 DIAGNOSIS — N63 Unspecified lump in unspecified breast: Secondary | ICD-10-CM | POA: Diagnosis not present

## 2022-06-05 DIAGNOSIS — J9601 Acute respiratory failure with hypoxia: Secondary | ICD-10-CM | POA: Diagnosis not present

## 2022-06-05 DIAGNOSIS — J90 Pleural effusion, not elsewhere classified: Secondary | ICD-10-CM | POA: Diagnosis not present

## 2022-06-05 LAB — CBC WITH DIFFERENTIAL/PLATELET
Abs Immature Granulocytes: 0.02 10*3/uL (ref 0.00–0.07)
Basophils Absolute: 0 10*3/uL (ref 0.0–0.1)
Basophils Relative: 1 %
Eosinophils Absolute: 0.2 10*3/uL (ref 0.0–0.5)
Eosinophils Relative: 4 %
HCT: 44.6 % (ref 36.0–46.0)
Hemoglobin: 14.6 g/dL (ref 12.0–15.0)
Immature Granulocytes: 0 %
Lymphocytes Relative: 20 %
Lymphs Abs: 1.2 10*3/uL (ref 0.7–4.0)
MCH: 30 pg (ref 26.0–34.0)
MCHC: 32.7 g/dL (ref 30.0–36.0)
MCV: 91.6 fL (ref 80.0–100.0)
Monocytes Absolute: 0.6 10*3/uL (ref 0.1–1.0)
Monocytes Relative: 9 %
Neutro Abs: 3.9 10*3/uL (ref 1.7–7.7)
Neutrophils Relative %: 66 %
Platelets: 216 10*3/uL (ref 150–400)
RBC: 4.87 MIL/uL (ref 3.87–5.11)
RDW: 13.5 % (ref 11.5–15.5)
WBC: 5.9 10*3/uL (ref 4.0–10.5)
nRBC: 0 % (ref 0.0–0.2)

## 2022-06-05 LAB — LACTATE DEHYDROGENASE, PLEURAL OR PERITONEAL FLUID: LD, Fluid: 143 U/L — ABNORMAL HIGH (ref 3–23)

## 2022-06-05 LAB — COMPREHENSIVE METABOLIC PANEL
ALT: 24 U/L (ref 0–44)
AST: 32 U/L (ref 15–41)
Albumin: 3.1 g/dL — ABNORMAL LOW (ref 3.5–5.0)
Alkaline Phosphatase: 99 U/L (ref 38–126)
Anion gap: 10 (ref 5–15)
BUN: 10 mg/dL (ref 8–23)
CO2: 20 mmol/L — ABNORMAL LOW (ref 22–32)
Calcium: 8.8 mg/dL — ABNORMAL LOW (ref 8.9–10.3)
Chloride: 110 mmol/L (ref 98–111)
Creatinine, Ser: 0.73 mg/dL (ref 0.44–1.00)
GFR, Estimated: >60 mL/min (ref >60)
Glucose, Bld: 148 mg/dL — ABNORMAL HIGH (ref 70–99)
Potassium: 3.9 mmol/L (ref 3.5–5.1)
Sodium: 140 mmol/L (ref 135–145)
Total Bilirubin: 1 mg/dL (ref 0.3–1.2)
Total Protein: 6.5 g/dL (ref 6.5–8.1)

## 2022-06-05 LAB — GRAM STAIN: Gram Stain: NONE SEEN

## 2022-06-05 LAB — PROTEIN, PLEURAL OR PERITONEAL FLUID: Total protein, fluid: 3.7 g/dL

## 2022-06-05 LAB — GLUCOSE, PLEURAL OR PERITONEAL FLUID: Glucose, Fluid: 114 mg/dL

## 2022-06-05 LAB — PROCALCITONIN: Procalcitonin: <0.1 ng/mL

## 2022-06-05 LAB — ALBUMIN, PLEURAL OR PERITONEAL FLUID: Albumin, Fluid: 2.5 g/dL

## 2022-06-05 LAB — LACTATE DEHYDROGENASE: LDH: 192 U/L (ref 98–192)

## 2022-06-05 MED ORDER — LIDOCAINE HCL (PF) 1 % IJ SOLN
INTRAMUSCULAR | Status: AC
  Filled 2022-06-05: qty 30

## 2022-06-05 MED ORDER — ASPIRIN 81 MG PO CHEW
81.0000 mg | CHEWABLE_TABLET | Freq: Every day | ORAL | Status: DC
  Administered 2022-06-05 – 2022-06-07 (×3): 81 mg via ORAL
  Filled 2022-06-05 (×3): qty 1

## 2022-06-05 MED ORDER — LIDOCAINE HCL (PF) 1 % IJ SOLN: 5.0000 mL | Freq: Once | INTRAMUSCULAR | Status: DC

## 2022-06-05 NOTE — Progress Notes (Signed)
[facility]  PROGRESS NOTE  [name] NOT:[id number] DOB: 06/22/1928 DOA: 06/03/2022 PCP: Lucretia Kern, DO   Brief HPI:   88 year old female with medical history of CAD s/p CABG, hyperlipidemia, aortic dissection was brought to ED by family for 2 to 3 weeks of fatigue, shortness of breath, congestion.  In the ED CT scan was negative for PE, showed right large pleural effusion with significant overlying atelectasis and infiltrate.  Asymmetric density involving right breast and possible 5 x 3 cm mass, mediastinal hilar and upper abdominal lymphadenopathy worrisome for metastatic disease.    Subjective   Patient seen and examined, denies pain or shortness of breath.  Plan for thoracentesis today.   Assessment/Plan:   Right pleural effusion -Unclear etiology; concern for malignancy as patient also found to have right breast mass -Thoracentesis ordered -Will check pleural fluid analysis with glucose, protein, LDH, cytology -Patient was empirically started on ceftriaxone and Zithromax -Respiratory culture negative, strep pneumo urinary antigen negative -Check procalcitonin  Elevated D-dimer -Bilateral venous duplex of lower extremities negative for DVT -CTA chest negative for PE  Right breast mass -Seen on CT scan; 5 x 3 cm mass -Along with mediastinal, hilar and upper abdominal lymphadenopathy; worrisome for metastatic disease -Thoracentesis to be performed today, will send pleural fluid for cytology -Discussed with oncology Dr. Irene Limbo, he recommends getting CT abdomen and MRI brain to complete workup and patient can follow-up with him as outpatient  Stroke -MRI brain obtained yesterday showed patchy restricted diffusion involving the left lentiform and caudate nuclei, consistent with acute to 30 subacute ischemic infarct. -Additional small focus of mild diffusion abnormality involving the contralateral high posterior right frontal centrum semiovale likely small focus of  evolving subacute ischemia -Few scattered remote lacunar infarcts involving thalami and pons -Will consult neurology for further evaluation  Hydronephrosis -CT abdomen/pelvis obtained for workup for metastatic disease -Did not show metastasis however showed hydronephrosis involving the left kidney  Acute hypoxemic respiratory failure -Likely from pleural effusion as above -Continue antibiotics for now -Thoracentesis has been ordered; likely today  CAD s/p CABG -No chest pain -Continue statin, nitro as needed  Mild hyperkalemia -Follow potassium level  Medications     heparin  5,000 Units Subcutaneous Q8H     Data Reviewed:   CBG:  No results for input(s): "GLUCAP" in the last 168 hours.  SpO2: 95 % O2 Flow Rate (L/min): 8 L/min FiO2 (%): 32 %    Vitals:   06/04/22 1457 06/04/22 1925 06/05/22 0427 06/05/22 0855  BP: 137/80 (!) 148/92 (!) 124/57 130/79  Pulse: 85 88 85 88  Resp: '17 18 16 18  '$ Temp: 98.3 F (36.8 C) 98.2 F (36.8 C) 98.2 F (36.8 C) 98.1 F (36.7 C)  TempSrc: Oral Oral Oral Oral  SpO2: 97% 96% 96% 95%  Weight:      Height:          Data Reviewed:  Basic Metabolic Panel: Recent Labs  Lab 06/03/22 1310  NA 139  K 5.2*  CL 106  CO2 23  GLUCOSE 106*  BUN 18  CREATININE 0.65  CALCIUM 9.6    CBC: Recent Labs  Lab 06/03/22 1310  WBC 7.5  HGB 14.9  HCT 45.0  MCV 89.5  PLT 232    LFT No results for input(s): "AST", "ALT", "ALKPHOS", "BILITOT", "PROT", "ALBUMIN" in the last 168 hours.   Antibiotics: Anti-infectives (From admission, onward)    Start     Dose/Rate Route Frequency Ordered Stop  06/04/22 1930  cefTRIAXone (ROCEPHIN) 1 g in sodium chloride 0.9 % 100 mL IVPB        1 g 200 mL/hr over 30 Minutes Intravenous Every 24 hours 06/04/22 1840     06/04/22 1930  azithromycin (ZITHROMAX) 500 mg in sodium chloride 0.9 % 250 mL IVPB        500 mg 250 mL/hr over 60 Minutes Intravenous Every 24 hours 06/04/22 1840      06/03/22 1700  cefTRIAXone (ROCEPHIN) 1 g in sodium chloride 0.9 % 100 mL IVPB        1 g 200 mL/hr over 30 Minutes Intravenous  Once 06/03/22 1654 06/03/22 1830   06/03/22 1700  azithromycin (ZITHROMAX) 500 mg in sodium chloride 0.9 % 250 mL IVPB        500 mg 250 mL/hr over 60 Minutes Intravenous  Once 06/03/22 1654 06/03/22 1902        DVT prophylaxis: Heparin  Code Status: Full code  Family Communication: Discussed with patient's daughters at bedside   CONSULTS    Objective    Physical Examination:   Appears in no acute distress S1-S2, regular, no murmur auscultated Reduced breath sounds right lung fields Abdominal soft, nontender No edema in the lower extremities   Status is: Inpatient: Pleural effusion       [place]   [facility] If 7PM-7AM, please contact night-coverage at [email or web address], Office  [phone]   06/05/2022, 10:06 AM  LOS: 2 days

## 2022-06-05 NOTE — Consult Note (Signed)
Neurology Consultation  Reason for Consult: Subacute strokes in the left lentiform and caudate nuclei as well as the right central centrum semiovale Referring Physician: Dr. Darrick Meigs  CC: Shortness of breath  History is obtained from: Patient, family and chart  HPI: [name] is a 88 y.o. female with history of CAD and CABG, hyperlipidemia, aortic dissection and carotid atherosclerosis who originally presented with shortness of breath and generalized weakness.  She was found to have bilateral pleural effusions as well as right breast mass along with mediastinal hilar and upper abdominal lymphadenopathy.  She is undergoing workup for metastatic disease and has had a thoracentesis for analysis of her pleural fluid today.  MRI brain shows subacute strokes in the left lentiform and caudate nuclei as well as in the right centrum semiovale.  Patient denies any unilateral weakness, numbness or difficulties in speech, but states she does have a family history of stroke.  She has no history of atrial fibrillation.   LKW: Unclear, no symptoms TNK given?: no, too mild to treat IR Thrombectomy? No, no LVO Modified Rankin Scale: 1-No significant post stroke disability and can perform usual duties with stroke symptoms  ROS: A complete ROS was performed and is negative except as noted in the HPI.   Past Medical History:  Diagnosis Date   CAD (coronary artery disease)    Carotid atherosclerosis    Dyslipidemia    H/O aortic dissection    Type B   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     Family History  Problem Relation Age of Onset   Diabetes Mother    Heart failure Mother    Stroke Mother    Heart failure Father      Social History:   reports that she has never smoked. She has never used smokeless tobacco. She reports that she does not drink alcohol and does not use drugs.  Medications  Current  Facility-Administered Medications:    albuterol (PROVENTIL) (2.5 MG/3ML) 0.083% nebulizer solution 2.5 mg, 2.5 mg, Nebulization, Q2H PRN, Dwyane Dee, MD   azithromycin (ZITHROMAX) 500 mg in sodium chloride 0.9 % 250 mL IVPB, 500 mg, Intravenous, Q24H, Darrick Meigs, Marge Duncans, MD, Stopped at 06/04/22 2352   cefTRIAXone (ROCEPHIN) 1 g in sodium chloride 0.9 % 100 mL IVPB, 1 g, Intravenous, Q24H, Darrick Meigs, Marge Duncans, MD, Stopped at 06/04/22 2108   heparin injection 5,000 Units, 5,000 Units, Subcutaneous, Q8H, Myles Rosenthal A, MD, 5,000 Units at 06/05/22 0622   lidocaine (PF) (XYLOCAINE) 1 % injection 5 mL, 5 mL, Intradermal, Once, Han, Aimee H, PA-C   Exam: Current vital signs: BP (!) 159/102   Pulse 88   Temp 98.1 F (36.7 C) (Oral)   Resp 18   Ht _0  (1.575 m)   Wt 61.3 kg   SpO2 95%   BMI 24.72 kg/m  Vital signs in last 24 hours: Temp:  [98.1 F (36.7 C)-98.2 F (36.8 C)] 98.1 F (36.7 C) (12/09 0855) Pulse Rate:  [85-88] 88 (12/09 0855) Resp:  [16-18] 18 (12/09 0855) BP: (124-159)/(57-102) 159/102 (12/09 1135) SpO2:  [95 %-96 %] 95 % (12/09 0855)  GENERAL: Awake, alert, in no acute distress Psych: Affect appropriate for situation, patient is calm and cooperative with examination Head: Normocephalic and atraumatic, without obvious abnormality EENT: Normal conjunctivae, moist mucous membranes, no OP obstruction LUNGS: Normal respiratory effort. Non-labored breathing on supplemental O2  Extremities: warm, well perfused, without obvious deformity  NEURO:  Mental Status: Awake, alert, and oriented to person, place, time, and situation. She is able to provide a clear and coherent history of present illness. Speech/Language: speech is clear and fluent.   Fluency, and comprehension intact without aphasia  No neglect is noted Cranial Nerves:  II: PERRL visual fields full.  III, IV, VI: EOMI. Lid elevation symmetric and full.  V: Sensation is intact to light touch and symmetrical to  face.  VII: Face is symmetric resting and smiling.  VIII: Hearing intact to voice IX, X: Phonation normal.  XI: Normal sternocleidomastoid and trapezius muscle strength XII: Tongue protrudes midline without fasciculations.   Motor: 5/5 strength is all muscle groups.  Tone is normal. Bulk is normal.  Sensation: Intact to light touch bilaterally in all four extremities. No extinction to DSS present.  Coordination: FTN intact bilaterally.  No pronator drift.  Gait: Deferred  NIHSS: 1a Level of Conscious.: 0 1b LOC Questions: 0 1c LOC Commands: 0 2 Best Gaze: 0 3 Visual: 0 4 Facial Palsy: 0 5a Motor Arm - left: 0 5b Motor Arm - Right: 0 6a Motor Leg - Left:0  6b Motor Leg - Right: 0 7 Limb Ataxia: 0 8 Sensory: 0 9 Best Language: 0 10 Dysarthria: 0 11 Extinct. and Inatten.: 0 TOTAL: 0   Labs I have reviewed labs in epic and the results pertinent to this consultation are:   CBC    Component Value Date/Time   WBC 5.9 06/05/2022 1047   RBC 4.87 06/05/2022 1047   HGB 14.6 06/05/2022 1047   HCT 44.6 06/05/2022 1047   PLT 216 06/05/2022 1047   MCV 91.6 06/05/2022 1047   MCH 30.0 06/05/2022 1047   MCHC 32.7 06/05/2022 1047   RDW 13.5 06/05/2022 1047   LYMPHSABS 1.2 06/05/2022 1047   MONOABS 0.6 06/05/2022 1047   EOSABS 0.2 06/05/2022 1047   BASOSABS 0.0 06/05/2022 1047    CMP     Component Value Date/Time   NA 140 06/05/2022 1047   K 3.9 06/05/2022 1047   CL 110 06/05/2022 1047   CO2 20 (L) 06/05/2022 1047   GLUCOSE 148 (H) 06/05/2022 1047   BUN 10 06/05/2022 1047   CREATININE 0.73 06/05/2022 1047   CREATININE 0.68 03/24/2016 1025   CALCIUM 8.8 (L) 06/05/2022 1047   PROT 6.5 06/05/2022 1047   ALBUMIN 3.1 (L) 06/05/2022 1047   AST 32 06/05/2022 1047   ALT 24 06/05/2022 1047   ALKPHOS 99 06/05/2022 1047   BILITOT 1.0 06/05/2022 1047   GFRNONAA >60 06/05/2022 1047   GFRAA  12/02/2007 0417    >60        The eGFR has been calculated using the MDRD  equation. This calculation has not been validated in all clinical    Lipid Panel     Component Value Date/Time   CHOL 255 (H) 03/24/2016 1025   TRIG 128 03/24/2016 1025   HDL 62 03/24/2016 1025   CHOLHDL 4.1 03/24/2016 1025   VLDL 26 03/24/2016 1025   LDLCALC 167 (H) 03/24/2016 1025     Imaging I have reviewed the images obtained:  MRI examination of the brain: Acute or early subacute infarcts in left lentiform and caudate nuclei as well as in the right frontal centrum semiovale, atrophy and chronic microvascular ischemic disease  Assessment: 88 year old patient with history of CAD, CABG, hyperlipidemia, aortic dissection and carotid atherosclerosis originally presented with shortness of breath and was found  to have large right pleural effusion.  She also has a right breast mass along with concerning hilar, mediastinal and upper abdominal lymphadenopathy and is undergoing workup for metastatic disease.  On MRI brain, 3 subacute strokes were noted in the left lentiform and caudate nuclei as well as in the right frontal centrum semiovale.  Patient denies any symptoms from the strokes, but states she has a family history of stroke.  Pattern of strokes appears embolic, but patient states that she has no history of A-fib.  May be worthwhile to do 30-day outpatient cardiac monitor, depending on results of metastatic disease workup.  Impression: 3 subacute incidental strokes in left lentiform and caudate nuclei as well as in right frontal centrum semiovale  Recommendations: Stroke/TIA Workup  - Admit for stroke workup - MRI brain with contrast would be useful for metastatic disease workup, but patient's renal function is too poor for this test - MRA head and carotid ultrasound  - TTE  - Check A1c and LDL + add statin per guidelines, although patient has had previous intolerance to statins and may decide to pursue other options -Hold off on dual antiplatelet therapy given possible need for  biopsy in coming days - q4 hr neuro checks - STAT head CT for any change in neuro exam - Tele - PT/OT/SLP - Stroke education - Amb referral to neurology upon discharge   -Consider 30-day outpatient cardiac monitor, depending upon results of metastatic disease workup and patient's goals of care  Pt seen by NP/Neuro and later by MD. Note/plan to be edited by MD as needed.  [place] , MSN, AGACNP-BC [facility]   NEUROHOSPITALIST ADDENDUM Performed a face to face diagnostic evaluation.   I have reviewed the contents of history and physical exam as documented by PA/ARNP/Resident and agree with above documentation.  I have discussed and formulated the above plan as documented. Edits to the note have been made as needed.  Donnetta Simpers, MD [facility] [phone]   If 7pm to 7am, please call on call as listed on AMION.

## 2022-06-05 NOTE — Procedures (Signed)
PROCEDURE SUMMARY:  Successful image-guided right thoracentesis. Yielded 650 mL of hazy yellow fluid. Pt tolerated procedure well. No immediate complications. EBL = trace   Specimen was sent for labs. CXR ordered.  Please see imaging section of Epic for full dictation.  Armando Gang Dianne Whelchel PA-C 06/05/2022 12:23 PM

## 2022-06-05 NOTE — Consult Note (Signed)
Marland Kitchen   HEMATOLOGY/ONCOLOGY CONSULTATION NOTE  Date of Service: 06/05/2022  Patient Care Team: Lucretia Kern, DO as PCP - General (Family Medicine)  CHIEF COMPLAINTS/PURPOSE OF CONSULTATION:  Concern for metastatic breast cancer with malignant pleural effusion  HISTORY OF PRESENTING ILLNESS:   [name] is a wonderful 88 y.o. female who has been referred to [place] by Dr Eleonore Chiquito MD for evaluation and management of possible metastatic breast cancer with malignant pleural effusion. Patient was seen with her 2 daughters at bedside.  Patient has a history of coronary disease status post CABG, hypertension, dyslipidemia, type B aortic dissection and was brought to the emergency room by her family due to increased shortness of breath fatigue cough and chest wall discomfort with coughing and breathing over the last 2 to 3 weeks.  Patient notes that she has had a right breast lump for about 3 years and has noted this has been growing over the last 6 months and is noticeably larger in size. She notes that she has been gradually more fatigued and short of breath and had chest soreness especially on the right side with breathing and coughing.  She has noticed this for more than 6 months but this got much worse over the last 2 to 3 weeks.  She notes that she did not tell her family or her primary doctor about her breast lump or her other symptoms since she did not want to bother them. 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  In the emergency room the patient had a CTA of the chest/12/2021 which showed no evidence of pulmonary embolism.  She was noted to have large right pleural effusion with significant overlying atelectasis and infiltrate.  Marked soft tissue thickening around the right hilum and  right infrahilar region.  Small left pleural effusion.  Mediastinal, hilar and upper abdominal lymphadenopathy worrisome for metastatic disease.  Noted to have a possible 5 x 3 cm mass in her right breast and right axillary lymphadenopathy.  Patient had a CT abdomen pelvis to complete her staging which showed no overt evidence of metastatic disease other than lymph nodes in the upper abdomen.  MRI brain done for staging showed no evidence of intracranial mass or evidence of metastatic disease. Did show some concerns for possible acute to subacute ischemic infarcts. She has not had any focal motor symptoms though she does note some weakness in her lower extremities and also her daughters note some difficulties with swallowing.  Neurology has been consulted and have ordered MRI of the brain, TTE, PT/OT and speech evaluations and outpatient referral to neurology on discharge.  She had a diagnostic and therapeutic thoracentesis today and notes that her breathing is a better since this procedure. We had detailed discussions of all her available imaging results.  MEDICAL HISTORY:  Past Medical History:  Diagnosis Date   CAD (coronary artery disease)    Carotid atherosclerosis    Dyslipidemia    H/O aortic dissection    Type B   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    SURGICAL HISTORY: Past Surgical History:  Procedure Laterality Date   CORONARY ANGIOPLASTY WITH STENT PLACEMENT  11/21/2007   stenting of the RCA followed by staged LAD intervention   CORONARY ARTERY BYPASS GRAFT  11/30/2007   LIMA to LAD,SVG to 1st and 2nd diagonal arteries   NM MYOVIEW LTD  07/13/2010   normal    SOCIAL HISTORY: Social History   Socioeconomic History   Marital status: Widowed    Spouse name: Not on file   Number of children: Not on file   Years of education: Not on file   Highest education level: Not on file   Occupational History   Not on file  Tobacco Use   Smoking status: Never   Smokeless tobacco: Never  Substance and Sexual Activity   Alcohol use: No    Alcohol/week: 0.0 standard drinks of alcohol   Drug use: No   Sexual activity: Not on file  Other Topics Concern   Not on file  Social History Narrative   Not on file   Social Determinants of Health   Financial Resource Strain: Not on file  Food Insecurity: No Food Insecurity (06/04/2022)   Hunger Vital Sign    Worried About Running Out of Food in the Last Year: Never true    Ran Out of Food in the Last Year: Never true  Transportation Needs: No Transportation Needs (06/04/2022)   PRAPARE - Hydrologist (Medical): No    Lack of Transportation (Non-Medical): No  Physical Activity: Not on file  Stress: Not on file  Social Connections: Not on file  Intimate Partner Violence: Not At Risk (06/04/2022)   Humiliation, Afraid, Rape, and Kick questionnaire    Fear of Current or Ex-Partner: No    Emotionally Abused: No    Physically Abused: No    Sexually Abused: No    FAMILY HISTORY: Family History  Problem Relation Age of Onset   Diabetes Mother    Heart failure Mother    Stroke Mother    Heart failure Father     ALLERGIES:  is allergic to lipitor [atorvastatin] and simvastatin.  MEDICATIONS:  Current Facility-Administered Medications  Medication Dose Route Frequency Provider Last Rate Last Admin   albuterol (PROVENTIL) (2.5 MG/3ML) 0.083% nebulizer solution 2.5 mg  2.5 mg Nebulization Q2H PRN Dwyane Dee, MD       azithromycin (ZITHROMAX) 500 mg in sodium chloride 0.9 % 250 mL IVPB  500 mg Intravenous Q24H Oswald Hillock, MD   Stopped at 06/04/22 2352   cefTRIAXone (ROCEPHIN) 1 g in sodium chloride 0.9 % 100 mL IVPB  1 g Intravenous Q24H Oswald Hillock, MD   Stopped at 06/04/22 2108   heparin injection 5,000 Units  5,000 Units Subcutaneous Q8H Clance Boll, MD   5,000 Units at 06/05/22  [id number]    REVIEW OF SYSTEMS:    10 Point review of Systems was done is negative except as noted above.  PHYSICAL EXAMINATION: ECOG PERFORMANCE STATUS: 2 - Symptomatic, <50% confined to bed  . Vitals:   06/05/22 0427 06/05/22 0855  BP: (!) 124/57 130/79  Pulse: 85 88  Resp: 16 18  Temp: 98.2 F (36.8 C) 98.1 F (36.7 C)  SpO2: 96% 95%   Filed Weights   06/04/22 1410  Weight: 135 lb 2.3 oz (61.3 kg)   .Body mass index is 24.72 kg/m.  GENERAL:alert, in no acute distress and comfortable SKIN: no acute rashes, no significant lesions EYES: conjunctiva are pink and non-injected, sclera anicteric OROPHARYNX: MMM, no exudates, no oropharyngeal erythema or ulceration NECK: supple, no  JVD LYMPH:  no palpable lymphadenopathy in the cervical, axillary or inguinal regions LUNGS: clear to auscultation b/l decreased air entry right lung base HEART: regular rate & rhythm ABDOMEN:  normoactive bowel sounds , non tender, not distended. Extremity: no pedal edema PSYCH: alert & oriented x 3 with fluent speech NEURO: no focal motor/sensory deficits  LABORATORY DATA:  I have reviewed the data as listed  .    Latest Ref Rng & Units 06/03/2022    1:10 PM 04/18/2013   10:31 AM 01/08/2013   10:55 AM  CBC  WBC 4.0 - 10.5 K/uL 7.5  5.8  6.2   Hemoglobin 12.0 - 15.0 g/dL 14.9  14.4  14.6   Hematocrit 36.0 - 46.0 % 45.0  41.7  41.8   Platelets 150 - 400 K/uL 232  221  220     .    Latest Ref Rng & Units 06/03/2022    1:10 PM 03/24/2016   10:25 AM 08/15/2014   11:26 AM  CMP  Glucose 70 - 99 mg/dL 106  111  88   BUN 8 - 23 mg/dL _0 Creatinine 0.44 - 1.00 mg/dL 0.65  0.68  0.55   Sodium 135 - 145 mmol/L 139  142  142   Potassium 3.5 - 5.1 mmol/L 5.2  4.7  4.4   Chloride 98 - 111 mmol/L 106  108  107   CO2 22 - 32 mmol/L _1 Calcium 8.9 - 10.3 mg/dL 9.6  9.3  8.8   Total Protein 6.1 - 8.1 g/dL  6.6  6.6   Total Bilirubin 0.2 - 1.2 mg/dL  1.0  1.0   Alkaline Phos 33  - 130 U/L  67  87   AST 10 - 35 U/L  26  31   ALT 6 - 29 U/L  23  35      RADIOGRAPHIC STUDIES: I have personally reviewed the radiological images as listed and agreed with the findings in the report. MR BRAIN WO CONTRAST  Result Date: 06/05/2022 CLINICAL DATA:  Initial evaluation for brain metastases. EXAM: MRI HEAD WITHOUT CONTRAST TECHNIQUE: Multiplanar, multiecho pulse sequences of the brain and surrounding structures were obtained without intravenous contrast. COMPARISON:  None available. FINDINGS: Brain: Diffuse prominence of the CSF containing spaces compatible generalized cerebral atrophy. Patchy and confluent T2/FLAIR hyperintensity involving the periventricular deep white matter both cerebral hemispheres, consistent with chronic microvascular ischemic disease, advanced in nature. Few scattered superimposed remote lacunar infarcts present about the thalami and pons. Patchy restricted diffusion involving the left lentiform and caudate nuclei, consistent with acute to early subacute ischemic infarct (series 5, images 78, 82). No associated hemorrhage or mass effect. Additional small focus of mild diffusion abnormality at the contralateral high posterior right frontal centrum semi ovale, likely a small focus of evolving subacute ischemia (series 5, image 90). Otherwise, no other acute or subacute ischemic changes are seen. Gray-white matter differentiation otherwise maintained. No acute or chronic intracranial blood products. No mass lesion, midline shift or mass effect. Diffuse ventricular prominence related to global parenchymal volume loss without hydrocephalus. No extra-axial fluid collection. Pituitary gland suprasellar region within normal limits. Vascular: Major intracranial vascular flow voids are maintained. Skull and upper cervical spine: Craniocervical junctional limits. Bone marrow signal intensity normal. No focal marrow replacing lesion. No scalp soft tissue abnormality. Sinuses/Orbits:  Patient status post bilateral ocular lens replacement. Mild scattered mucosal thickening noted about the ethmoidal air  cells and maxillary sinuses. Trace right mastoid effusion noted, of doubtful significance. Other: None. IMPRESSION: 1. Patchy restricted diffusion involving the left lentiform and caudate nuclei, consistent with acute to early subacute ischemic infarct. No associated hemorrhage or mass effect. 2. Additional small focus of mild diffusion abnormality involving the contralateral high posterior right frontal centrum semi ovale, likely a small focus of evolving subacute ischemia. 3. Underlying age-related cerebral atrophy with advanced chronic microvascular ischemic disease, with a few scattered remote lacunar infarcts about the thalami and pons. 4. No intracranial mass or evidence for metastatic disease. Electronically Signed   By: Jeannine Boga M.D.   On: 06/05/2022 02:50   CT ABDOMEN PELVIS WO CONTRAST  Result Date: 06/05/2022 CLINICAL DATA:  Intracranial metastatic disease of unknown primary. EXAM: CT ABDOMEN AND PELVIS WITHOUT CONTRAST TECHNIQUE: Multidetector CT imaging of the abdomen and pelvis was performed following the standard protocol without IV contrast. RADIATION DOSE REDUCTION: This exam was performed according to the departmental dose-optimization program which includes automated exposure control, adjustment of the mA and/or kV according to patient size and/or use of iterative reconstruction technique. COMPARISON:  CT chest 06/03/2022, CTA chest abdomen pelvis 05/03/2012 FINDINGS: Lower chest: Large right and small left pleural effusions are again identified. Compressive atelectasis of the lung bases bilaterally. Extensive right coronary artery calcification. Aortic valvular calcifications noted. Global cardiac size within normal limits. Hepatobiliary: No focal liver abnormality is seen. No gallstones, gallbladder wall thickening, or biliary dilatation. Pancreas: Unremarkable  Spleen: Unremarkable Adrenals/Urinary Tract: The adrenal glands are unremarkable. The right kidney is unremarkable on this noncontrast examination. 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 There is retained contrast within this portion of the collecting system related to prior contrast enhanced CT examination. Upper polar collecting system is decompressed. Point of obstruction likely at the upper and lower polar collecting system confluence at the left ureteropelvic junction. The left ureter is decompressed. No intrarenal or ureteral calculi. The bladder is unremarkable. Stomach/Bowel: Severe descending and sigmoid colonic diverticulosis without superimposed acute inflammatory change. The stomach, small bowel, and large bowel are otherwise unremarkable. Appendix normal. No free intraperitoneal gas or fluid. Vascular/Lymphatic: Aortic atherosclerosis. 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 Patency is not well assessed on this noncontrast examination. No enlarged abdominal or pelvic lymph nodes. Reproductive: Uterus and bilateral adnexa are unremarkable. Other: No abdominal wall hernia or abnormality. No abdominopelvic ascites. Musculoskeletal: Osseous structures are diffusely osteopenic. Degenerative changes are seen within the lumbar spine. Remote superior endplate fracture of L3 noted with mild loss of height and no retropulsion. No acute bone abnormality. No lytic or blastic bone lesions are identified. IMPRESSION: 1. No source identified for the patient's reported intracranial metastatic disease. PET CT examination may be more helpful for further evaluation. 2. Partially duplicated left renal collecting  system with marked hydronephrosis involving the lower pole calices with associated moderate to severe cortical atrophy suggesting longstanding obstruction. Point of obstruction likely at the upper and lower polar collecting system confluence at the left ureteropelvic junction. 3. Large right and small left pleural effusions. 4. Extensive right coronary artery calcification. 5. Severe distal colonic diverticulosis without superimposed acute inflammatory change. 6. Aortic atherosclerosis. Aortic Atherosclerosis (ICD10-I70.0). Electronically Signed   By: Fidela Salisbury M.D.   On: 06/05/2022 01:58  US Venous Img Lower Bilateral  Result Date: 06/03/2022 CLINICAL DATA:  Calf pain EXAM: Bilateral LOWER EXTREMITY VENOUS DOPPLER ULTRASOUND TECHNIQUE: Gray-scale sonography with compression, as well as color and duplex ultrasound, were performed to evaluate the deep venous system(s) from the level of the common femoral vein through the popliteal and proximal calf veins. COMPARISON:  None Available. FINDINGS: VENOUS Normal compressibility of the common femoral, superficial femoral, and popliteal veins, as well as the visualized calf veins. Visualized portions of profunda femoral vein and great saphenous vein unremarkable. No filling defects to suggest DVT on grayscale or color Doppler imaging. Doppler waveforms show normal direction of venous flow, normal respiratory plasticity and response to augmentation. OTHER None. Limitations: none IMPRESSION: Negative. Electronically Signed   By: Donavan Foil M.D.   On: 06/03/2022 19:51   CT Angio Chest PE W and/or Wo Contrast  Result Date: 06/03/2022 CLINICAL DATA:  Chest pain and shortness of breath. EXAM: CT ANGIOGRAPHY CHEST WITH CONTRAST TECHNIQUE: Multidetector CT imaging of the chest was performed using the standard protocol during bolus administration of intravenous contrast. Multiplanar CT image reconstructions and MIPs were obtained to evaluate the vascular anatomy.  RADIATION DOSE REDUCTION: This exam was performed according to the departmental dose-optimization program which includes automated exposure control, adjustment of the mA and/or kV according to patient size and/or use of iterative reconstruction technique. CONTRAST:  56m OMNIPAQUE IOHEXOL 350 MG/ML SOLN COMPARISON:  Chest x-ray, same date. FINDINGS: Cardiovascular: The heart is normal in size. No pericardial effusion. The ascending thoracic aorta is upper limits of size. No dissection. Scattered atherosclerotic calcifications. 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 The pulmonary arterial tree is well opacified. No filling defects to suggest pulmonary embolism. Mediastinum/Nodes: Mediastinal and hilar lymphadenopathy. Left paratracheal node measures 10 mm on image 26/4. Precarinal node on image 46/4 measures 13 mm. Subcarinal adenopathy measures 18 mm on image 55/4. There is also pre-vascular and AP window adenopathy and bilateral hilar adenopathy. The esophagus is grossly normal. Lungs/Pleura: Large right pleural effusion with significant overlying atelectasis and infiltrate. No discrete pulmonary mass is identified. Marked soft tissue thickening around the right hilum and right infrahilar region. Small left effusion and overlying atelectasis. Upper Abdomen: Enlarged upper abdominal lymph nodes including a 12 mm celiac axis node and retroperitoneal nodes. No hepatic or adrenal gland lesions. Advanced aortic calcifications. 14 mm calcified splenic artery aneurysm. Musculoskeletal: Asymmetric density involving the right breast. Could not exclude a mass. There is also enlarged right axillary lymph nodes which are worrisome. The bony thorax is intact. Review of the MIP images confirms the above findings. IMPRESSION: 1. No CT findings for pulmonary embolism. 2. Large right pleural effusion with significant overlying atelectasis and infiltrate.  Marked soft tissue thickening around the right hilum and right infrahilar region. 3. Small left effusion and overlying atelectasis. 4. Mediastinal, hilar and upper abdominal lymphadenopathy worrisome for metastatic disease. 5. Asymmetric density involving the right breast and possible 5 x 3 cm mass. Recommend correlation with breast exam and mammogram and ultrasound as indicated. Right axillary adenopathy is worrisome. 6. Aortic atherosclerosis. Aortic Atherosclerosis (ICD10-I70.0). Electronically Signed   By: PMarijo SanesM.D.   On: 06/03/2022 16:23   DG Chest 2 View  Result Date: 06/03/2022 CLINICAL DATA:  Shortness of breath EXAM: CHEST - 2 VIEW COMPARISON:  01/02/2008 FINDINGS: Status post median sternotomy. Normal cardiac and mediastinal contours given AP technique. Moderate right and small left pleural effusions with associated atelectasis. No pneumothorax.  No acute osseous abnormality. IMPRESSION: Moderate right and small left pleural effusions with associated atelectasis. Electronically Signed   By: Merilyn Baba M.D.   On: 06/03/2022 13:45    ASSESSMENT & PLAN:   Very sweet 88 year old lady with a history of coronary disease status post CABG, hypertension, dyslipidemia, type B aortic dissection with  #1 Likely right breast metastatic breast cancer . Noted to have 5 x 3 cm right breast mass with right axillary lymphadenopathy, large right pleural effusion-likely malignant, extensive hilar and mediastinal adenopathy and upper abdominal lymphadenopathy.  #2 Large right pleural effusion likely malignant and related to breast cancer.  Patient has also had cardiac history with coronary disease status post CABG. Status post ultrasound-guided diagnostic and therapeutic thoracentesis -results pending Plan -I discussed in detail with the patient and her 2 daughters at bedside all available lab results and imaging study results. -We discussed that the presentation and findings are concerning for  metastatic breast cancer. -Patient does endorse having the breast lump for 2 to 3 years and her shortness of breath has been increasing over the last 6 months but more recently worse over the last 2 to 3 weeks. -She has had a therapeutic and diagnostic thoracentesis on the right side which she will be looking out for the results especially the cytology to evaluate if this is metastatic breast cancer. -Patient will need an outside mammogram. -If possible as inpatient we could try to get an ultrasound-guided biopsy of her axillary lymph node on the right for tissue diagnosis and having adequate sample for testing for ER/PR and HER2 status. -Hopefully if this is breast cancer that it is hormone positive slow-growing breast cancer that could be treated with an aromatase inhibitor plus or minus a CDK 4/6 inhibitor at low doses to try to palliate her symptoms and control her disease. -She will likely need pulmonary consultation or outpatient follow-up to help with management of her malignant pleural effusion including consideration of pigtail catheter placement if her pleural effusion is rapidly reaccumulating and if she is having limiting respiratory symptoms. -Pain management of chest wall pain due to malignant infiltration per hospital medicine at this time. -She is getting an TTE to evaluate for cardiac function especially in the setting of possible infarcts. -I did order a CA 15 3 CA 27 29 for indirect evidence of metastatic breast cancer and to track response with treatment. -Initiated goals of care discussion with the patient and her 2 daughters.  #3 incidentally noted evidence of subacute ischemic infarcts in the brain on MRI. Being evaluated by neurology. Patient only symptoms are slight lower extremity weakness and some difficulty swallowing. -She is getting an MRA of the brain, TTE -PT OT and speech consultations to determine discharge needs.  The total time spent in the appointment was 80  minutes*.  All of the patient's questions were answered with apparent satisfaction. The patient knows to call the clinic with any problems, questions or concerns.   Sullivan Lone MD MS AAHIVMS [facility] Hematology/Oncology Physician [facility]  .*Total Encounter Time as defined by the Centers for Medicare and Medicaid Services includes, in addition to the face-to-face time of a patient visit (documented in the note above) non-face-to-face time: obtaining and reviewing outside history, ordering and reviewing medications, tests or procedures, care coordination (communications with other health care professionals or caregivers) and documentation in the medical record.   06/05/2022 10:43 AM

## 2022-06-06 ENCOUNTER — Inpatient Hospital Stay (HOSPITAL_COMMUNITY): Payer: Medicare Other

## 2022-06-06 ENCOUNTER — Other Ambulatory Visit (HOSPITAL_COMMUNITY): Payer: Medicare Other

## 2022-06-06 ENCOUNTER — Other Ambulatory Visit: Payer: Self-pay | Admitting: Physician Assistant

## 2022-06-06 DIAGNOSIS — I63313 Cerebral infarction due to thrombosis of bilateral middle cerebral arteries: Secondary | ICD-10-CM

## 2022-06-06 DIAGNOSIS — J90 Pleural effusion, not elsewhere classified: Secondary | ICD-10-CM | POA: Diagnosis not present

## 2022-06-06 DIAGNOSIS — C50911 Malignant neoplasm of unspecified site of right female breast: Secondary | ICD-10-CM

## 2022-06-06 DIAGNOSIS — I6389 Other cerebral infarction: Secondary | ICD-10-CM

## 2022-06-06 DIAGNOSIS — R000 Tachycardia, unspecified: Secondary | ICD-10-CM

## 2022-06-06 DIAGNOSIS — I6523 Occlusion and stenosis of bilateral carotid arteries: Secondary | ICD-10-CM

## 2022-06-06 DIAGNOSIS — N63 Unspecified lump in unspecified breast: Secondary | ICD-10-CM | POA: Diagnosis not present

## 2022-06-06 DIAGNOSIS — R9431 Abnormal electrocardiogram [ECG] [EKG]: Secondary | ICD-10-CM

## 2022-06-06 DIAGNOSIS — N6310 Unspecified lump in the right breast, unspecified quadrant: Secondary | ICD-10-CM

## 2022-06-06 DIAGNOSIS — J9601 Acute respiratory failure with hypoxia: Secondary | ICD-10-CM | POA: Diagnosis not present

## 2022-06-06 DIAGNOSIS — I639 Cerebral infarction, unspecified: Secondary | ICD-10-CM

## 2022-06-06 DIAGNOSIS — J910 Malignant pleural effusion: Secondary | ICD-10-CM

## 2022-06-06 LAB — ECHOCARDIOGRAM COMPLETE
AR max vel: 0.95 cm2
AV Area VTI: 1.04 cm2
AV Area mean vel: 0.99 cm2
AV Mean grad: 18.7 mmHg
AV Peak grad: 32.3 mmHg
Ao pk vel: 2.84 m/s
Area-P 1/2: 3.85 cm2
Height: 62 in
MV VTI: 2.73 cm2
S' Lateral: 2.2 cm
Weight: 2162.27 oz

## 2022-06-06 LAB — LIPID PANEL
Cholesterol: 279 mg/dL — ABNORMAL HIGH (ref 0–200)
HDL: 42 mg/dL (ref >40)
LDL Cholesterol: 217 mg/dL — ABNORMAL HIGH (ref 0–99)
Total CHOL/HDL Ratio: 6.6 RATIO
Triglycerides: 101 mg/dL (ref <150)
VLDL: 20 mg/dL (ref 0–40)

## 2022-06-06 MED ORDER — GADOBUTROL 1 MMOL/ML IV SOLN
6.0000 mL | Freq: Once | INTRAVENOUS | Status: AC | PRN
  Administered 2022-06-06: 6 mL via INTRAVENOUS

## 2022-06-06 MED ORDER — PRAVASTATIN SODIUM 40 MG PO TABS
80.0000 mg | ORAL_TABLET | Freq: Every day | ORAL | Status: DC
  Administered 2022-06-06: 80 mg via ORAL
  Filled 2022-06-06: qty 2

## 2022-06-06 MED ORDER — CLOPIDOGREL BISULFATE 75 MG PO TABS
75.0000 mg | ORAL_TABLET | Freq: Every day | ORAL | Status: DC
  Administered 2022-06-06 – 2022-06-07 (×2): 75 mg via ORAL
  Filled 2022-06-06 (×2): qty 1

## 2022-06-06 MED ORDER — PRAVASTATIN SODIUM 40 MG PO TABS: 40.0000 mg | ORAL_TABLET | Freq: Every day | ORAL | Status: DC

## 2022-06-06 NOTE — Progress Notes (Signed)
[facility]  PROGRESS NOTE  [name] QBH:[id number] DOB: Jun 10, 1928 DOA: 06/03/2022 PCP: Lucretia Kern, DO   Brief HPI:   88 year old female with medical history of CAD s/p CABG, hyperlipidemia, aortic dissection was brought to ED by family for 2 to 3 weeks of fatigue, shortness of breath, congestion.  In the ED CT scan was negative for PE, showed right large pleural effusion with significant overlying atelectasis and infiltrate.  Asymmetric density involving right breast and possible 5 x 3 cm mass, mediastinal hilar and upper abdominal lymphadenopathy worrisome for metastatic disease.    Subjective   Patient seen examined, breathing improved after thoracentesis yesterday.   Assessment/Plan:   Right pleural effusion -Unclear etiology; concern for malignancy as patient also found to have right breast mass -Thoracentesis performed: 650 cc fluid removed.  Cytology is pending -Patient was empirically started on ceftriaxone and Zithromax -Respiratory culture negative, strep pneumo urinary antigen negative -Procalcitonin was less than 0.10 -Patient will go home on 2 L of oxygen via nasal cannula   Elevated D-dimer -Bilateral venous duplex of lower extremities negative for DVT -CTA chest negative for PE  Right breast mass -Seen on CT scan; 5 x 3 cm mass -Along with mediastinal, hilar and upper abdominal lymphadenopathy; worrisome for metastatic disease -Thoracentesis to be performed today, will send pleural fluid for cytology -Discussed with oncology Dr. Irene Limbo, saw patient -CT abdomen/pelvis was negative for metastasis.  MRI brain without showed stroke -Will order MRI brain with contrast to rule out metastasis  Stroke -MRI brain obtained yesterday showed patchy restricted diffusion involving the left lentiform and caudate nuclei, consistent with acute to 30 subacute ischemic infarct. -Additional small focus of mild diffusion abnormality involving the contralateral high  posterior right frontal centrum semiovale likely small focus of evolving subacute ischemia -Few scattered remote lacunar infarcts involving thalami and pons -Neurology was consulted, -2D echo and carotid Dopplers ordered -Carotid Dopplers was negative for carotid artery stenosis -Will also order event monitor at discharge, called and discussed with [facility] cardiology; they will contact and mail event monitor the patient as outpatient  Hydronephrosis -CT abdomen/pelvis obtained for workup for metastatic disease -Did not show metastasis however showed hydronephrosis involving the left kidney  Acute hypoxemic respiratory failure -Likely from pleural effusion as above -Improved after thoracentesis -She will require oxygen at home  CAD s/p CABG -No chest pain -Continue statin, nitro as needed  Mild hyperkalemia -Resolved  Medications     aspirin  81 mg Oral Daily   heparin  5,000 Units Subcutaneous Q8H   lidocaine (PF)  5 mL Intradermal Once     Data Reviewed:   CBG:  No results for input(s): "GLUCAP" in the last 168 hours.  SpO2: 94 % O2 Flow Rate (L/min): 1 L/min FiO2 (%): 32 %    Vitals:   06/05/22 2035 06/06/22 0442 06/06/22 1116 06/06/22 1210  BP: 116/78 117/69  127/67  Pulse: 95 83  93  Resp: 18   18  Temp: 98.6 F (37 C) 97.9 F (36.6 C)  97.8 F (36.6 C)  TempSrc: Oral Oral  Oral  SpO2: 95% 95% 95% 94%  Weight:      Height:          Data Reviewed:  Basic Metabolic Panel: Recent Labs  Lab 06/03/22 1310 06/05/22 1047  NA 139 140  K 5.2* 3.9  CL 106 110  CO2 23 20*  GLUCOSE 106* 148*  BUN 18 10  CREATININE 0.65 0.73  CALCIUM 9.6  8.8*    CBC: Recent Labs  Lab 06/03/22 1310 06/05/22 1047  WBC 7.5 5.9  NEUTROABS  --  3.9  HGB 14.9 14.6  HCT 45.0 44.6  MCV 89.5 91.6  PLT 232 216    LFT Recent Labs  Lab 06/05/22 1047  AST 32  ALT 24  ALKPHOS 99  BILITOT 1.0  PROT 6.5  ALBUMIN 3.1*     Antibiotics: Anti-infectives (From  admission, onward)    Start     Dose/Rate Route Frequency Ordered Stop   06/04/22 1930  cefTRIAXone (ROCEPHIN) 1 g in sodium chloride 0.9 % 100 mL IVPB        1 g 200 mL/hr over 30 Minutes Intravenous Every 24 hours 06/04/22 1840     06/04/22 1930  azithromycin (ZITHROMAX) 500 mg in sodium chloride 0.9 % 250 mL IVPB        500 mg 250 mL/hr over 60 Minutes Intravenous Every 24 hours 06/04/22 1840     06/03/22 1700  cefTRIAXone (ROCEPHIN) 1 g in sodium chloride 0.9 % 100 mL IVPB        1 g 200 mL/hr over 30 Minutes Intravenous  Once 06/03/22 1654 06/03/22 1830   06/03/22 1700  azithromycin (ZITHROMAX) 500 mg in sodium chloride 0.9 % 250 mL IVPB        500 mg 250 mL/hr over 60 Minutes Intravenous  Once 06/03/22 1654 06/03/22 1902        DVT prophylaxis: Heparin  Code Status: Full code  Family Communication: Discussed with patient's daughters at bedside   CONSULTS    Objective    Physical Examination:  General-appears in no acute distress Heart-S1-S2, regular, no murmur auscultated Lungs-clear to auscultation bilaterally, no wheezing or crackles auscultated Abdomen-soft, nontender, no organomegaly Extremities-no edema in the lower extremities Neuro-alert, oriented x3, no focal deficit noted   Status is: Inpatient: Pleural effusion       [place]   [facility] If 7PM-7AM, please contact night-coverage at [email or web address], Office  [phone]   06/06/2022, 2:38 PM  LOS: 3 days

## 2022-06-06 NOTE — Progress Notes (Signed)
Patient ID: MINH JASPER, female   DOB: 11/25/1927, 88 y.o.   MRN: [id number]  Pulse oximetry on oxygen at rest is 95%  Pulse oximetry on oxygen while ambulating is 92%.   Pulse oximetry on room air at rest is 90%  Pulse oximetry on room air while ambulating is 82%   Haydee Salter, RN

## 2022-06-06 NOTE — Progress Notes (Signed)
*  PRELIMINARY RESULTS* Echocardiogram 2D Echocardiogram has been performed.  [name] 06/06/2022, 1:47 PM

## 2022-06-06 NOTE — Evaluation (Signed)
Physical Therapy Evaluation Patient Details Name: [name] MRN: [id number] DOB: March 17, 1928 Today's Date: 06/06/2022  History of Present Illness  Pt is a 88 y/o female admitted 06/03/22 with SOB x2-3 days, imaging revealing R pleural effusion. MRI revealing patchy restricted diffusion to L lentiform and caudate nuclei consistent with acute/subacute ischemic infarct. PMH includes CAD s/p CABG, HLD, Type B aortic dissection, carotid atherosclerosis.   Clinical Impression  Pt presents with an overall decrease in functional mobility secondary to above. PTA, pt mod indep ambulating with SPC/RW, lives with daughter who assists with iADLs as needed. Today, pt moving well with RW and intermittent min guard for balance; DOE 2-3/4 with ambulation, though SpO2 >/95% on RA with activity. Educ re: pulmonary hygiene, activity recommendations, importance of mobility. Pt would benefit from continued acute PT services to maximize functional mobility and independence prior to d/c with HHPT services.     SATURATION QUALIFICATIONS:  Patient Saturations on Room Air at Rest = 96% Patient Saturations on [employer] while Ambulating = 95% Patient Saturations on -- Liters of oxygen while Ambulating = N/A   *Regarding supplemental O2, daughter's main concern is that patient needs it overnight, not necessarily with mobility   Recommendations for follow up therapy are one component of a multi-disciplinary discharge planning process, led by the attending physician.  Recommendations may be updated based on patient status, additional functional criteria and insurance authorization.  Follow Up Recommendations Home health PT      Assistance Recommended at Discharge Intermittent Supervision/Assistance  Patient can return home with the following  A little help with bathing/dressing/bathroom;Assistance with cooking/housework;Direct supervision/assist for medications management;Direct supervision/assist for financial  management;Assist for transportation;Help with stairs or ramp for entrance    Equipment Recommendations None recommended by PT  Recommendations for Other Services       Functional Status Assessment Patient has had a recent decline in their functional status and demonstrates the ability to make significant improvements in function in a reasonable and predictable amount of time.     Precautions / Restrictions Precautions Precautions: Fall;Other (comment) Precaution Comments: h/o "double vision" (no c/o this session) Restrictions Weight Bearing Restrictions: No      Mobility  Bed Mobility Overal bed mobility: Needs Assistance Bed Mobility: Supine to Sit, Sit to Supine     Supine to sit: Min assist, HOB elevated Sit to supine: Supervision   General bed mobility comments: minA for HHA to elevate trunk; return to supine without assist    Transfers Overall transfer level: Needs assistance Equipment used: Rolling walker (2 wheels) Transfers: Sit to/from Stand Sit to Stand: Min guard           General transfer comment: multiple sit<>stands from EOB, bench and low toilet height (with use of grab bar) to RW with min guard    Ambulation/Gait Ambulation/Gait assistance: Min guard Gait Distance (Feet): 78 Feet (+ 90' + 20') Assistive device: Rolling walker (2 wheels) Gait Pattern/deviations: Step-through pattern, Decreased stride length, Trunk flexed Gait velocity: Decreased     General Gait Details: slow, steady gait with RW and intermittent min guard for balance; pt c/o bilateral fatigue, taking seated rest break in hallway before walking back to room; seated break to void in bathroom. DOE 3/4 noted, SpO2 95% on RA  Stairs            Wheelchair Mobility    Modified Rankin (Stroke Patients Only)       Balance Overall balance assessment: Needs assistance Sitting-balance support: No upper extremity  supported Sitting balance-Leahy Scale: Good Sitting balance -  Comments: indep with pericare sitting on toilet   Standing balance support: Reliant on assistive device for balance, During functional activity, No upper extremity supported Standing balance-Leahy Scale: Fair Standing balance comment: standing at sink without UE support to wash hands; preference for RW                             Pertinent Vitals/Pain Pain Assessment Pain Assessment: No/denies pain    Home Living Family/patient expects to be discharged to:: Private residence Living Arrangements: Children Available Help at Discharge: Family;Available 24 hours/day Type of Home: House Home Access: Stairs to enter   [employer] of Steps: 1   Home Layout: One level Home Equipment: Conservation officer, nature (2 wheels);Cane - single point;Shower seat      Prior Function Prior Level of Function : Independent/Modified Independent             Mobility Comments: Typically mod indep with SPC, though using RW more recently. values her independence ADLs Comments: shared IADLs between daughter; daughters provide transportation     Hand Dominance        Extremity/Trunk Assessment   Upper Extremity Assessment Upper Extremity Assessment: Generalized weakness    Lower Extremity Assessment Lower Extremity Assessment: Generalized weakness    Cervical / Trunk Assessment Cervical / Trunk Assessment: Kyphotic  Communication   Communication: HOH  Cognition Arousal/Alertness: Awake/alert Behavior During Therapy: WFL for tasks assessed/performed Overall Cognitive Status: Within Functional Limits for tasks assessed                                 General Comments: WFL for simple tasks, not formally assessed. noted some short-term memory deficits, repetitive in conversation, suspect baseline        General Comments General comments (skin integrity, edema, etc.): pt's children present and supportive. SpO2 >/95% on RA; daughter reports primary concern is pt  "hyperventilating at night." educ re: activity recommendations, pulmonary hygiene, importance of more frequent mobility    Exercises Other Exercises Other Exercises: incentive spirometer x8 (pt pulling <500 mL with difficulty performing correct technique despite cues/education)   Assessment/Plan    PT Assessment Patient needs continued PT services  PT Problem List Decreased strength;Decreased balance;Decreased activity tolerance;Decreased mobility;Cardiopulmonary status limiting activity       PT Treatment Interventions DME instruction;Gait training;Stair training;Functional mobility training;Therapeutic activities;Therapeutic exercise;Balance training;Patient/family education    PT Goals (Current goals can be found in the Care Plan section)  Acute Rehab PT Goals Patient Stated Goal: home today PT Goal Formulation: With patient/family Time For Goal Achievement: 06/20/22 Potential to Achieve Goals: Good    Frequency Min 3X/week     Co-evaluation               AM-PAC PT "6 Clicks" Mobility  Outcome Measure Help needed turning from your back to your side while in a flat bed without using bedrails?: A Little Help needed moving from lying on your back to sitting on the side of a flat bed without using bedrails?: A Little Help needed moving to and from a bed to a chair (including a wheelchair)?: A Little Help needed standing up from a chair using your arms (e.g., wheelchair or bedside chair)?: A Little Help needed to walk in hospital room?: A Little Help needed climbing 3-5 steps with a railing? : A Little 6 Click  Score: 18    End of Session Equipment Utilized During Treatment: Gait belt Activity Tolerance: Patient tolerated treatment well Patient left: in bed;with call bell/phone within reach;with family/visitor present Nurse Communication: Mobility status PT Visit Diagnosis: Other abnormalities of gait and mobility (R26.89);Muscle weakness (generalized) (M62.81)     Time: 6168-3729 PT Time Calculation (min) (ACUTE ONLY): 23 min   Charges:   PT Evaluation $PT Eval Moderate Complexity: [place], PT, DPT Acute Rehabilitation Services  Personal: [facility] Office: [place] 06/06/2022, 2:24 PM

## 2022-06-06 NOTE — Progress Notes (Addendum)
STROKE TEAM PROGRESS NOTE   INTERVAL HISTORY Her daughters is at the bedside and are asking about the cancer work up for the breast mass found, which I defer to the primary team. Pt has some shortness of breath when speaking, but has no actual complaint except that she wants to dc home. Pt has no complaints and reports no stroke symptoms nor any history of stroke. On exam, she has some fine motor incoordination on right only. She has had some dysphagia from what it sounds like, but this was attributed to her SOB. Stroke wk up underway.   Vitals:   06/05/22 2035 06/06/22 0442 06/06/22 1116 06/06/22 1210  BP: 116/78 117/69  127/67  Pulse: 95 83  93  Resp: 18   18  Temp: 98.6 F (37 C) 97.9 F (36.6 C)  97.8 F (36.6 C)  TempSrc: Oral Oral  Oral  SpO2: 95% 95% 95% 94%  Weight:      Height:       CBC:  Recent Labs  Lab 06/03/22 1310 06/05/22 1047  WBC 7.5 5.9  NEUTROABS  --  3.9  HGB 14.9 14.6  HCT 45.0 44.6  MCV 89.5 91.6  PLT 232 440   Basic Metabolic Panel:  Recent Labs  Lab 06/03/22 1310 06/05/22 1047  NA 139 140  K 5.2* 3.9  CL 106 110  CO2 23 20*  GLUCOSE 106* 148*  BUN 18 10  CREATININE 0.65 0.73  CALCIUM 9.6 8.8*   Lipid Panel:  Recent Labs  Lab 06/06/22 0438  CHOL 279*  TRIG 101  HDL 42  CHOLHDL 6.6  VLDL 20  LDLCALC 217*   HgbA1c: pending IMAGING past 24 hours VAS US CAROTID  Result Date: 06/06/2022 Carotid Arterial Duplex Study Patient Name:  [name]  Date of Exam:   06/06/2022 Medical Rec #: [id number]      Accession #:    [id number] Date of Birth: December 10, 1927      Patient Gender: F Patient Age:   88 years Exam Location:  [facility] Procedure:      VAS US CAROTID Referring Phys: Alferd P[facility]e [facility] --------------------------------------------------------------------------------  Indications:       CVA. Risk Factors:      Hyperlipidemia, prior MI, coronary artery disease. Comparison Study:  No recent priors. Performing Technologist: Darlin Coco RDMS, RVT  Examination Guidelines: A complete evaluation includes B-mode imaging, spectral Doppler, color Doppler, and power Doppler as needed of all accessible portions of each vessel. Bilateral testing is considered an integral part of a complete examination. Limited examinations for reoccurring indications may be performed as noted.  Right Carotid Findings: +----------+--------+--------+--------+------------------+--------+           PSV cm/sEDV cm/sStenosisPlaque DescriptionComments +----------+--------+--------+--------+------------------+--------+ CCA Prox  101     21                                         +----------+--------+--------+--------+------------------+--------+ CCA Distal73      25                                         +----------+--------+--------+--------+------------------+--------+ ICA Prox  97      21                                         +----------+--------+--------+--------+------------------+--------+  ICA Mid   76      29                                         +----------+--------+--------+--------+------------------+--------+ ICA Distal77      31                                         +----------+--------+--------+--------+------------------+--------+ ECA       74      9                                          +----------+--------+--------+--------+------------------+--------+ +----------+--------+-------+----------------+-------------------+           PSV cm/sEDV cmsDescribe        Arm Pressure (mmHG) +----------+--------+-------+----------------+-------------------+ Subclavian101            Multiphasic, WNL                    +----------+--------+-------+----------------+-------------------+ +---------+--------+--+--------+-+---------+ VertebralPSV cm/s35EDV cm/s7Antegrade +---------+--------+--+--------+-+---------+  Left Carotid Findings:  +----------+--------+--------+--------+------------------+------------------+           PSV cm/sEDV cm/sStenosisPlaque DescriptionComments           +----------+--------+--------+--------+------------------+------------------+ CCA Prox  88      18                                                   +----------+--------+--------+--------+------------------+------------------+ CCA Distal72      15                                                   +----------+--------+--------+--------+------------------+------------------+ ICA Prox  65      20                                intimal thickening +----------+--------+--------+--------+------------------+------------------+ ICA Mid   66      26                                tortuous           +----------+--------+--------+--------+------------------+------------------+ ICA Distal64      18                                                   +----------+--------+--------+--------+------------------+------------------+ ECA       69      11                                                   +----------+--------+--------+--------+------------------+------------------+ +----------+--------+--------+----------------+-------------------+  PSV cm/sEDV cm/sDescribe        Arm Pressure (mmHG) +----------+--------+--------+----------------+-------------------+ Subclavian132             Multiphasic, WNL                    +----------+--------+--------+----------------+-------------------+ +---------+--------+--+--------+--+---------+ VertebralPSV cm/s52EDV cm/s11Antegrade +---------+--------+--+--------+--+---------+   Summary: Right Carotid: The extracranial vessels were near-normal with only minimal wall                thickening or plaque. Left Carotid: The extracranial vessels were near-normal with only minimal wall               thickening or plaque. Vertebrals:  Bilateral vertebral arteries demonstrate  antegrade flow. Subclavians: Normal flow hemodynamics were seen in bilateral subclavian              arteries. *See table(s) above for measurements and observations.     Preliminary    MR ANGIO HEAD WO CONTRAST  Result Date: 06/06/2022 CLINICAL DATA:  Neuro deficit with acute stroke suspected EXAM: MRA HEAD WITHOUT CONTRAST TECHNIQUE: Angiographic images of the Circle of Willis were acquired using MRA technique without intravenous contrast. COMPARISON:  Brain MRI from yesterday FINDINGS: Anterior circulation: Atheromatous irregularity of intracranial branches with mild to moderate left M1 segment narrowing. No branch occlusion, beading, or aneurysm. Posterior circulation: Diffuse atheromatous irregularity of the basilar and vertebral arteries with high-grade narrowing at the distal left vertebral artery which is non dominant. No branch occlusion, segmental beading, or aneurysm. Anatomic variants: Hypoplastic P1 segments and left A1 segment. IMPRESSION: 1. No emergent finding. 2. Generalized intracranial atherosclerosis most notably causing high-grade stenosis at the non dominant distal left vertebral artery and mild-to-moderate narrowing of the left M1 segment. Electronically Signed   By: Jorje Guild M.D.   On: 06/06/2022 04:50    PHYSICAL EXAM General: Appears well-developed, no acute distress. Psych: Affect appropriate to situation Eyes: No scleral injection HENT: No OP obstrucion Head: Normocephalic.  Cardiovascular: Normal rate and regular rhythm. Respiratory: Effort normal and breath sounds normal to anterior ascultation GI: Soft.  No distension. There is no tenderness.  Skin: WDI    Neurological Examination Mental Status: Alert, oriented, thought content appropriate.  Speech fluent without evidence of aphasia. Able to follow 3 step commands without difficulty. Cranial Nerves: II: Visual fields grossly normal,  III,IV, VI: ptosis not present, extra-ocular motions intact bilaterally,  pupils equal, round, reactive to light and accommodation V,VII: smile symmetric, facial light touch sensation normal bilaterally VIII: hearing normal bilaterally IX,X: uvula rises symmetrically XI: bilateral shoulder shrug XII: midline tongue extension Motor: RUE:4/5, no drift, but noted with slow finger tapping on right hand. Rest of limbs are all 4/5 with diffuse gen weakness, no focal deficits. Tone and bulk:normal tone throughout; no atrophy noted Sensory: light touch intact throughout, bilaterally; no extinction to bilat double stimuli. Deep Tendon Reflexes: 2+ and symmetric throughout Plantars: Right: downgoing   Left: downgoing Cerebellar: normal finger-to-nose, normal rapid alternating movements and normal heel-to-shin test Gait: normal gait and station NIHSS 1a Level of Conscious.:  1b LOC Questions:  1c LOC Commands:  2 Best Gaze:  3 Visual:  4 Facial Palsy:  5a Motor Arm - Left: 5b Motor Arm - Right:  6a Motor Leg - Left:  6b Motor Leg - Right:  7 Limb Ataxia:  8 Sensory:  9 Best Language:  10 Dysarthria:  11 Extinct. and Inatten.:  TOTAL: 0  ASSESSMENT/PLAN Ms. BRAYLON LEMMONS is a 88 y.o.  female with history of CAD and CABG, hyperlipidemia, aortic dissection and carotid atherosclerosis who originally presented to the ER on 12/7 with shortness of breath and generalized weakness/fatigue. She was found to have bilateral pleural effusions as well as right breast mass along with mediastinal hilar and upper abdominal lymphadenopathy. She is undergoing workup for metastatic disease and has had a thoracentesis for analysis of her pleural fluid. MRI brain was done to r/o metastatic disease (although not done with Bobbie Stack- will defer to [place] for this), but incidentally shows subacute strokes in the left lentiform and caudate nuclei as well as in the right centrum semiovale. Patient denies any unilateral weakness, numbness or difficulties in speech, but states she does have a family  history of stroke. She has no history of atrial fibrillation.   Stroke: Subacute infarcts in the left BG and CR as well as the right SO, etiology unclear possibly synchronized infarct from small vessel disease given uncontrolled risk factors vs. hypercoagulable state in setting of advanced malignancy MRA head: generalized intracranial athero with high-grade stenosis at distal L Vert and mild-mod narrowing of LM1 CUS unremarkable MRI  Subacute strokes in the left lentiform and caudate nuclei as well as the right central centrum semiovale  2D Echo EF 65-70% OK with 30 day cardiac event monitoring as outpt LDL 217 HgbA1c pending VTE prophylaxis - SCDs, heparin sq Not on antithrombotics prior to admission, now on aspirin 81 mg daily and plavix DAPT for 3 weeks and then ASA alone.  Therapy recommendations:  HH Disposition:  pending  Hypertension Home meds:  none Stable Long-term BP goal normotensive  Hyperlipidemia Home meds:  none LDL 217, goal < 70 Put on pravastatin 80, pt hx of intolerance of zocor and lipitor May consider PCSK9 inhibitor as out pt to further bring down LDL  Continue statin at discharge  Other Stroke Risk Factors Advanced Age >/= 18  Family hx stroke (mom and dad) CAD s/p CABG and Angioplasty (on Plavix at home) S/p Aortic dissection repair  Breast mass CT showed Asymmetric density involving the right breast and possible 5 x 3 cm mass. Mediastinal, hilar and upper abdominal lymphadenopathy worrisome for metastatic disease. MRI brain with contrast pending Oncology on board Plan for outpt mammogram and biopsy  Other [facility] day # 3   Rosalin Hawking, MD PhD Stroke Neurology 06/06/2022 7:27 PM    To contact Stroke Continuity provider, please refer to [email or web address] After hours, contact General Neurology

## 2022-06-06 NOTE — Progress Notes (Signed)
Carotid duplex bilateral study completed.   Please see CV Proc for preliminary results.   Janson Lamar, RDMS, RVT  

## 2022-06-06 NOTE — Evaluation (Addendum)
Occupational Therapy Evaluation Patient Details Name: [name] MRN: [id number] DOB: 1928/04/16 Today's Date: 06/06/2022   History of Present Illness Pt is a 88 y/o F presenting ot ED on 12/7 with SOB x2-3 days, imaging revealing R pleural effusion. MRI revealing patchy restricted diffusion to L lentiform and caudate nuclei consistent with acute/subacute ischemic infarct. PMH includes CAD s/p CABG, HLD, Type B aortic dissection, carotid atherosclerosis.   Clinical Impression   Pt reports independence at baseline with ADLs, uses cane/RW for mobility as of recently. Pt lives with daughters who can assist at d/c, pt currently needing min-mod A for ADLs, min guard A for bed mobility, and min guard for transfers with RW. 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 Pt presenting with impairments listed below, will follow acutely. Recommend HHOT at d/c.     Recommendations for follow up therapy are one component of a multi-disciplinary discharge planning process, led by the attending physician.  Recommendations may be updated based on patient status, additional functional criteria and insurance authorization.   Follow Up Recommendations  Home health OT     Assistance Recommended at Discharge Intermittent Supervision/Assistance  Patient can return home with the following A little help with walking and/or transfers;A lot of help with bathing/dressing/bathroom;Assistance with cooking/housework;Direct supervision/assist for medications management;Direct supervision/assist for financial management;Assist for transportation;Help with stairs or ramp for entrance    Functional Status Assessment  Patient has had a recent decline in their functional status and demonstrates the ability to make significant improvements in function in a reasonable and predictable amount of time.  Equipment  Recommendations  None recommended by OT    Recommendations for Other Services PT consult     Precautions / Restrictions Precautions Precautions: Fall Precaution Comments: h/o "double vision" (no c/o this session) Restrictions Weight Bearing Restrictions: No      Mobility Bed Mobility Overal bed mobility: Needs Assistance Bed Mobility: Supine to Sit, Sit to Supine     Supine to sit: Min guard Sit to supine: Min guard        Transfers Overall transfer level: Needs assistance Equipment used: Rolling walker (2 wheels) Transfers: Sit to/from Stand Sit to Stand: Min guard                  Balance Overall balance assessment: Needs assistance Sitting-balance support: Feet supported Sitting balance-Leahy Scale: Good     Standing balance support: Reliant on assistive device for balance, During functional activity Standing balance-Leahy Scale: Fair Standing balance comment: stands statically without LOB                           ADL either performed or assessed with clinical judgement   ADL Overall ADL's : Needs assistance/impaired Eating/Feeding: Set up;Sitting   Grooming: Set up;Sitting   Upper Body Bathing: Minimal assistance   Lower Body Bathing: Moderate assistance   Upper Body Dressing : Minimal assistance   Lower Body Dressing: Moderate assistance   Toilet Transfer: Min guard;Rolling walker (2 wheels);Ambulation;Regular Toilet   Toileting- Water quality scientist and Hygiene: Min guard       Functional mobility during ADLs: Min guard;Rolling walker (2 wheels)       Vision   Additional Comments: 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, typically wears a patch and or closes one eye to compensate     Perception  Perception Perception Tested?: No   Praxis Praxis Praxis tested?: Not tested    Pertinent Vitals/Pain Pain Assessment Pain Assessment: No/denies pain     Hand Dominance     Extremity/Trunk  Assessment Upper Extremity Assessment Upper Extremity Assessment: Generalized weakness   Lower Extremity Assessment Lower Extremity Assessment: Defer to PT evaluation   Cervical / Trunk Assessment Cervical / Trunk Assessment: Kyphotic   Communication Communication Communication: No difficulties   Cognition Arousal/Alertness: Awake/alert Behavior During Therapy: WFL for tasks assessed/performed Overall Cognitive Status: Within Functional Limits for tasks assessed                                       General Comments  VSS on 2L O2    Exercises     Shoulder Instructions      Home Living Family/patient expects to be discharged to:: Private residence Living Arrangements: Children Available Help at Discharge: Family;Available 24 hours/day Type of Home: House Home Access: Stairs to enter [employer] of Steps: 1   Home Layout: One level     Bathroom Shower/Tub: Tub/shower unit;Walk-in shower   Bathroom Toilet: Standard     Home Equipment: Conservation officer, nature (2 wheels);Cane - single point;Shower seat          Prior Functioning/Environment Prior Level of Function : Independent/Modified Independent             Mobility Comments: uses cane and RW ADLs Comments: shared IADLs between daughter and daugthers; daughters provide transportation        OT Problem List: Decreased strength;Decreased range of motion;Decreased activity tolerance;Impaired balance (sitting and/or standing);Decreased cognition;Decreased safety awareness;Cardiopulmonary status limiting activity      OT Treatment/Interventions: Self-care/ADL training;Therapeutic exercise;Energy conservation;DME and/or AE instruction;Patient/family education;Balance training;Cognitive remediation/compensation;Therapeutic activities    OT Goals(Current goals can be found in the care plan section) Acute Rehab OT Goals Patient Stated Goal: none stated OT Goal Formulation: With patient Time  For Goal Achievement: 06/20/22 Potential to Achieve Goals: Good  OT Frequency: Min 2X/week    Co-evaluation              AM-PAC OT "6 Clicks" Daily Activity     Outcome Measure Help from another person eating meals?: None Help from another person taking care of personal grooming?: A Little Help from another person toileting, which includes using toliet, bedpan, or urinal?: A Little Help from another person bathing (including washing, rinsing, drying)?: A Lot Help from another person to put on and taking off regular upper body clothing?: A Little Help from another person to put on and taking off regular lower body clothing?: A Lot 6 Click Score: 17   End of Session Equipment Utilized During Treatment: Gait belt;Rolling walker (2 wheels);Oxygen Nurse Communication: Mobility status  Activity Tolerance: Patient tolerated treatment well Patient left: in bed;with call bell/phone within reach;with bed alarm set;with family/visitor present  OT Visit Diagnosis: Unsteadiness on feet (R26.81);Other abnormalities of gait and mobility (R26.89);Muscle weakness (generalized) (M62.81)                Time: 9476-5465 OT Time Calculation (min): 29 min Charges:  OT General Charges $OT Visit: 1 Visit OT Evaluation $OT Eval Moderate Complexity: 1 Mod OT Treatments $Self Care/Home Management : 8-22 mins  [name], OTD, OTR/L [employer] Preferred Acute Rehab [phone]  [name] 06/06/2022, 4:55 PM

## 2022-06-07 ENCOUNTER — Encounter: Payer: Self-pay | Admitting: *Deleted

## 2022-06-07 ENCOUNTER — Other Ambulatory Visit: Payer: Self-pay | Admitting: Hematology

## 2022-06-07 DIAGNOSIS — J910 Malignant pleural effusion: Secondary | ICD-10-CM

## 2022-06-07 DIAGNOSIS — N6310 Unspecified lump in the right breast, unspecified quadrant: Secondary | ICD-10-CM

## 2022-06-07 LAB — TOXOPLASMA ANTIBODIES- IGG AND  IGM
Toxoplasma Antibody- IgM: 5 AU/mL (ref 0.0–7.9)
Toxoplasma IgG Ratio: 41.2 IU/mL — ABNORMAL HIGH (ref 0.0–7.1)

## 2022-06-07 LAB — HEMOGLOBIN A1C
Hgb A1c MFr Bld: 6.4 % — ABNORMAL HIGH (ref 4.8–5.6)
Mean Plasma Glucose: 137 mg/dL

## 2022-06-07 LAB — TRIGLYCERIDES, BODY FLUIDS: Triglycerides, Fluid: 23 mg/dL

## 2022-06-07 MED ORDER — PRAVASTATIN SODIUM 80 MG PO TABS: 80.0000 mg | ORAL_TABLET | Freq: Every day | ORAL | 3 refills | Status: DC

## 2022-06-07 MED ORDER — CLOPIDOGREL BISULFATE 75 MG PO TABS: 75.0000 mg | ORAL_TABLET | Freq: Every day | ORAL | 0 refills | Status: AC

## 2022-06-07 MED ORDER — ASPIRIN 81 MG PO TBEC: 81.0000 mg | DELAYED_RELEASE_TABLET | Freq: Every day | ORAL | 2 refills | Status: AC

## 2022-06-07 NOTE — Progress Notes (Signed)
Carolynn Comment Venus to be D/C'd  per MD order.  Discussed with the patient and daughters all questions fully answered.  VSS, Skin clean, dry and intact without evidence of skin break down, no evidence of skin tears noted.  IV catheter discontinued intact. Site without signs and symptoms of complications. Dressing and pressure applied.  An After Visit Summary was printed and given to the patient.   D/c re-educated completed with patient/family including follow up instructions, medication list, d/c activities limitations if indicated, with other d/c instructions as indicated by MD - patient able to verbalize understanding, all questions fully answered.   Patient instructed to return to ED, call 911, or call MD for any changes in condition.   Patient to be escorted via [place], and D/C home via private auto.

## 2022-06-07 NOTE — Progress Notes (Signed)
Patient enrolled for [employer] to ship a 30 day cardiac event monitor to her address on file. Letter with instructions mailed to patient.  Dr. Sallyanne Kuster to read.

## 2022-06-07 NOTE — Progress Notes (Signed)
Occupational Therapy Treatment Patient Details Name: [name] MRN: [id number] DOB: [birth year] Today's Date: 06/07/2022   History of present illness Pt is a 88 y/o F presenting ot ED on 12/7 with SOB x2-3 days, imaging revealing R pleural effusion. MRI revealing patchy restricted diffusion to L lentiform and caudate nuclei consistent with acute/subacute ischemic infarct. PMH includes CAD s/p CABG, HLD, Type B aortic dissection, carotid atherosclerosis.   OT comments  Patient up in recliner upon entry and asking when she can go home. Patient required cues for safety with sit to stands and mobility with min guard to supervision for most ADL tasks. Patient on RA during visit with SpO2 at 95 and HR at 98 following mobility. Patient would benefit from follow up OT in [facility] setting to further address ADLs and IADL. Acute OT to continue to follow while in this setting.    Recommendations for follow up therapy are one component of a multi-disciplinary discharge planning process, led by the attending physician.  Recommendations may be updated based on patient status, additional functional criteria and insurance authorization.    Follow Up Recommendations  Home health OT     Assistance Recommended at Discharge Intermittent Supervision/Assistance  Patient can return home with the following  A little help with walking and/or transfers;A lot of help with bathing/dressing/bathroom;Assistance with cooking/housework;Direct supervision/assist for medications management;Direct supervision/assist for financial management;Assist for transportation;Help with stairs or ramp for entrance   Equipment Recommendations  None recommended by OT    Recommendations for Other Services      Precautions / Restrictions Precautions Precautions: Fall Restrictions Weight Bearing Restrictions: No       Mobility Bed Mobility Overal bed mobility: Needs Assistance             General bed mobility comments: OOB in  recliner    Transfers Overall transfer level: Needs assistance Equipment used: Rolling walker (2 wheels) Transfers: Sit to/from Stand Sit to Stand: Min guard           General transfer comment: cues for hand placement     Balance Overall balance assessment: Needs assistance Sitting-balance support: Feet supported Sitting balance-Leahy Scale: Good     Standing balance support: Single extremity supported, Bilateral upper extremity supported, No upper extremity supported, During functional activity Standing balance-Leahy Scale: Fair Standing balance comment: able to stand at sink for grooming tasks with no UE support                           ADL either performed or assessed with clinical judgement   ADL Overall ADL's : Needs assistance/impaired     Grooming: Wash/dry hands;Wash/dry face;Min guard;Standing Grooming Details (indicate cue type and reason): at sink             Lower Body Dressing: Supervision/safety;Sitting/lateral leans Lower Body Dressing Details (indicate cue type and reason): changed socks Toilet Transfer: Min guard;Rolling walker (2 wheels);Ambulation;Regular Glass blower/designer Details (indicate cue type and reason): transfer training with cues for hand placement           General ADL Comments: supervision to min guard for self care tasks    Extremity/Trunk Assessment              Vision       Perception     Praxis      Cognition Arousal/Alertness: Awake/alert Behavior During Therapy: WFL for tasks assessed/performed Overall Cognitive Status: Within Functional Limits for tasks assessed  General Comments: eager to return home        Exercises      Shoulder Instructions       General Comments on RA SpO295 and HR 98 following standing    Pertinent Vitals/ Pain       Pain Assessment Pain Assessment: No/denies pain  Home Living                                           Prior Functioning/Environment              Frequency  Min 2X/week        Progress Toward Goals  OT Goals(current goals can now be found in the care plan section)  Progress towards OT goals: Progressing toward goals  Acute Rehab OT Goals Patient Stated Goal: go home today OT Goal Formulation: With patient Time For Goal Achievement: 06/20/22 Potential to Achieve Goals: Good ADL Goals Pt Will Perform Upper Body Dressing: with supervision;sitting Pt Will Perform Lower Body Dressing: with supervision;sitting/lateral leans;sit to/from stand Pt Will Transfer to Toilet: with supervision;ambulating;regular height toilet Pt Will Perform Tub/Shower Transfer: with supervision;ambulating;shower seat;Shower transfer  Plan Discharge plan remains appropriate    Co-evaluation                 AM-PAC OT "6 Clicks" Daily Activity     Outcome Measure   Help from another person eating meals?: None Help from another person taking care of personal grooming?: A Little Help from another person toileting, which includes using toliet, bedpan, or urinal?: A Little Help from another person bathing (including washing, rinsing, drying)?: A Little Help from another person to put on and taking off regular upper body clothing?: A Little Help from another person to put on and taking off regular lower body clothing?: A Little 6 Click Score: 19    End of Session Equipment Utilized During Treatment: Gait belt;Rolling walker (2 wheels)  OT Visit Diagnosis: Unsteadiness on feet (R26.81);Other abnormalities of gait and mobility (R26.89);Muscle weakness (generalized) (M62.81)   Activity Tolerance Patient tolerated treatment well   Patient Left in chair;with call bell/phone within reach;with family/visitor present   Nurse Communication Mobility status        Time: 1610-9604 OT Time Calculation (min): 23 min  Charges: OT General Charges $OT Visit: 1 Visit OT  Treatments $Self Care/Home Management : 8-22 mins $Therapeutic Activity: 8-22 mins  Lodema Hong, [place]  Office [phone]   Trixie Dredge 06/07/2022, 1:32 PM

## 2022-06-07 NOTE — Progress Notes (Signed)
STROKE TEAM PROGRESS NOTE   INTERVAL HISTORY Her daughters is at the bedside. Pt sitting in chair and stated that she felt good today and she is eager to go home today. MRI brain with contrast had concerns of brain mets.    Vitals:   06/06/22 1608 06/06/22 2028 06/07/22 0514 06/07/22 0814  BP: 113/60 113/72 118/70 121/74  Pulse: 92 88 88 80  Resp: _0 Temp: 98 F (36.7 C) 98.3 F (36.8 C) 98 F (36.7 C) (!) 97.5 F (36.4 C)  TempSrc: Oral Oral Oral Oral  SpO2: 94% 91% (!) 89% 91%  Weight:      Height:       CBC:  Recent Labs  Lab 06/03/22 1310 06/05/22 1047  WBC 7.5 5.9  NEUTROABS  --  3.9  HGB 14.9 14.6  HCT 45.0 44.6  MCV 89.5 91.6  PLT 232 710   Basic Metabolic Panel:  Recent Labs  Lab 06/03/22 1310 06/05/22 1047  NA 139 140  K 5.2* 3.9  CL 106 110  CO2 23 20*  GLUCOSE 106* 148*  BUN 18 10  CREATININE 0.65 0.73  CALCIUM 9.6 8.8*   Lipid Panel:  Recent Labs  Lab 06/06/22 0438  CHOL 279*  TRIG 101  HDL 42  CHOLHDL 6.6  VLDL 20  LDLCALC 217*   HgbA1c: pending IMAGING past 24 hours ECHOCARDIOGRAM COMPLETE  Result Date: 06/06/2022    ECHOCARDIOGRAM REPORT   Patient Name:   [name] Date of Exam: 06/06/2022 Medical Rec #:  [id number]     Height:       62.0 in Accession #:    [id number]    Weight:       135.1 lb Date of Birth:  1928/01/01     BSA:          1.618 m Patient Age:    88 years      BP:           117/69 mmHg Patient Gender: F             HR:           91 bpm. Exam Location:  Inpatient Procedure: 2D Echo, Cardiac Doppler and Color Doppler Indications:    Stroke  History:        Patient has no prior history of Echocardiogram examinations.                 CAD, Stroke; Risk Factors:Dyslipidemia.  Sonographer:    Wenda Low Referring Phys: [id number] [place]  1. Left ventricular ejection fraction, by estimation, is 65 to 70%. The left ventricle has normal function. The left ventricle has no regional wall motion  abnormalities. There is mild concentric left ventricular hypertrophy. Diastolic function indeterminant due to severe MAC.  2. Right ventricular systolic function is normal. The right ventricular size is normal. There is normal pulmonary artery systolic pressure. The estimated right ventricular systolic pressure is 16.9 mmHg.  3. The mitral valve is degenerative. Mild mitral valve regurgitation. No evidence of mitral stenosis. Severe mitral annular calcification. There is severe casseous MAC noted on the posterior mitral valve annulus.  4. The aortic valve is tricuspid. There is severe calcifcation of the aortic valve. There is severe thickening of the aortic valve. Aortic valve regurgitation is trivial. Mild to moderate aortic valve stenosis. Aortic valve mean gradient measures 18.7 mmHg. Aortic valve Vmax measures 2.84 m/s.  5. The inferior vena cava  is normal in size with greater than 50% respiratory variability, suggesting right atrial pressure of 3 mmHg.  6. There is mild-to-moderate highly eccentric TR best appreciated on clip 30. Conclusion(s)/Recommendation(s): No intracardiac source of embolism detected on this transthoracic study. Consider a transesophageal echocardiogram to exclude cardiac source of embolism if clinically indicated. FINDINGS  Left Ventricle: Left ventricular ejection fraction, by estimation, is 65 to 70%. The left ventricle has normal function. The left ventricle has no regional wall motion abnormalities. The left ventricular internal cavity size was normal in size. There is  mild concentric left ventricular hypertrophy. Diastolic function indeterminant due to severe MAC. Right Ventricle: The right ventricular size is normal. No increase in right ventricular wall thickness. Right ventricular systolic function is normal. There is normal pulmonary artery systolic pressure. The tricuspid regurgitant velocity is 2.40 m/s, and  with an assumed right atrial pressure of 3 mmHg, the estimated  right ventricular systolic pressure is 27.0 mmHg. Left Atrium: Left atrial size was normal in size. Right Atrium: Right atrial size was normal in size. Pericardium: There is no evidence of pericardial effusion. Mitral Valve: There is severe, casseous MAC present on the posterior mitral valve annulus. The mitral valve is degenerative in appearance. There is mild thickening of the mitral valve leaflet(s). There is mild calcification of the mitral valve leaflet(s). Severe mitral annular calcification. Mild mitral valve regurgitation. No evidence of mitral valve stenosis. MV peak gradient, 3.5 mmHg. The mean mitral valve gradient is 1.0 mmHg. Tricuspid Valve: The tricuspid valve is normal in structure. Tricuspid valve regurgitation mild-to-moderate highly eccentric TR. Aortic Valve: AVA 1.2, DI 0.39. The aortic valve is tricuspid. There is severe calcifcation of the aortic valve. There is severe thickening of the aortic valve. Aortic valve regurgitation is trivial. Mild to moderate aortic stenosis is present. Aortic valve mean gradient measures 18.7 mmHg. Aortic valve peak gradient measures 32.3 mmHg. Aortic valve area, by VTI measures 1.04 cm. Pulmonic Valve: The pulmonic valve was normal in structure. Pulmonic valve regurgitation is trivial. Aorta: The aortic root is normal in size and structure. Venous: The inferior vena cava is normal in size with greater than 50% respiratory variability, suggesting right atrial pressure of 3 mmHg. IAS/Shunts: The atrial septum is grossly normal.  LEFT VENTRICLE PLAX 2D LVIDd:         3.90 cm   Diastology LVIDs:         2.20 cm   LV e' medial:    4.24 cm/s LV PW:         1.10 cm   LV E/e' medial:  15.9 LV IVS:        1.20 cm   LV e' lateral:   5.98 cm/s LVOT diam:     1.90 cm   LV E/e' lateral: 11.3 LV SV:         55 LV SV Index:   34 LVOT Area:     2.84 cm  RIGHT VENTRICLE RV Basal diam:  3.15 cm RV Mid diam:    2.90 cm RV S prime:     11.70 cm/s TAPSE (M-mode): 1.7 cm LEFT  ATRIUM           Index        RIGHT ATRIUM           Index LA diam:      3.10 cm 1.92 cm/m   RA Area:     14.10 cm LA Vol (A2C): 34.0 ml 21.01 ml/m  RA Volume:  39.50 ml  24.41 ml/m LA Vol (A4C): 34.5 ml 21.32 ml/m  AORTIC VALVE                     PULMONIC VALVE AV Area (Vmax):    0.95 cm      PV Vmax:       1.72 m/s AV Area (Vmean):   0.99 cm      PV Peak grad:  11.8 mmHg AV Area (VTI):     1.04 cm AV Vmax:           284.00 cm/s AV Vmean:          178.000 cm/s AV VTI:            0.531 m AV Peak Grad:      32.3 mmHg AV Mean Grad:      18.7 mmHg LVOT Vmax:         95.30 cm/s LVOT Vmean:        62.000 cm/s LVOT VTI:          0.195 m LVOT/AV VTI ratio: 0.37  AORTA Ao Root diam: 3.00 cm Ao Asc diam:  3.50 cm MITRAL VALVE               TRICUSPID VALVE MV Area (PHT): 3.85 cm    TR Peak grad:   23.0 mmHg MV Area VTI:   2.73 cm    TR Vmax:        240.00 cm/s MV Peak grad:  3.5 mmHg MV Mean grad:  1.0 mmHg    SHUNTS MV Vmax:       0.94 m/s    Systemic VTI:  0.20 m MV Vmean:      53.0 cm/s   Systemic Diam: 1.90 cm MV Decel Time: 197 msec MV E velocity: 67.30 cm/s MV A velocity: 90.40 cm/s MV E/A ratio:  0.74 Gwyndolyn Kaufman MD Electronically signed by Gwyndolyn Kaufman MD Signature Date/Time: 06/06/2022/4:32:12 PM    Final    MR BRAIN W CONTRAST  Result Date: 06/06/2022 CLINICAL DATA:  Assess for intracranial metastases EXAM: MRI HEAD WITH CONTRAST TECHNIQUE: Multiplanar, multiecho pulse sequences of the brain and surrounding structures were obtained with intravenous contrast. CONTRAST:  13m GADAVIST GADOBUTROL 1 MMOL/ML IV SOLN COMPARISON:  MRI Brain 06/05/22, MRA Head 06/06/22 FINDINGS: Brain: Please see prior brain MRI from 06/06/2023 description of acute to subacute infarcts involving the left basal ganglia and the right centrum semiovale. There is no evidence of contrast enhancement in the regions of infarction. There is a 3 mm contrast-enhancing lesion in the left frontal lobe (series 6, image 24)  possible additional adjacent 4 mm contrast-enhancing lesion (series 6, image 23). There is a additional dural-based contrast-enhancing lesion on the left frontal convexity (series 6, image 30). Vascular: Please see same-day MRA for vascular findings. Skull and upper cervical spine: Normal marrow signal. Sinuses/Orbits: Negative. Other: None IMPRESSION: 1. Two small contrast-enhancing lesions in the left frontal lobe are concerning for metastatic disease. 2. Additional dural-based contrast-enhancing lesion on the left frontal convexity, could represent a small meningioma or a dural based metastatic lesion. 3. Please see prior brain MRI from 06/06/2023 description of acute to subacute infarcts involving the left basal ganglia and the right centrum semiovale. No evidence of contrast enhancement in the regions of infarction. Electronically Signed   By: HMarin RobertsM.D.   On: 06/06/2022 15:34    PHYSICAL EXAM General: Appears well-developed, no acute distress. Psych: Affect appropriate to situation  Eyes: No scleral injection HENT: No OP obstrucion Head: Normocephalic.  Cardiovascular: Normal rate and regular rhythm. Respiratory: Effort normal and breath sounds normal to anterior ascultation GI: Soft.  No distension. There is no tenderness.  Skin: WDI    Neurological Examination Mental Status: Alert, oriented, thought content appropriate.  Speech fluent without evidence of aphasia. Able to follow 3 step commands without difficulty. Cranial Nerves: II: Visual fields grossly normal,  III,IV, VI: ptosis not present, extra-ocular motions intact bilaterally, pupils equal, round, reactive to light and accommodation V,VII: smile symmetric, facial light touch sensation normal bilaterally VIII: hearing normal bilaterally IX,X: uvula rises symmetrically XI: bilateral shoulder shrug XII: midline tongue extension Motor: RUE:4/5, no drift, but noted with slow finger tapping on right hand. Rest of limbs are  all 4/5 with diffuse gen weakness, no focal deficits. Tone and bulk:normal tone throughout; no atrophy noted Sensory: light touch intact throughout, bilaterally; no extinction to bilat double stimuli. Deep Tendon Reflexes: 2+ and symmetric throughout Plantars: Right: downgoing   Left: downgoing Cerebellar: normal finger-to-nose, normal rapid alternating movements and normal heel-to-shin test Gait: normal gait and station NIHSS 1a Level of Conscious.:  1b LOC Questions:  1c LOC Commands:  2 Best Gaze:  3 Visual:  4 Facial Palsy:  5a Motor Arm - Left: 5b Motor Arm - Right:  6a Motor Leg - Left:  6b Motor Leg - Right:  7 Limb Ataxia:  8 Sensory:  9 Best Language:  10 Dysarthria:  11 Extinct. and Inatten.:  TOTAL: 0  ASSESSMENT/PLAN [name] is a 88 y.o. female with history of CAD and CABG, hyperlipidemia, aortic dissection and carotid atherosclerosis who originally presented to the ER on 12/7 with shortness of breath and generalized weakness/fatigue. She was found to have bilateral pleural effusions as well as right breast mass along with mediastinal hilar and upper abdominal lymphadenopathy. She is undergoing workup for metastatic disease and has had a thoracentesis for analysis of her pleural fluid. MRI brain was done to r/o metastatic disease (although not done with Bobbie Stack- will defer to [place] for this), but incidentally shows subacute strokes in the left lentiform and caudate nuclei as well as in the right centrum semiovale. Patient denies any unilateral weakness, numbness or difficulties in speech, but states she does have a family history of stroke. She has no history of atrial fibrillation.   Stroke: Subacute infarcts in the left BG and CR as well as the right SO, etiology unclear possibly synchronized infarct from small vessel disease given uncontrolled risk factors vs. hypercoagulable state in setting of advanced malignancy MRA head: generalized intracranial athero with  high-grade stenosis at distal L Vert and mild-mod narrowing of LM1 CUS unremarkable MRI  Subacute strokes in the left lentiform and caudate nuclei as well as the right central centrum semiovale  2D Echo EF 65-70% OK with 30 day cardiac event monitoring as outpt LDL 217 HgbA1c pending VTE prophylaxis - SCDs, heparin sq Not on antithrombotics prior to admission, now on aspirin 81 mg daily and plavix DAPT for 3 weeks and then ASA alone.  Therapy recommendations:  HH Disposition:  home today  Hypertension Home meds:  none Stable Long-term BP goal normotensive  Hyperlipidemia Home meds:  none LDL 217, goal < 70 Put on pravastatin 80, pt hx of intolerance of zocor and lipitor May consider PCSK9 inhibitor as out pt to further bring down LDL  Continue statin at discharge  Other Stroke Risk Factors Advanced Age >/= 45  Family  hx stroke (mom and dad) CAD s/p CABG and Angioplasty (on Plavix at home) S/p Aortic dissection repair  Breast mass CT showed Asymmetric density involving the right breast and possible 5 x 3 cm mass. Mediastinal, hilar and upper abdominal lymphadenopathy worrisome for metastatic disease. MRI brain with contrast concerning for small foci of brain mets Oncology on board Plan for outpt mammogram and biopsy and follow up with Dr. Marcene Brawn  Other Active Problems   Hospital day # 4  Neurology will sign off. Please call with questions. Pt will follow up with stroke clinic NP at [facility] in about 4 weeks. Thanks for the consult.   Rosalin Hawking, MD PhD Stroke Neurology 06/07/2022 1:00 PM    To contact Stroke Continuity provider, please refer to [email or web address] After hours, contact General Neurology

## 2022-06-07 NOTE — TOC Initial Note (Signed)
Transition of Care (TOC) - Initial/Assessment Note  Spoke to patient at bedside with daughter Eustaquio Maize .   Patient from home with [facility].   Discussed HHPT/OT both in agreement. Referral given to and accepted by Claiborne Billings with [facility].   Patient has a walker at home.    Order for home oxygen. Per PT note daughters wanting her to have home oxygen at night. Discussed with patient and Beth , she passed her ambulation oxygen saturation note, would need over night oximetry study . Patient does not want home oxygen and daughter states she was concerned , but how the fluid is off she does not feel her mother needs oxygen.   Secure chatted nurse and MD   Patient Details  Name: [name] MRN: [id number] Date of Birth: December 20, 1927  [facility] CM/SW Contact:    Marilu Favre, RN Phone Number: 06/07/2022, 12:29 PM  Clinical Narrative:                   Expected Discharge Plan: [place] Barriers to Discharge: Continued Medical Work up   Patient Goals and CMS Choice Patient states their goals for this hospitalization and ongoing recovery are:: to return to home CMS Medicare.gov Compare Post Acute Care list provided to:: Patient Choice offered to / list presented to : Patient  Expected Discharge Plan and Services Expected Discharge Plan: [place]   Discharge Planning Services: CM Consult Post Acute Care Choice: [place] arrangements for the past 2 months: Single Family Home                 DME Arranged: N/A         HH Arranged: PT, OT HH Agency: [place] Date [place]: 06/07/22 Time [place]: 1228 Representative spoke with at [place]: Claiborne Billings  Prior Living Arrangements/Services Living arrangements for the past 2 months: [place] with:: Adult Children Patient language and need for interpreter reviewed:: Yes Do you feel safe going back to the place where you live?:  Yes      Need for Family Participation in Patient Care: Yes (Comment) Care giver support system in place?: Yes (comment) Current home services: DME Criminal Activity/Legal Involvement Pertinent to Current Situation/Hospitalization: No - Comment as needed  Activities of Daily Living Home Assistive Devices/Equipment: Blood pressure cuff, Eyeglasses, Walker (specify type), Cane (specify quad or straight) ADL Screening (condition at time of admission) Patient's cognitive ability adequate to safely complete daily activities?: Yes Is the patient deaf or have difficulty hearing?: No Does the patient have difficulty seeing, even when wearing glasses/contacts?: No Does the patient have difficulty concentrating, remembering, or making decisions?: No Patient able to express need for assistance with ADLs?: Yes Does the patient have difficulty dressing or bathing?: No Independently performs ADLs?: Yes (appropriate for developmental age) Does the patient have difficulty walking or climbing stairs?: No Weakness of Legs: Both Weakness of Arms/Hands: None  Permission Sought/Granted   Permission granted to share information with : Yes, Verbal Permission Granted  Share Information with NAME: daughter Eustaquio Maize  Permission granted to share info w AGENCY: [facility]        Emotional Assessment Appearance:: Appears stated age Attitude/Demeanor/Rapport: Engaged Affect (typically observed): Accepting Orientation: : Oriented to Self, Oriented to Place, Oriented to  Time, Oriented to Situation Alcohol / Substance Use: Not Applicable Psych Involvement: No (comment)  Admission diagnosis:  Pleural effusion on right [J90] Acute hypoxic  respiratory failure ([place]) [J96.01] Patient Active Problem List   Diagnosis Date Noted   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

## 2022-06-07 NOTE — Discharge Summary (Signed)
Physician Discharge Summary   Patient: [name] MRN: [id number] DOB: 13-May-1928  Admit date:     06/03/2022  Discharge date: 06/07/22  Discharge Physician: Oswald Hillock   PCP: Lucretia Kern, DO   Recommendations at discharge:    Follow-up oncology as outpatient Follow-up with [facility] in 4 weeks Follow-up results of pleural fluid cytology as outpatient  Discharge Diagnoses: Principal Problem:   Pleural effusion on right Active Problems:   Malignant pleural effusion   Mass of right breast  Resolved Problems:   * No resolved hospital problems. *  Hospital Course:  88 year old female with medical history of CAD s/p CABG, hyperlipidemia, aortic dissection was brought to ED by family for 2 to 3 weeks of fatigue, shortness of breath, congestion.  In the ED CT scan was negative for PE, showed right large pleural effusion with significant overlying atelectasis and infiltrate.  Asymmetric density involving right breast and possible 5 x 3 cm mass, mediastinal hilar and upper abdominal lymphadenopathy worrisome for metastatic disease.   Assessment and Plan:  Right pleural effusion -Unclear etiology; concern for malignancy as patient also found to have right breast mass -Thoracentesis performed: 650 cc fluid removed.  Cytology is pending -Patient was empirically started on ceftriaxone and Zithromax -Respiratory culture negative, strep pneumo urinary antigen negative -Procalcitonin was less than 0.10 -Breathing has improved, she is no longer requiring oxygen -On ambulation  O2 sats remained 96%     Elevated D-dimer -Bilateral venous duplex of lower extremities negative for DVT -CTA chest negative for PE   Right breast mass -Seen on CT scan; 5 x 3 cm mass -Along with mediastinal, hilar and upper abdominal lymphadenopathy; worrisome for metastatic disease -Thoracentesis to be performed today, will send pleural fluid for cytology -Discussed with oncology  Dr. Irene Limbo, saw patient -CT abdomen/pelvis was negative for metastasis.  MRI brain without showed stroke -MRI brain with contrast ordered showed 2 small spots in frontal lobe concerning for metastasis; discussed with oncology -Patient has an outpatient appointment with oncology for breast biopsy, mammogram and breast ultrasound at the breast center as outpatient   Stroke -MRI brain obtained yesterday showed patchy restricted diffusion involving the left lentiform and caudate nuclei, consistent with acute to 30 subacute ischemic infarct. -Additional small focus of mild diffusion abnormality involving the contralateral high posterior right frontal centrum semiovale likely small focus of evolving subacute ischemia -Few scattered remote lacunar infarcts involving thalami and pons -Neurology was consulted, -2D echo and carotid Dopplers ordered -Carotid Dopplers was negative for carotid artery stenosis -2D echo shows no acute abnormality, no source for thrombus identified -Will also order event monitor at discharge, called and discussed with [facility] cardiology; they will contact and mail event monitor the patient as outpatient -Patient will be discharged on aspirin and Plavix for 3 weeks and then stop Plavix and continue with aspirin 81 mg p.o. daily -Patient will be discharged on pravastatin 80 mg p.o. daily -Follow-up neurology as outpatient    Hydronephrosis -CT abdomen/pelvis obtained for workup for metastatic disease -Did not show metastasis however showed hydronephrosis involving the left kidney   Acute hypoxemic respiratory failure -Likely from pleural effusion as above -Resolved  after thoracentesis -Currently not requiring oxygen   CAD s/p CABG -No chest pain -Continue statin, nitro as needed   Mild hyperkalemia -Resolved         Consultants:" Oncology, neurology, IR Procedures performed: Thoracentesis Disposition: Home Diet recommendation:  Discharge Diet Orders (From  admission, onward)  Start     Ordered   06/07/22 0000  Diet - low sodium heart healthy        06/07/22 1315           Cardiac diet DISCHARGE MEDICATION: Allergies as of 06/07/2022       Reactions   Lipitor [atorvastatin]    Simvastatin         Medication List     TAKE these medications    aspirin EC 81 MG tablet Take 1 tablet (81 mg total) by mouth daily. Swallow whole.   clopidogrel 75 MG tablet Commonly known as: PLAVIX Take 1 tablet (75 mg total) by mouth daily for 21 days. Start taking on: June 08, 2022   nitroGLYCERIN 0.4 MG/SPRAY spray Commonly known as: NITROLINGUAL Place 1 spray under the tongue every 5 (five) minutes x 3 doses as needed for chest pain. If no relief after that then call 911.   pravastatin 80 MG tablet Commonly known as: PRAVACHOL Take 1 tablet (80 mg total) by mouth daily at 6 PM. What changed:  medication strength See the new instructions.        Follow-up Information     [facility] [place] Follow up.   Specialty: [facility] Contact information: [place] [address] [phone]         [facility]. Schedule an appointment as soon as possible for a visit in 1 month(s).   Specialty: Neurology Why: stroke clinic Contact information: [address] [place] [phone]        Brunetta Genera, MD. Schedule an appointment as soon as possible for a visit.   Specialties: Hematology, Oncology Contact information: [place] [address] [phone]                Discharge Exam: Danley Danker Weights   06/04/22 1410  Weight: 61.3 kg   General-appears in no acute distress Heart-S1-S2, regular, no murmur auscultated Lungs-clear to auscultation bilaterally, no wheezing or crackles auscultated Abdomen-soft, nontender, no organomegaly Extremities-no edema in the lower  extremities Neuro-alert, oriented x3, no focal deficit noted  Condition at discharge: good  The results of significant diagnostics from this hospitalization (including imaging, microbiology, ancillary and laboratory) are listed below for reference.   Imaging Studies: VAS US CAROTID  Result Date: 06/06/2022 Carotid Arterial Duplex Study Patient Name:  ARRAYAH CONNORS  Date of Exam:   06/06/2022 Medical Rec #: [id number]      Accession #:    [id number] Date of Birth: 20-Dec-1927      Patient Gender: F Patient Age:   65 years Exam Location:  [facility] Procedure:      VAS US CAROTID Referring Phys: Alferd Patee [facility] --------------------------------------------------------------------------------  Indications:       CVA. Risk Factors:      Hyperlipidemia, prior MI, coronary artery disease. Comparison Study:  No recent priors. Performing Technologist: Darlin Coco RDMS, RVT  Examination Guidelines: A complete evaluation includes B-mode imaging, spectral Doppler, color Doppler, and power Doppler as needed of all accessible portions of each vessel. Bilateral testing is considered an integral part of a complete examination. Limited examinations for reoccurring indications may be performed as noted.  Right Carotid Findings: +----------+--------+--------+--------+------------------+--------+           PSV cm/sEDV cm/sStenosisPlaque DescriptionComments +----------+--------+--------+--------+------------------+--------+ CCA Prox  101     21                                         +----------+--------+--------+--------+------------------+--------+  CCA Distal73      25                                         +----------+--------+--------+--------+------------------+--------+ ICA Prox  97      21                                         +----------+--------+--------+--------+------------------+--------+ ICA Mid   76      29                                          +----------+--------+--------+--------+------------------+--------+ ICA Distal77      31                                         +----------+--------+--------+--------+------------------+--------+ ECA       74      9                                          +----------+--------+--------+--------+------------------+--------+ +----------+--------+-------+----------------+-------------------+           PSV cm/sEDV cmsDescribe        Arm Pressure (mmHG) +----------+--------+-------+----------------+-------------------+ Subclavian101            Multiphasic, WNL                    +----------+--------+-------+----------------+-------------------+ +---------+--------+--+--------+-+---------+ VertebralPSV cm/s35EDV cm/s7Antegrade +---------+--------+--+--------+-+---------+  Left Carotid Findings: +----------+--------+--------+--------+------------------+------------------+           PSV cm/sEDV cm/sStenosisPlaque DescriptionComments           +----------+--------+--------+--------+------------------+------------------+ CCA Prox  88      18                                                   +----------+--------+--------+--------+------------------+------------------+ CCA Distal72      15                                                   +----------+--------+--------+--------+------------------+------------------+ ICA Prox  65      20                                intimal thickening +----------+--------+--------+--------+------------------+------------------+ ICA Mid   66      26                                tortuous           +----------+--------+--------+--------+------------------+------------------+ ICA Distal64      18                                                   +----------+--------+--------+--------+------------------+------------------+  ECA       69      11                                                    +----------+--------+--------+--------+------------------+------------------+ +----------+--------+--------+----------------+-------------------+           PSV cm/sEDV cm/sDescribe        Arm Pressure (mmHG) +----------+--------+--------+----------------+-------------------+ Subclavian132             Multiphasic, WNL                    +----------+--------+--------+----------------+-------------------+ +---------+--------+--+--------+--+---------+ VertebralPSV cm/s52EDV cm/s11Antegrade +---------+--------+--+--------+--+---------+   Summary: Right Carotid: The extracranial vessels were near-normal with only minimal wall                thickening or plaque. Left Carotid: The extracranial vessels were near-normal with only minimal wall               thickening or plaque. Vertebrals:  Bilateral vertebral arteries demonstrate antegrade flow. Subclavians: Normal flow hemodynamics were seen in bilateral subclavian              arteries. *See table(s) above for measurements and observations.  Electronically signed by Antony Contras MD on 06/06/2022 at 9:24:12 PM.    Final    ECHOCARDIOGRAM COMPLETE  Result Date: 06/06/2022    ECHOCARDIOGRAM REPORT   Patient Name:   Tashonna DIANEY SUCHY Date of Exam: 06/06/2022 Medical Rec #:  [id number]     Height:       62.0 in Accession #:    [id number]    Weight:       135.1 lb Date of Birth:  1928-06-28     BSA:          1.618 m Patient Age:    81 years      BP:           117/69 mmHg Patient Gender: F             HR:           91 bpm. Exam Location:  Inpatient Procedure: 2D Echo, Cardiac Doppler and Color Doppler Indications:    Stroke  History:        Patient has no prior history of Echocardiogram examinations.                 CAD, Stroke; Risk Factors:Dyslipidemia.  Sonographer:    Wenda Low Referring Phys: [id number] [place]  1. Left ventricular ejection fraction, by estimation, is 65 to 70%. The left ventricle has normal function. The  left ventricle has no regional wall motion abnormalities. There is mild concentric left ventricular hypertrophy. Diastolic function indeterminant due to severe MAC.  2. Right ventricular systolic function is normal. The right ventricular size is normal. There is normal pulmonary artery systolic pressure. The estimated right ventricular systolic pressure is 26.8 mmHg.  3. The mitral valve is degenerative. Mild mitral valve regurgitation. No evidence of mitral stenosis. Severe mitral annular calcification. There is severe casseous MAC noted on the posterior mitral valve annulus.  4. The aortic valve is tricuspid. There is severe calcifcation of the aortic valve. There is severe thickening of the aortic valve. Aortic valve regurgitation is trivial. Mild to moderate aortic valve stenosis. Aortic valve mean gradient measures 18.7 mmHg. Aortic valve Vmax  measures 2.84 m/s.  5. The inferior vena cava is normal in size with greater than 50% respiratory variability, suggesting right atrial pressure of 3 mmHg.  6. There is mild-to-moderate highly eccentric TR best appreciated on clip 30. Conclusion(s)/Recommendation(s): No intracardiac source of embolism detected on this transthoracic study. Consider a transesophageal echocardiogram to exclude cardiac source of embolism if clinically indicated. FINDINGS  Left Ventricle: Left ventricular ejection fraction, by estimation, is 65 to 70%. The left ventricle has normal function. The left ventricle has no regional wall motion abnormalities. The left ventricular internal cavity size was normal in size. There is  mild concentric left ventricular hypertrophy. Diastolic function indeterminant due to severe MAC. Right Ventricle: The right ventricular size is normal. No increase in right ventricular wall thickness. Right ventricular systolic function is normal. There is normal pulmonary artery systolic pressure. The tricuspid regurgitant velocity is 2.40 m/s, and  with an assumed right  atrial pressure of 3 mmHg, the estimated right ventricular systolic pressure is 86.7 mmHg. Left Atrium: Left atrial size was normal in size. Right Atrium: Right atrial size was normal in size. Pericardium: There is no evidence of pericardial effusion. Mitral Valve: There is severe, casseous MAC present on the posterior mitral valve annulus. The mitral valve is degenerative in appearance. There is mild thickening of the mitral valve leaflet(s). There is mild calcification of the mitral valve leaflet(s). Severe mitral annular calcification. Mild mitral valve regurgitation. No evidence of mitral valve stenosis. MV peak gradient, 3.5 mmHg. The mean mitral valve gradient is 1.0 mmHg. Tricuspid Valve: The tricuspid valve is normal in structure. Tricuspid valve regurgitation mild-to-moderate highly eccentric TR. Aortic Valve: AVA 1.2, DI 0.39. The aortic valve is tricuspid. There is severe calcifcation of the aortic valve. There is severe thickening of the aortic valve. Aortic valve regurgitation is trivial. Mild to moderate aortic stenosis is present. Aortic valve mean gradient measures 18.7 mmHg. Aortic valve peak gradient measures 32.3 mmHg. Aortic valve area, by VTI measures 1.04 cm. Pulmonic Valve: The pulmonic valve was normal in structure. Pulmonic valve regurgitation is trivial. Aorta: The aortic root is normal in size and structure. Venous: The inferior vena cava is normal in size with greater than 50% respiratory variability, suggesting right atrial pressure of 3 mmHg. IAS/Shunts: The atrial septum is grossly normal.  LEFT VENTRICLE PLAX 2D LVIDd:         3.90 cm   Diastology LVIDs:         2.20 cm   LV e' medial:    4.24 cm/s LV PW:         1.10 cm   LV E/e' medial:  15.9 LV IVS:        1.20 cm   LV e' lateral:   5.98 cm/s LVOT diam:     1.90 cm   LV E/e' lateral: 11.3 LV SV:         55 LV SV Index:   34 LVOT Area:     2.84 cm  RIGHT VENTRICLE RV Basal diam:  3.15 cm RV Mid diam:    2.90 cm RV S prime:      11.70 cm/s TAPSE (M-mode): 1.7 cm LEFT ATRIUM           Index        RIGHT ATRIUM           Index LA diam:      3.10 cm 1.92 cm/m   RA Area:     14.10 cm LA Vol (  A2C): 34.0 ml 21.01 ml/m  RA Volume:   39.50 ml  24.41 ml/m LA Vol (A4C): 34.5 ml 21.32 ml/m  AORTIC VALVE                     PULMONIC VALVE AV Area (Vmax):    0.95 cm      PV Vmax:       1.72 m/s AV Area (Vmean):   0.99 cm      PV Peak grad:  11.8 mmHg AV Area (VTI):     1.04 cm AV Vmax:           284.00 cm/s AV Vmean:          178.000 cm/s AV VTI:            0.531 m AV Peak Grad:      32.3 mmHg AV Mean Grad:      18.7 mmHg LVOT Vmax:         95.30 cm/s LVOT Vmean:        62.000 cm/s LVOT VTI:          0.195 m LVOT/AV VTI ratio: 0.37  AORTA Ao Root diam: 3.00 cm Ao Asc diam:  3.50 cm MITRAL VALVE               TRICUSPID VALVE MV Area (PHT): 3.85 cm    TR Peak grad:   23.0 mmHg MV Area VTI:   2.73 cm    TR Vmax:        240.00 cm/s MV Peak grad:  3.5 mmHg MV Mean grad:  1.0 mmHg    SHUNTS MV Vmax:       0.94 m/s    Systemic VTI:  0.20 m MV Vmean:      53.0 cm/s   Systemic Diam: 1.90 cm MV Decel Time: 197 msec MV E velocity: 67.30 cm/s MV A velocity: 90.40 cm/s MV E/A ratio:  0.74 Gwyndolyn Kaufman MD Electronically signed by Gwyndolyn Kaufman MD Signature Date/Time: 06/06/2022/4:32:12 PM    Final    MR BRAIN W CONTRAST  Result Date: 06/06/2022 CLINICAL DATA:  Assess for intracranial metastases EXAM: MRI HEAD WITH CONTRAST TECHNIQUE: Multiplanar, multiecho pulse sequences of the brain and surrounding structures were obtained with intravenous contrast. CONTRAST:  46m GADAVIST GADOBUTROL 1 MMOL/ML IV SOLN COMPARISON:  MRI Brain 06/05/22, MRA Head 06/06/22 FINDINGS: Brain: Please see prior brain MRI from 06/06/2023 description of acute to subacute infarcts involving the left basal ganglia and the right centrum semiovale. There is no evidence of contrast enhancement in the regions of infarction. There is a 3 mm contrast-enhancing lesion in the left  frontal lobe (series 6, image 24) possible additional adjacent 4 mm contrast-enhancing lesion (series 6, image 23). There is a additional dural-based contrast-enhancing lesion on the left frontal convexity (series 6, image 30). Vascular: Please see same-day MRA for vascular findings. Skull and upper cervical spine: Normal marrow signal. Sinuses/Orbits: Negative. Other: None IMPRESSION: 1. Two small contrast-enhancing lesions in the left frontal lobe are concerning for metastatic disease. 2. Additional dural-based contrast-enhancing lesion on the left frontal convexity, could represent a small meningioma or a dural based metastatic lesion. 3. Please see prior brain MRI from 06/06/2023 description of acute to subacute infarcts involving the left basal ganglia and the right centrum semiovale. No evidence of contrast enhancement in the regions of infarction. Electronically Signed   By: HMarin RobertsM.D.   On: 06/06/2022 15:34   MR ANGIO HEAD WO  CONTRAST  Result Date: 06/06/2022 CLINICAL DATA:  Neuro deficit with acute stroke suspected EXAM: MRA HEAD WITHOUT CONTRAST TECHNIQUE: Angiographic images of the Circle of Willis were acquired using MRA technique without intravenous contrast. COMPARISON:  Brain MRI from yesterday FINDINGS: Anterior circulation: Atheromatous irregularity of intracranial branches with mild to moderate left M1 segment narrowing. No branch occlusion, beading, or aneurysm. Posterior circulation: Diffuse atheromatous irregularity of the basilar and vertebral arteries with high-grade narrowing at the distal left vertebral artery which is non dominant. No branch occlusion, segmental beading, or aneurysm. Anatomic variants: Hypoplastic P1 segments and left A1 segment. IMPRESSION: 1. No emergent finding. 2. Generalized intracranial atherosclerosis most notably causing high-grade stenosis at the non dominant distal left vertebral artery and mild-to-moderate narrowing of the left M1 segment.  Electronically Signed   By: Jorje Guild M.D.   On: 06/06/2022 04:50   US THORACENTESIS ASP PLEURAL SPACE W/IMG GUIDE  Result Date: 06/05/2022 INDICATION: 88 year old female presents with shortness of breath, previous imaging showed right pleural effusion. Request for therapeutic and diagnostic thoracentesis. EXAM: ULTRASOUND GUIDED RIGHT THORACENTESIS MEDICATIONS: 5 mL 1% lidocaine COMPLICATIONS: None immediate. PROCEDURE: An ultrasound guided thoracentesis was thoroughly discussed with the patient and questions answered. The benefits, risks, alternatives and complications were also discussed. The patient understands and wishes to proceed with the procedure. Written consent was obtained. Ultrasound was performed to localize and mark an adequate pocket of fluid in the right chest. The area was then prepped and draped in the normal sterile fashion. 1% Lidocaine was used for local anesthesia. Under ultrasound guidance a 6 Fr Safe-T-Centesis catheter was introduced. Thoracentesis was performed. The catheter was removed and a dressing applied. FINDINGS: A total of approximately 650 mL of hazy yellow fluid was removed. Samples were sent to the laboratory as requested by the clinical team. Post procedure chest X-ray reviewed, negative for pneumothorax. IMPRESSION: Successful ultrasound guided right thoracentesis yielding 650 mL of pleural fluid. Read by: Durenda Guthrie, PA-C Electronically Signed   By: Ruthann Cancer M.D.   On: 06/05/2022 13:07   DG Chest 1 View  Result Date: 06/05/2022 CLINICAL DATA:  88 year old female status post ultrasound-guided right side thoracentesis this morning. EXAM: CHEST  1 VIEW COMPARISON:  06/03/2022 chest radiographs, CTA. FINDINGS: Upright AP view at 1148 hours. No pneumothorax. Reduced right side pleural effusion and improved right lung base ventilation. Stable cardiac size and mediastinal contours. Prior CABG. Lower lung volumes overall, increased left lung base opacity is probably  atelectasis superimposed on smaller pleural effusion seen by CTA. Stable visible abdomen, osseous structures. IMPRESSION: 1. Reduced right pleural effusion and improved right lung base ventilation following thoracentesis. No pneumothorax. 2. Lower lung volumes with increased left lung base opacity, probably atelectasis. Electronically Signed   By: Genevie Ann M.D.   On: 06/05/2022 12:09   MR BRAIN WO CONTRAST  Result Date: 06/05/2022 CLINICAL DATA:  Initial evaluation for brain metastases. EXAM: MRI HEAD WITHOUT CONTRAST TECHNIQUE: Multiplanar, multiecho pulse sequences of the brain and surrounding structures were obtained without intravenous contrast. COMPARISON:  None available. FINDINGS: Brain: Diffuse prominence of the CSF containing spaces compatible generalized cerebral atrophy. Patchy and confluent T2/FLAIR hyperintensity involving the periventricular deep white matter both cerebral hemispheres, consistent with chronic microvascular ischemic disease, advanced in nature. Few scattered superimposed remote lacunar infarcts present about the thalami and pons. Patchy restricted diffusion involving the left lentiform and caudate nuclei, consistent with acute to early subacute ischemic infarct (series 5, images 78, 82). No associated hemorrhage or mass  effect. Additional small focus of mild diffusion abnormality at the contralateral high posterior right frontal centrum semi ovale, likely a small focus of evolving subacute ischemia (series 5, image 90). Otherwise, no other acute or subacute ischemic changes are seen. Gray-white matter differentiation otherwise maintained. No acute or chronic intracranial blood products. No mass lesion, midline shift or mass effect. Diffuse ventricular prominence related to global parenchymal volume loss without hydrocephalus. No extra-axial fluid collection. Pituitary gland suprasellar region within normal limits. Vascular: Major intracranial vascular flow voids are maintained. Skull  and upper cervical spine: Craniocervical junctional limits. Bone marrow signal intensity normal. No focal marrow replacing lesion. No scalp soft tissue abnormality. Sinuses/Orbits: Patient status post bilateral ocular lens replacement. Mild scattered mucosal thickening noted about the ethmoidal air cells and maxillary sinuses. Trace right mastoid effusion noted, of doubtful significance. Other: None. IMPRESSION: 1. Patchy restricted diffusion involving the left lentiform and caudate nuclei, consistent with acute to early subacute ischemic infarct. No associated hemorrhage or mass effect. 2. Additional small focus of mild diffusion abnormality involving the contralateral high posterior right frontal centrum semi ovale, likely a small focus of evolving subacute ischemia. 3. Underlying age-related cerebral atrophy with advanced chronic microvascular ischemic disease, with a few scattered remote lacunar infarcts about the thalami and pons. 4. No intracranial mass or evidence for metastatic disease. Electronically Signed   By: Jeannine Boga M.D.   On: 06/05/2022 02:50   CT ABDOMEN PELVIS WO CONTRAST  Result Date: 06/05/2022 CLINICAL DATA:  Intracranial metastatic disease of unknown primary. EXAM: CT ABDOMEN AND PELVIS WITHOUT CONTRAST TECHNIQUE: Multidetector CT imaging of the abdomen and pelvis was performed following the standard protocol without IV contrast. RADIATION DOSE REDUCTION: This exam was performed according to the departmental dose-optimization program which includes automated exposure control, adjustment of the mA and/or kV according to patient size and/or use of iterative reconstruction technique. COMPARISON:  CT chest 06/03/2022, CTA chest abdomen pelvis 05/03/2012 FINDINGS: Lower chest: Large right and small left pleural effusions are again identified. Compressive atelectasis of the lung bases bilaterally. Extensive right coronary artery calcification. Aortic valvular calcifications noted.  Global cardiac size within normal limits. Hepatobiliary: No focal liver abnormality is seen. No gallstones, gallbladder wall thickening, or biliary dilatation. Pancreas: Unremarkable Spleen: Unremarkable Adrenals/Urinary Tract: The adrenal glands are unremarkable. The right kidney is unremarkable on this noncontrast examination. 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 There is retained contrast within this portion of the collecting system related to prior contrast enhanced CT examination. Upper polar collecting system is decompressed. Point of obstruction likely at the upper and lower polar collecting system confluence at the left ureteropelvic junction. The left ureter is decompressed. No intrarenal or ureteral calculi. The bladder is unremarkable. Stomach/Bowel: Severe descending and sigmoid colonic diverticulosis without superimposed acute inflammatory change. The stomach, small bowel, and large bowel are otherwise unremarkable. Appendix normal. No free intraperitoneal gas or fluid. Vascular/Lymphatic: Aortic atherosclerosis. 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 Patency is not well assessed on this noncontrast examination. No enlarged abdominal or pelvic lymph nodes. Reproductive: Uterus and bilateral adnexa are unremarkable. Other: No abdominal wall hernia or abnormality. No abdominopelvic ascites. Musculoskeletal: Osseous structures are diffusely osteopenic. Degenerative changes are seen within the lumbar spine. Remote superior endplate fracture of L3 noted  with mild loss of height and no retropulsion. No acute bone abnormality. No lytic or blastic bone lesions are identified. IMPRESSION: 1. No source  identified for the patient's reported intracranial metastatic disease. PET CT examination may be more helpful for further evaluation. 2. 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 Point of obstruction likely at the upper and lower polar collecting system confluence at the left ureteropelvic junction. 3. Large right and small left pleural effusions. 4. Extensive right coronary artery calcification. 5. Severe distal colonic diverticulosis without superimposed acute inflammatory change. 6. Aortic atherosclerosis. Aortic Atherosclerosis (ICD10-I70.0). Electronically Signed   By: Fidela Salisbury M.D.   On: 06/05/2022 01:58   US Venous Img Lower Bilateral  Result Date: 06/03/2022 CLINICAL DATA:  Calf pain EXAM: Bilateral LOWER EXTREMITY VENOUS DOPPLER ULTRASOUND TECHNIQUE: Gray-scale sonography with compression, as well as color and duplex ultrasound, were performed to evaluate the deep venous system(s) from the level of the common femoral vein through the popliteal and proximal calf veins. COMPARISON:  None Available. FINDINGS: VENOUS Normal compressibility of the common femoral, superficial femoral, and popliteal veins, as well as the visualized calf veins. Visualized portions of profunda femoral vein and great saphenous vein unremarkable. No filling defects to suggest DVT on grayscale or color Doppler imaging. Doppler waveforms show normal direction of venous flow, normal respiratory plasticity and response to augmentation. OTHER None. Limitations: none IMPRESSION: Negative. Electronically Signed   By: Donavan Foil M.D.   On: 06/03/2022 19:51   CT Angio Chest PE W and/or Wo Contrast  Result Date: 06/03/2022 CLINICAL DATA:  Chest pain and shortness of breath. EXAM: CT ANGIOGRAPHY CHEST WITH CONTRAST TECHNIQUE: Multidetector CT imaging of the chest was  performed using the standard protocol during bolus administration of intravenous contrast. Multiplanar CT image reconstructions and MIPs were obtained to evaluate the vascular anatomy. RADIATION DOSE REDUCTION: This exam was performed according to the departmental dose-optimization program which includes automated exposure control, adjustment of the mA and/or kV according to patient size and/or use of iterative reconstruction technique. CONTRAST:  53m OMNIPAQUE IOHEXOL 350 MG/ML SOLN COMPARISON:  Chest x-ray, same date. FINDINGS: Cardiovascular: The heart is normal in size. No pericardial effusion. The ascending thoracic aorta is upper limits of size. No dissection. Scattered atherosclerotic calcifications. 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 The pulmonary arterial tree is well opacified. No filling defects to suggest pulmonary embolism. Mediastinum/Nodes: Mediastinal and hilar lymphadenopathy. Left paratracheal node measures 10 mm on image 26/4. Precarinal node on image 46/4 measures 13 mm. Subcarinal adenopathy measures 18 mm on image 55/4. There is also pre-vascular and AP window adenopathy and bilateral hilar adenopathy. The esophagus is grossly normal. Lungs/Pleura: Large right pleural effusion with significant overlying atelectasis and infiltrate. No discrete pulmonary mass is identified. Marked soft tissue thickening around the right hilum and right infrahilar region. Small left effusion and overlying atelectasis. Upper Abdomen: Enlarged upper abdominal lymph nodes including a 12 mm celiac axis node and retroperitoneal nodes. No hepatic or adrenal gland lesions. Advanced aortic calcifications. 14 mm calcified splenic artery aneurysm. Musculoskeletal: Asymmetric density involving the right breast. Could not exclude a mass. There is also enlarged right axillary lymph nodes which are worrisome. The bony thorax is intact. Review of the  MIP images confirms the above findings. IMPRESSION: 1. No CT findings for pulmonary embolism. 2. Large right pleural effusion with significant overlying  atelectasis and infiltrate. Marked soft tissue thickening around the right hilum and right infrahilar region. 3. Small left effusion and overlying atelectasis. 4. Mediastinal, hilar and upper abdominal lymphadenopathy worrisome for metastatic disease. 5. Asymmetric density involving the right breast and possible 5 x 3 cm mass. Recommend correlation with breast exam and mammogram and ultrasound as indicated. Right axillary adenopathy is worrisome. 6. Aortic atherosclerosis. Aortic Atherosclerosis (ICD10-I70.0). Electronically Signed   By: Marijo Sanes M.D.   On: 06/03/2022 16:23   DG Chest 2 View  Result Date: 06/03/2022 CLINICAL DATA:  Shortness of breath EXAM: CHEST - 2 VIEW COMPARISON:  01/02/2008 FINDINGS: Status post median sternotomy. Normal cardiac and mediastinal contours given AP technique. Moderate right and small left pleural effusions with associated atelectasis. No pneumothorax. No acute osseous abnormality. IMPRESSION: Moderate right and small left pleural effusions with associated atelectasis. Electronically Signed   By: Merilyn Baba M.D.   On: 06/03/2022 13:45    Microbiology: Results for orders placed or performed during the hospital encounter of 06/03/22  Resp Panel by RT-PCR (Flu A&B, Covid) Anterior Nasal Swab     Status: None   Collection Time: 06/03/22  2:43 PM   Specimen: Anterior Nasal Swab  Result Value Ref Range Status   SARS Coronavirus 2 by RT PCR NEGATIVE NEGATIVE Final    Comment: (NOTE) SARS-CoV-2 target nucleic acids are NOT DETECTED.  The SARS-CoV-2 RNA is generally detectable in upper respiratory specimens during the acute phase of infection. The lowest concentration of SARS-CoV-2 viral copies this assay can detect is 138 copies/mL. A negative result does not preclude SARS-Cov-2 infection and should not be used  as the sole basis for treatment or other patient management decisions. A negative result may occur with  improper specimen collection/handling, submission of specimen other than nasopharyngeal swab, presence of viral mutation(s) within the areas targeted by this assay, and inadequate number of viral copies(<138 copies/mL). A negative result must be combined with clinical observations, patient history, and epidemiological information. The expected result is Negative.  Fact Sheet for Patients:  EntrepreneurPulse.com.au  Fact Sheet for Healthcare Providers:  IncredibleEmployment.be  This test is no t yet approved or cleared by the [place] FDA and  has been authorized for detection and/or diagnosis of SARS-CoV-2 by FDA under an Emergency Use Authorization (EUA). This EUA will remain  in effect (meaning this test can be used) for the duration of the COVID-19 declaration under Section 564(b)(1) of the Act, 21 U.S.C.section 360bbb-3(b)(1), unless the authorization is terminated  or revoked sooner.       Influenza A by PCR NEGATIVE NEGATIVE Final   Influenza B by PCR NEGATIVE NEGATIVE Final    Comment: (NOTE) The Xpert Xpress SARS-CoV-2/FLU/RSV plus assay is intended as an aid in the diagnosis of influenza from Nasopharyngeal swab specimens and should not be used as a sole basis for treatment. Nasal washings and aspirates are unacceptable for Xpert Xpress SARS-CoV-2/FLU/RSV testing.  Fact Sheet for Patients: EntrepreneurPulse.com.au  Fact Sheet for Healthcare Providers: IncredibleEmployment.be  This test is not yet approved or cleared by the [place] FDA and has been authorized for detection and/or diagnosis of SARS-CoV-2 by FDA under an Emergency Use Authorization (EUA). This EUA will remain in effect (meaning this test can be used) for the duration of the COVID-19 declaration under Section 564(b)(1)  of the Act, 21 U.S.C. section 360bbb-3(b)(1), unless the authorization is terminated or revoked.  Performed at [facility], [address], [place], [place] [address]  Blood culture (routine x 2)     Status: None (Preliminary result)   Collection Time: 06/03/22  5:50 PM   Specimen: BLOOD LEFT ARM  Result Value Ref Range Status   Specimen Description   Final    BLOOD LEFT ARM BOTTLES DRAWN AEROBIC AND ANAEROBIC Blood Culture adequate volume Performed at [facility], [address] [place], [place] [address]    Special Requests   Final    NONE Performed at [facility], [address] [place], [place] [address]    Culture   Final    NO GROWTH 4 DAYS Performed at [facility], [place] [address], [place], [place] [address]    Report Status PENDING  Incomplete  Respiratory (~20 pathogens) panel by PCR     Status: None   Collection Time: 06/04/22  3:24 AM   Specimen: Nasopharyngeal Swab; Respiratory  Result Value Ref Range Status   Adenovirus NOT DETECTED NOT DETECTED Final   Coronavirus 229E NOT DETECTED NOT DETECTED Final    Comment: (NOTE) The Coronavirus on the Respiratory Panel, DOES NOT test for the novel  Coronavirus (2019 nCoV)    Coronavirus HKU1 NOT DETECTED NOT DETECTED Final   Coronavirus NL63 NOT DETECTED NOT DETECTED Final   Coronavirus OC43 NOT DETECTED NOT DETECTED Final   Metapneumovirus NOT DETECTED NOT DETECTED Final   Rhinovirus / Enterovirus NOT DETECTED NOT DETECTED Final   Influenza A NOT DETECTED NOT DETECTED Final   Influenza B NOT DETECTED NOT DETECTED Final   Parainfluenza Virus 1 NOT DETECTED NOT DETECTED Final   Parainfluenza Virus 2 NOT DETECTED NOT DETECTED Final   Parainfluenza Virus 3 NOT DETECTED NOT DETECTED Final   Parainfluenza Virus 4 NOT DETECTED NOT DETECTED Final   Respiratory Syncytial Virus NOT DETECTED NOT DETECTED Final   Bordetella pertussis NOT DETECTED NOT  DETECTED Final   Bordetella Parapertussis NOT DETECTED NOT DETECTED Final   Chlamydophila pneumoniae NOT DETECTED NOT DETECTED Final   Mycoplasma pneumoniae NOT DETECTED NOT DETECTED Final    Comment: Performed at [facility], [place]. [address] [place], [place] [address]  Culture, body fluid w Gram Stain-bottle     Status: None (Preliminary result)   Collection Time: 06/05/22  3:09 PM   Specimen: Pleura  Result Value Ref Range Status   Specimen Description PLEURAL  Final   Special Requests RIGHT  Final   Culture   Final    NO GROWTH 2 DAYS Performed at [facility] [address] [place], [place] [address]    Report Status PENDING  Incomplete  Gram stain     Status: None   Collection Time: 06/05/22  3:09 PM   Specimen: Pleura  Result Value Ref Range Status   Specimen Description PLEURAL  Final   Special Requests RIGHT  Final   Gram Stain   Final    NO ORGANISMS SEEN NO WBC SEEN Performed at [facility], [place] [address] [place], [place] [address]    Report Status 06/05/2022 FINAL  Final    Labs: CBC: Recent Labs  Lab 06/03/22 1310 06/05/22 1047  WBC 7.5 5.9  NEUTROABS  --  3.9  HGB 14.9 14.6  HCT 45.0 44.6  MCV 89.5 91.6  PLT 232 016   Basic Metabolic Panel: Recent Labs  Lab 06/03/22 1310 06/05/22 1047  NA 139 140  K 5.2* 3.9  CL 106 110  CO2 23 20*  GLUCOSE 106* 148*  BUN 18  10  CREATININE 0.65 0.73  CALCIUM 9.6 8.8*   Liver Function Tests: Recent Labs  Lab 06/05/22 1047  AST 32  ALT 24  ALKPHOS 99  BILITOT 1.0  PROT 6.5  ALBUMIN 3.1*   CBG: No results for input(s): "GLUCAP" in the last 168 hours.  Discharge time spent: greater than 30 minutes.  Signed: Oswald Hillock, MD [facility] 06/07/2022

## 2022-06-08 ENCOUNTER — Telehealth: Payer: Self-pay

## 2022-06-08 ENCOUNTER — Other Ambulatory Visit: Payer: Self-pay | Admitting: *Deleted

## 2022-06-08 ENCOUNTER — Other Ambulatory Visit: Payer: Self-pay | Admitting: Radiation Therapy

## 2022-06-08 DIAGNOSIS — J910 Malignant pleural effusion: Secondary | ICD-10-CM

## 2022-06-08 LAB — CULTURE, BLOOD (ROUTINE X 2): Culture: NO GROWTH

## 2022-06-08 LAB — CYTOLOGY - NON PAP

## 2022-06-08 LAB — CANCER ANTIGEN 27.29: CA 27.29: 31.1 U/mL (ref 0.0–38.6)

## 2022-06-08 LAB — LEGIONELLA PNEUMOPHILA SEROGP 1 UR AG: L. pneumophila Serogp 1 Ur Ag: NEGATIVE

## 2022-06-08 LAB — CANCER ANTIGEN 15-3: CA 15-3: 22.5 U/mL (ref 0.0–25.0)

## 2022-06-08 NOTE — Patient Outreach (Signed)
  Care Coordination [facility] Note Transition Care Management Unsuccessful Follow-up Telephone Call  Date of discharge and from where:  [facility] 06/[facility]22-06/07/22  Attempts:  1st Attempt  Reason for unsuccessful TCM follow-up call:  No answer/busy  Johnney Killian, RN, BSN, CCM Care Management Coordinator [facility] Phone: [phone]: [phone]

## 2022-06-09 ENCOUNTER — Telehealth: Payer: Self-pay

## 2022-06-09 NOTE — Patient Outreach (Signed)
  Care Coordination TOC Note Transition Care Management Unsuccessful Follow-up Telephone Call  Date of discharge and from where:  [facility] 06/[facility]/22-06/07/22  Attempts:  2nd Attempt  Reason for unsuccessful TCM follow-up call:  Unable to leave message-unidentified voicemail.  Johnney Killian, RN, BSN, CCM Care Management Coordinator [facility] Phone: [phone]: [phone]

## 2022-06-10 ENCOUNTER — Telehealth: Payer: Self-pay

## 2022-06-10 ENCOUNTER — Telehealth: Payer: Self-pay | Admitting: *Deleted

## 2022-06-10 LAB — CULTURE, BODY FLUID W GRAM STAIN -BOTTLE: Culture: NO GROWTH

## 2022-06-10 NOTE — Telephone Encounter (Signed)
Patient should receive monitor 06/11/2022.  If assistance need in application, I could get [name] in at our [employer], early next week.  Daughter aware there is a QR code to scan for an instructional video, plus a manual included in the kit. She believes they will be able to apply it without an appointment.

## 2022-06-10 NOTE — Patient Outreach (Signed)
  Care Coordination TOC Note Transition Care Management Unsuccessful Follow-up Telephone Call  Date of discharge and from where:  [facility]22-06/07/22  Attempts:  3rd Attempt  Reason for unsuccessful TCM follow-up call:  Unable to leave message-unidentified voicemail.  Johnney Killian, RN, BSN, CCM Care Management Coordinator [facility] Phone: [phone]: [phone]

## 2022-06-11 NOTE — Progress Notes (Signed)
   No care team member to display  DIAGNOSIS: No diagnosis found.  SUMMARY OF ONCOLOGIC HISTORY: Oncology History   No history exists.    CHIEF COMPLIANT:   INTERVAL HISTORY: [name] is a   ALLERGIES:  is allergic to lipitor [atorvastatin] and simvastatin.  MEDICATIONS:  Current Outpatient Medications  Medication Sig Dispense Refill   aspirin EC 81 MG tablet Take 1 tablet (81 mg total) by mouth daily. Swallow whole. 90 tablet 2   clopidogrel (PLAVIX) 75 MG tablet Take 1 tablet (75 mg total) by mouth daily for 21 days. 21 tablet 0   nitroGLYCERIN (NITROLINGUAL) 0.4 MG/SPRAY spray Place 1 spray under the tongue every 5 (five) minutes x 3 doses as needed for chest pain. If no relief after that then call 911. (Patient not taking: Reported on 06/03/2022) 25 g 6   pravastatin (PRAVACHOL) 80 MG tablet Take 1 tablet (80 mg total) by mouth daily at 6 PM. 30 tablet 3   No current facility-administered medications for this visit.    PHYSICAL EXAMINATION: ECOG PERFORMANCE STATUS: {CHL ONC ECOG PS:[phone]}  There were no vitals filed for this visit. There were no vitals filed for this visit.  BREAST:*** No palpable masses or nodules in either right or left breasts. No palpable axillary supraclavicular or infraclavicular adenopathy no breast tenderness or nipple discharge. (exam performed in the presence of a chaperone)  LABORATORY DATA:  I have reviewed the data as listed    Latest Ref Rng & Units 06/05/2022   10:47 AM 06/03/2022    1:10 PM 03/24/2016   10:25 AM  CMP  Glucose 70 - 99 mg/dL 148  106  111   BUN 8 - 23 mg/dL '10  18  14   '$ Creatinine 0.44 - 1.00 mg/dL 0.73  0.65  0.68   Sodium 135 - 145 mmol/L 140  139  142   Potassium 3.5 - 5.1 mmol/L 3.9  5.2  4.7   Chloride 98 - 111 mmol/L 110  106  108   CO2 22 - 32 mmol/L '20  23  26   '$ Calcium 8.9 - 10.3 mg/dL 8.8  9.6  9.3   Total Protein 6.5 - 8.1 g/dL 6.5   6.6   Total Bilirubin 0.3 - 1.2 mg/dL 1.0   1.0   Alkaline  Phos 38 - 126 U/L 99   67   AST 15 - 41 U/L 32   26   ALT 0 - 44 U/L 24   23     Lab Results  Component Value Date   WBC 5.9 06/05/2022   HGB 14.6 06/05/2022   HCT 44.6 06/05/2022   MCV 91.6 06/05/2022   PLT 216 06/05/2022   NEUTROABS 3.9 06/05/2022    ASSESSMENT & PLAN:  No problem-specific Assessment & Plan notes found for this encounter.    No orders of the defined types were placed in this encounter.  The patient has a good understanding of the overall plan. she agrees with it. she will call with any problems that may develop before the next visit here. Total time spent: 30 mins including face to face time and time spent for planning, charting and co-ordination of care   Suzzette Righter, [place] 06/11/22    I Gardiner Coins am acting as a Education administrator for [employer]  ***

## 2022-06-14 ENCOUNTER — Telehealth: Payer: Self-pay | Admitting: Radiation Oncology

## 2022-06-14 ENCOUNTER — Inpatient Hospital Stay: Payer: Medicare Other | Attending: Hematology and Oncology | Admitting: Hematology and Oncology

## 2022-06-14 ENCOUNTER — Ambulatory Visit (HOSPITAL_COMMUNITY)
Admission: RE | Admit: 2022-06-14 | Discharge: 2022-06-14 | Disposition: A | Discharge status: Home / Self Care | Payer: Medicare Other | Source: Ambulatory Visit | Attending: Hematology and Oncology | Admitting: Hematology and Oncology

## 2022-06-14 ENCOUNTER — Inpatient Hospital Stay: Payer: Medicare Other

## 2022-06-14 VITALS — BP 144/73 | HR 95 | Temp 97.8°F | Resp 20 | Ht 62.0 in | Wt 132.7 lb

## 2022-06-14 DIAGNOSIS — C801 Malignant (primary) neoplasm, unspecified: Secondary | ICD-10-CM | POA: Insufficient documentation

## 2022-06-14 DIAGNOSIS — Z79899 Other long term (current) drug therapy: Secondary | ICD-10-CM | POA: Diagnosis not present

## 2022-06-14 DIAGNOSIS — J910 Malignant pleural effusion: Secondary | ICD-10-CM | POA: Insufficient documentation

## 2022-06-14 DIAGNOSIS — C7931 Secondary malignant neoplasm of brain: Secondary | ICD-10-CM | POA: Diagnosis present

## 2022-06-14 DIAGNOSIS — N6310 Unspecified lump in the right breast, unspecified quadrant: Secondary | ICD-10-CM | POA: Diagnosis not present

## 2022-06-14 NOTE — Telephone Encounter (Signed)
12/18 @ 10:52 am Left voicemail for patient to call our office to be schedule for consult.

## 2022-06-14 NOTE — Assessment & Plan Note (Signed)
CT CAP 06/03/2022: Large right pleural effusion, marked soft tissue thickening around the right hilum and right infrahilar region, mediastinal hilar and upper abdominal lymph nodes, right breast mass 5 cm  06/06/2022: Brain MRI: 2 small lesions left frontal lobe 3 mm and 4 mm concerning for metastatic disease, additional dural based contrast-enhancing lesion left frontal convexity possibly small meningioma, acute/subacute infarcts involving the left basal ganglia and right centrum semiovale  Hospitalization 06/03/2022-06/07/2022: Fatigue, shortness of breath Thoracentesis 06/04/2022: Negative for malignant cells  Treatment plan: We will obtain a mammogram and ultrasound and biopsy of the breast mass.  This will enable [place] to determine the type of cancer.  It is highly suspicious for metastatic breast cancer. Given her age and comorbidities, treatment options can be quite limited.  This will be reassessed once we have more tissue diagnosis.

## 2022-06-15 ENCOUNTER — Telehealth: Payer: Self-pay | Admitting: Radiation Oncology

## 2022-06-15 ENCOUNTER — Ambulatory Visit: Payer: Medicare Other | Admitting: Hematology and Oncology

## 2022-06-15 ENCOUNTER — Ambulatory Visit: Payer: Medicare Other | Attending: Physician Assistant

## 2022-06-15 DIAGNOSIS — R9431 Abnormal electrocardiogram [ECG] [EKG]: Secondary | ICD-10-CM

## 2022-06-15 DIAGNOSIS — R000 Tachycardia, unspecified: Secondary | ICD-10-CM

## 2022-06-15 DIAGNOSIS — I639 Cerebral infarction, unspecified: Secondary | ICD-10-CM | POA: Diagnosis not present

## 2022-06-15 NOTE — Telephone Encounter (Signed)
12/19 @ 11:05 am Patient's daughter Benjamine Mola call to let us know that patient and her discuss treatment options with patient surg and Dr. Lindi Adie that radiation may not be an option and did not want her to go through these appointments if not needed.  Left voicemail with Dr. Geralyn Flash nurse to confirm.  Waiting on call back.

## 2022-06-15 NOTE — Telephone Encounter (Signed)
12/19 @ 9:16 am Left voicemail for patient to call our office to be schedule for consult.  Also spoke to patient's daughter Sharon/will call back to confirm, if date/time works for them.

## 2022-06-24 ENCOUNTER — Ambulatory Visit
Admission: RE | Admit: 2022-06-24 | Discharge: 2022-06-24 | Disposition: A | Discharge status: Home / Self Care | Payer: Medicare Other | Source: Ambulatory Visit | Attending: Hematology | Admitting: Hematology

## 2022-06-24 ENCOUNTER — Other Ambulatory Visit: Payer: Self-pay | Admitting: Hematology

## 2022-06-24 DIAGNOSIS — N6310 Unspecified lump in the right breast, unspecified quadrant: Secondary | ICD-10-CM

## 2022-06-24 DIAGNOSIS — J910 Malignant pleural effusion: Secondary | ICD-10-CM

## 2022-06-24 DIAGNOSIS — R599 Enlarged lymph nodes, unspecified: Secondary | ICD-10-CM

## 2022-06-29 ENCOUNTER — Ambulatory Visit
Admission: RE | Admit: 2022-06-29 | Discharge: 2022-06-29 | Disposition: A | Discharge status: Home / Self Care | Payer: Medicare Other | Source: Ambulatory Visit | Attending: Hematology | Admitting: Hematology

## 2022-06-29 DIAGNOSIS — N6310 Unspecified lump in the right breast, unspecified quadrant: Secondary | ICD-10-CM

## 2022-06-29 DIAGNOSIS — R599 Enlarged lymph nodes, unspecified: Secondary | ICD-10-CM

## 2022-06-29 DIAGNOSIS — J910 Malignant pleural effusion: Secondary | ICD-10-CM

## 2022-06-29 HISTORY — PX: BREAST BIOPSY: SHX20

## 2022-06-29 NOTE — Progress Notes (Signed)
Patient Care Team: Pcp, No as PCP - General  DIAGNOSIS:  Encounter Diagnoses  Name Primary?   Malignant pleural effusion Yes   Mass of right breast, unspecified quadrant       CHIEF COMPLIANT: Metastatic cancer    INTERVAL HISTORY: [name] is a 89 y.o. female is here because of recent diagnosis of pleural effusion with lymphadenopathy and a breast mass. She presents to the clinic for a follow-up.  She underwent thoracentesis which did not show any malignant cells in the pleural fluid.  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  She is here accompanied by her family to discuss her treatment plan.  She had diplopia and is using an eye cover to prevent the double vision.  She is able to walk with the help of walker.   ALLERGIES:  is allergic to lipitor [atorvastatin] and simvastatin.  MEDICATIONS:  Current Outpatient Medications  Medication Sig Dispense Refill   letrozole (FEMARA) 2.5 MG tablet Take 1 tablet (2.5 mg total) by mouth daily. 90 tablet 3   aspirin EC 81 MG tablet Take 1 tablet (81 mg total) by mouth daily. Swallow whole. 90 tablet 2   pravastatin (PRAVACHOL) 80 MG tablet Take 1 tablet (80 mg total) by mouth daily at 6 PM. 30 tablet 3   No current facility-administered medications for this visit.    PHYSICAL EXAMINATION: ECOG PERFORMANCE STATUS: 1 - Symptomatic but completely ambulatory  Vitals:   07/05/22 1259  BP: 117/77  Pulse: (!) 102  Resp: 18  Temp: 97.7 F (36.5 C)  SpO2: 99%   Filed Weights   07/05/22 1259  Weight: 132 lb (59.9 kg)      LABORATORY DATA:  I have reviewed the data as listed    Latest Ref Rng & Units 06/05/2022   10:47 AM 06/03/2022    1:10 PM 03/24/2016   10:25 AM  CMP  Glucose 70 - 99 mg/dL 148  106  111   BUN 8 - 23 mg/dL '10  18  14   '$ Creatinine 0.44 - 1.00 mg/dL 0.73  0.65  0.68   Sodium 135 - 145 mmol/L  140  139  142   Potassium 3.5 - 5.1 mmol/L 3.9  5.2  4.7   Chloride 98 - 111 mmol/L 110  106  108   CO2 22 - 32 mmol/L '20  23  26   '$ Calcium 8.9 - 10.3 mg/dL 8.8  9.6  9.3   Total Protein 6.5 - 8.1 g/dL 6.5   6.6   Total Bilirubin 0.3 - 1.2 mg/dL 1.0   1.0   Alkaline Phos 38 - 126 U/L 99   67   AST 15 - 41 U/L 32   26   ALT 0 - 44 U/L 24   23     Lab Results  Component Value Date   WBC 5.9 06/05/2022   HGB 14.6 06/05/2022   HCT 44.6 06/05/2022   MCV 91.6 06/05/2022   PLT 216 06/05/2022   NEUTROABS 3.9 06/05/2022    ASSESSMENT & PLAN:  Malignant pleural effusion CT CAP 06/03/2022: Large right pleural effusion, marked soft tissue thickening around the right hilum and right infrahilar region, mediastinal hilar and upper abdominal lymph nodes, right breast mass 5 cm   06/06/2022: Brain MRI: 2 small lesions left frontal lobe 3 mm and 4 mm concerning for metastatic disease,  additional dural based contrast-enhancing lesion left frontal convexity possibly small meningioma, acute/subacute infarcts involving the left basal ganglia and right centrum semiovale   Hospitalization 06/03/2022-06/07/2022: Fatigue, shortness of breath Thoracentesis 06/04/2022: Negative for malignant cells U/S Right breast: 06/24/22: Rt breast mass: 6.3 cm, 3 LN axilla Rt Breast Biopsy: 06/29/22: Grade 2 IDC with extracellular mucin, lymph node positive for IDC ER 100%, PR 100%, Ki67 25%, HER2 0 negative    Treatment plan:  Palliative treatment with antiestrogen therapy with letrozole 2.5 mg daily Referral to radiation oncology for consideration for palliative brain radiation CT CAP in 2 months and follow-up after that to discuss results    Orders Placed This Encounter  Procedures   CT CHEST ABDOMEN PELVIS W CONTRAST    Standing Status:   Future    Standing Expiration Date:   07/06/2023    Order Specific Question:   If indicated for the ordered procedure, I authorize the administration of contrast media per  Radiology protocol    Answer:   Yes    Order Specific Question:   Does the patient have a contrast media/X-ray dye allergy?    Answer:   No    Order Specific Question:   Preferred imaging location?    Answer:   [facility]    Order Specific Question:   Release to patient    Answer:   Immediate    Order Specific Question:   Is Oral Contrast requested for this exam?    Answer:   Yes, Per Radiology protocol   The patient has a good understanding of the overall plan. she agrees with it. she will call with any problems that may develop before the next visit here. Total time spent: 30 mins including face to face time and time spent for planning, charting and co-ordination of care   Harriette Ohara, MD 07/05/22    I Gardiner Coins am acting as a Education administrator for [employer]  I have reviewed the above documentation for accuracy and completeness, and I agree with the above.

## 2022-06-30 ENCOUNTER — Telehealth: Payer: Self-pay | Admitting: *Deleted

## 2022-06-30 NOTE — Telephone Encounter (Signed)
Received call from [facility] physical therapist with [facility] requesting verbal MD orders for 6 additional home health visits.  Per MD okay to proceed, Ernst Bowler verbalized understanding and states she will fax orders to our office to be signed.

## 2022-07-02 ENCOUNTER — Ambulatory Visit: Payer: Medicare Other | Admitting: Hematology and Oncology

## 2022-07-05 ENCOUNTER — Inpatient Hospital Stay: Payer: Medicare Other | Attending: Hematology and Oncology | Admitting: Hematology and Oncology

## 2022-07-05 ENCOUNTER — Other Ambulatory Visit: Payer: Self-pay

## 2022-07-05 VITALS — BP 117/77 | HR 102 | Temp 97.7°F | Resp 18 | Wt 132.0 lb

## 2022-07-05 DIAGNOSIS — C801 Malignant (primary) neoplasm, unspecified: Secondary | ICD-10-CM | POA: Insufficient documentation

## 2022-07-05 DIAGNOSIS — Z7982 Long term (current) use of aspirin: Secondary | ICD-10-CM | POA: Insufficient documentation

## 2022-07-05 DIAGNOSIS — J910 Malignant pleural effusion: Secondary | ICD-10-CM | POA: Diagnosis not present

## 2022-07-05 DIAGNOSIS — Z79899 Other long term (current) drug therapy: Secondary | ICD-10-CM | POA: Diagnosis not present

## 2022-07-05 DIAGNOSIS — C7931 Secondary malignant neoplasm of brain: Secondary | ICD-10-CM | POA: Diagnosis present

## 2022-07-05 DIAGNOSIS — N6310 Unspecified lump in the right breast, unspecified quadrant: Secondary | ICD-10-CM | POA: Insufficient documentation

## 2022-07-05 MED ORDER — LETROZOLE 2.5 MG PO TABS: 2.5000 mg | ORAL_TABLET | Freq: Every day | ORAL | 3 refills | Status: DC

## 2022-07-05 NOTE — Assessment & Plan Note (Signed)
CT CAP 06/03/2022: Large right pleural effusion, marked soft tissue thickening around the right hilum and right infrahilar region, mediastinal hilar and upper abdominal lymph nodes, right breast mass 5 cm   06/06/2022: Brain MRI: 2 small lesions left frontal lobe 3 mm and 4 mm concerning for metastatic disease, additional dural based contrast-enhancing lesion left frontal convexity possibly small meningioma, acute/subacute infarcts involving the left basal ganglia and right centrum semiovale   Hospitalization 06/03/2022-06/07/2022: Fatigue, shortness of breath Thoracentesis 06/04/2022: Negative for malignant cells U/S Right breast: 06/24/22: Rt breast mass: 6.3 cm, 3 LN axilla Rt Breast Biopsy: 06/29/22: Grade 2 IDC with extracellular mucin, lymph node positive for IDC ER 100%, PR 100%, Ki67 25%, HER2 0 negative  Extensive comorbidities:  Treatment plan: Palliative treatment with antiestrogen therapy with letrozole 2.5 mg daily

## 2022-07-06 ENCOUNTER — Ambulatory Visit: Payer: Medicare Other | Admitting: Hematology and Oncology

## 2022-07-07 ENCOUNTER — Encounter: Payer: Self-pay | Admitting: *Deleted

## 2022-07-23 ENCOUNTER — Ambulatory Visit: Payer: Medicare Other | Attending: Cardiovascular Disease | Admitting: Cardiovascular Disease

## 2022-07-23 ENCOUNTER — Encounter: Payer: Self-pay | Admitting: Cardiovascular Disease

## 2022-07-23 VITALS — BP 126/79 | HR 87 | Ht 62.0 in | Wt 129.0 lb

## 2022-07-23 DIAGNOSIS — E7801 Familial hypercholesterolemia: Secondary | ICD-10-CM

## 2022-07-23 DIAGNOSIS — C782 Secondary malignant neoplasm of pleura: Secondary | ICD-10-CM

## 2022-07-23 DIAGNOSIS — Z8679 Personal history of other diseases of the circulatory system: Secondary | ICD-10-CM | POA: Diagnosis not present

## 2022-07-23 DIAGNOSIS — I63512 Cerebral infarction due to unspecified occlusion or stenosis of left middle cerebral artery: Secondary | ICD-10-CM | POA: Diagnosis not present

## 2022-07-23 DIAGNOSIS — E782 Mixed hyperlipidemia: Secondary | ICD-10-CM

## 2022-07-23 DIAGNOSIS — I2510 Atherosclerotic heart disease of native coronary artery without angina pectoris: Secondary | ICD-10-CM | POA: Diagnosis not present

## 2022-07-23 DIAGNOSIS — C50911 Malignant neoplasm of unspecified site of right female breast: Secondary | ICD-10-CM

## 2022-07-23 MED ORDER — PRAVASTATIN SODIUM 80 MG PO TABS: 80.0000 mg | ORAL_TABLET | Freq: Every day | ORAL | 3 refills | Status: DC

## 2022-07-23 NOTE — Progress Notes (Unsigned)
Patient ID: [name], female   DOB: Jan 09, 1928, 89 y.o.   MRN: [id number]    Cardiology Office Note    Date:  07/24/2022   ID:  [name], DOB 13-May-1928, MRN [id number]  PCP:  Pcp, No  Cardiologist:   Sanda Klein, MD   Chief Complaint  Patient presents with   Coronary Artery Disease    History of Present Illness:  [name] is a 89 y.o. female with a remote history of severe hypercholesterolemia, type B aortic dissection and CAD s/p CABG 2009, returning for her first follow-up visit since 2017.  She is no longer living independently, but with one of her daughters.  Cognitive function has deteriorated.  Memory is clearly a problem.  She was hospitalized in December with shortness of breath and hypoxemia.  She was found to have a large right pleural effusion as well as a mass in her right breast mediastinal and upper abdominal lymphadenopathy.  She had thoracentesis with removal of 650 mL of fluid and improvement in dyspnea.  MRI of the brain showed a couple of small areas concerning for subacute stroke in the left basal ganglia, but also 2 separate dural based masses, suggestive of metastases.  Subsequent breast biopsy showed invasive ductal carcinoma, which was also found in a lymph node biopsy.  She has been started on treatment with letrozole and has follow-up scheduled with Dr. Lindi Adie in the oncology clinic.  They have been referred to a neuro oncologist to discuss radiation therapy.    She has also worn a 30-day event monitor that shows isolated rare PACs and PVCs and a single 15 beat run of ectopic atrial tachycardia, but no evidence of atrial fibrillation.  Duplex ultrasound shows near normal carotid arteries  She had not been taking her statin and her LDL cholesterol was again severely elevated at 217.  Pravastatin 80 mg daily has been restarted.  She has prediabetes with hemoglobin A1c of 6.4%, despite the fact that she is not overweight.  She has normal renal function.   She has never smoked.  Her blood pressure is normal.  Echo showed normal left ventricular systolic function, severe mitral annulus calcification and mild-moderate aortic valve stenosis.  Of note, her type B aortic dissection is completely resolved on the CT angiograms performed in December 2023.  MR angiogram of the head shows generalized intracranial atherosclerosis most notably causing high-grade stenosis in the distal left vertebral artery and mild to moderate stenosis of the left M1 segment.  She initially presented with an acute myocardial infarction in May of 2009. Cardiac catheterization showed culprit lesions to be in the right coronary artery and she received 2 drug-eluting stents to this vessel (mid RCA and ostial PDA). She also had high-grade stenosis in the proximal LAD and was brought in for a stage procedure later on the same admission. 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 The procedure was aborted and she subsequently underwent bypass surgery. She has done well since that time without any new coronary events. She does not smoke, does not have hypertension and does not have diabetes mellitus but has severe mixed hyperlipidemia and is intolerant to statins. Previous treatment with Vytorin, Lipitor daily Crestor and even once weekly Crestor was not tolerated due to weakness  Past Medical History:  Diagnosis Date   CAD (coronary artery disease)  Carotid atherosclerosis    Dyslipidemia    H/O aortic dissection    Type B   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    Past Surgical History:  Procedure Laterality Date   BREAST BIOPSY Right 06/29/2022   [place] RT BREAST BX W LOC DEV 1ST LESION IMG BX SPEC US GUIDE 06/29/2022 [facility]MO[facility]   CORONARY ANGIOPLASTY WITH  STENT PLACEMENT  11/21/2007   stenting of the RCA followed by staged LAD intervention   CORONARY ARTERY BYPASS GRAFT  11/30/2007   LIMA to LAD,SVG to 1st and 2nd diagonal arteries   NM MYOVIEW LTD  07/13/2010   normal    Current Medications: Outpatient Medications Prior to Visit  Medication Sig Dispense Refill   aspirin EC 81 MG tablet Take 1 tablet (81 mg total) by mouth daily. Swallow whole. 90 tablet 2   letrozole (FEMARA) 2.5 MG tablet Take 1 tablet (2.5 mg total) by mouth daily. 90 tablet 3   pravastatin (PRAVACHOL) 80 MG tablet Take 1 tablet (80 mg total) by mouth daily at 6 PM. 30 tablet 3   No facility-administered medications prior to visit.     Allergies:   Lipitor [atorvastatin] and Simvastatin   Social History   Socioeconomic History   Marital status: Widowed    Spouse name: Not on file   Number of children: Not on file   Years of education: Not on file   Highest education level: Not on file  Occupational History   Not on file  Tobacco Use   Smoking status: Never   Smokeless tobacco: Never  Substance and Sexual Activity   Alcohol use: No    Alcohol/week: 0.0 standard drinks of alcohol   Drug use: No   Sexual activity: Not on file  Other Topics Concern   Not on file  Social History Narrative   Not on file   Social Determinants of Health   Financial Resource Strain: Not on file  Food Insecurity: No Food Insecurity (06/04/2022)   Hunger Vital Sign    Worried About Running Out of Food in the Last Year: Never true    Ran Out of Food in the Last Year: Never true  Transportation Needs: No Transportation Needs (06/09/2022)   PRAPARE - Hydrologist (Medical): No    Lack of Transportation (Non-Medical): No  Physical Activity: Not on file  Stress: Not on file  Social Connections: Not on file     Family History:  The patient's family history includes Diabetes in her mother; Heart failure in her father and mother; Stroke in her mother.    ROS:   Please see the history of present illness.    ROS All other systems reviewed and are negative.   PHYSICAL EXAM:   VS:  BP 126/79 Comment: left arm  Pulse 87   Ht '5\' 2"'$  (1.575 m)   Wt 129 lb (58.5 kg)   SpO2 95%   BMI 23.59 kg/m      General: Alert, oriented x3, no distress, appears elderly and frail Head: no evidence of trauma, PERRL, EOMI, no exophtalmos or lid lag, no myxedema, no xanthelasma; normal ears, nose and oropharynx Neck: normal jugular venous pulsations and no hepatojugular reflux; brisk carotid pulses without delay and no carotid bruits Chest: Diminished breath sounds and dullness to percussion about a quarter the way up in the posterior  right lung base, otherwise clear Cardiovascular: normal position and quality of the apical impulse, regular rhythm, normal first and second heart sounds, early peaking 2/6 aortic ejection murmur, no diastolic murmurs, rubs or gallops Abdomen: no tenderness or distention, no masses by palpation, no abnormal pulsatility or arterial bruits, normal bowel sounds, no hepatosplenomegaly Extremities: no clubbing, cyanosis or edema; 2+ radial, ulnar and brachial pulses bilaterally; 2+ right femoral, posterior tibial and dorsalis pedis pulses; 2+ left femoral, posterior tibial and dorsalis pedis pulses; no subclavian or femoral bruits Neurological: grossly nonfocal.  Tends to repeat herself and suggest some memory problems Psych: Normal mood and affect   Wt Readings from Last 3 Encounters:  07/23/22 129 lb (58.5 kg)  07/05/22 132 lb (59.9 kg)  06/14/22 132 lb 11.2 oz (60.2 kg)      Studies/Labs Reviewed:   EKG:  EKG is not ordered today.  The ekg ordered 06/03/2022 demonstrates normal sinus rhythm, early RS transition in lead V2 and T wave inversion leads V2-V3, borderline prolonged QTc at 467 ms  Recent Labs: 06/03/2022: B Natriuretic Peptide 61.2 06/05/2022: ALT 24; BUN 10; Creatinine, Ser 0.73; Hemoglobin 14.6; Platelets 216;  Potassium 3.9; Sodium 140   Lipid Panel    Component Value Date/Time   CHOL 279 (H) 06/06/2022 0438   TRIG 101 06/06/2022 0438   HDL 42 06/06/2022 0438   CHOLHDL 6.6 06/06/2022 0438   VLDL 20 06/06/2022 0438   LDLCALC 217 (H) 06/06/2022 0438     ASSESSMENT:    1. Coronary artery disease involving native coronary artery of native heart without angina pectoris   2. Familial hypercholesterolemia   3. History of aortic dissection   4. Arterial ischemic stroke, MCA, left, acute ([place])   5. Breast cancer metastasized to pleura, right ([facility])      PLAN:  In order of problems listed above:  CAD: It has been over 14 years since her bypass surgery and she does not have any complaints of angina or dyspnea at this time.  She is taking low-dose aspirin and a statin. HLP: Severely elevated LDL suggesting familial heterozygous hypercholesterolemia.  It is unlikely that pravastatin will bring [place] anywhere near to target range, however long-term prognosis is primarily dictated by her age of 89 and the presence of metastatic invasive ductal carcinoma of the breast.  It is not likely that aggressive lowering of her LDL cholesterol will make a difference at this point. Type B Ao dissection: Intimal flap has healed. Ischemic strokes: In the left basal ganglia where there is some degree of intracerebral arterial stenoses.  No evidence of atrial fibrillation on arrhythmia monitoring.  Normal left ventricular systolic function and left atrial size.  No evidence of significant carotid artery disease. Metastatic breast cancer: Involving the right pleural space, lymph nodes, suspected cerebral metastases.  I have not examined her recently until today, so not sure if the pleural effusion is reaccumulating.  There are plans to reassess her with a CT of the chest via the oncology clinic.  She is only on letrozole in view of her age and functional status.  If there is evidence of significant regression of her malignancy  in a few months, we may reconsider our plans to aggressively treat her LDL cholesterol.    Medication Adjustments/Labs and Tests Ordered: Current medicines are reviewed at length with the patient today.  Concerns regarding medicines are outlined above.  Medication changes, Labs and Tests ordered today are listed in the Patient Instructions below. Patient Instructions  Medication Instructions:  No changes *If you need a refill on your cardiac medications before your next appointment, please call your pharmacy*   Lab Work: Fasting labs in 3 months: Lipid panel If you have labs (blood work) drawn today and your tests are completely normal, you will receive your results only by: [place] (if you have MyChart) OR A paper copy in the mail If you have any lab test that is abnormal or we need to change your treatment, we will call you to review the results.   Follow-Up: At [facility], you and your health needs are our priority.  As part of our continuing mission to provide you with exceptional heart care, we have created designated Provider Care Teams.  These Care Teams include your primary Cardiologist (physician) and Advanced Practice Providers (APPs -  Physician Assistants and Nurse Practitioners) who all work together to provide you with the care you need, when you need it.  We recommend signing up for the patient portal called "MyChart".  Sign up information is provided on this After Visit Summary.  MyChart is used to connect with patients for Virtual Visits (Telemedicine).  Patients are able to view lab/test results, encounter notes, upcoming appointments, etc.  Non-urgent messages can be sent to your provider as well.   To learn more about what you can do with MyChart, go to [email or web address].    Your next appointment:   5 month(s)  Provider:   Dr Sallyanne Kuster    Signed, Sanda Klein, MD  07/24/2022 4:41 PM    [facility] [place], [place], [place]  [address] Phone: [phone]; Fax: [phone]

## 2022-07-23 NOTE — Patient Instructions (Signed)
Medication Instructions:  No changes *If you need a refill on your cardiac medications before your next appointment, please call your pharmacy*   Lab Work: Fasting labs in 3 months: Lipid panel If you have labs (blood work) drawn today and your tests are completely normal, you will receive your results only by: [place] (if you have MyChart) OR A paper copy in the mail If you have any lab test that is abnormal or we need to change your treatment, we will call you to review the results.   Follow-Up: At [facility], you and your health needs are our priority.  As part of our continuing mission to provide you with exceptional heart care, we have created designated Provider Care Teams.  These Care Teams include your primary Cardiologist (physician) and Advanced Practice Providers (APPs -  Physician Assistants and Nurse Practitioners) who all work together to provide you with the care you need, when you need it.  We recommend signing up for the patient portal called "MyChart".  Sign up information is provided on this After Visit Summary.  MyChart is used to connect with patients for Virtual Visits (Telemedicine).  Patients are able to view lab/test results, encounter notes, upcoming appointments, etc.  Non-urgent messages can be sent to your provider as well.   To learn more about what you can do with MyChart, go to [email or web address].    Your next appointment:   5 month(s)  Provider:   Dr Sallyanne Kuster

## 2022-07-24 ENCOUNTER — Encounter: Payer: Self-pay | Admitting: Cardiovascular Disease

## 2022-07-30 ENCOUNTER — Emergency Department (HOSPITAL_COMMUNITY): Payer: Medicare Other

## 2022-07-30 ENCOUNTER — Encounter (HOSPITAL_COMMUNITY): Payer: Self-pay | Admitting: Internal Medicine

## 2022-07-30 ENCOUNTER — Observation Stay (HOSPITAL_COMMUNITY): Payer: Medicare Other

## 2022-07-30 ENCOUNTER — Observation Stay (HOSPITAL_COMMUNITY)
Admission: EM | Admit: 2022-07-30 | Discharge: 2022-07-31 | Disposition: A | Discharge status: Home Health | Payer: Medicare Other | Attending: Internal Medicine | Admitting: Internal Medicine

## 2022-07-30 ENCOUNTER — Other Ambulatory Visit: Payer: Self-pay

## 2022-07-30 DIAGNOSIS — C7931 Secondary malignant neoplasm of brain: Secondary | ICD-10-CM | POA: Diagnosis not present

## 2022-07-30 DIAGNOSIS — Z79899 Other long term (current) drug therapy: Secondary | ICD-10-CM | POA: Insufficient documentation

## 2022-07-30 DIAGNOSIS — M6281 Muscle weakness (generalized): Secondary | ICD-10-CM | POA: Insufficient documentation

## 2022-07-30 DIAGNOSIS — Y9301 Activity, walking, marching and hiking: Secondary | ICD-10-CM | POA: Insufficient documentation

## 2022-07-30 DIAGNOSIS — C50911 Malignant neoplasm of unspecified site of right female breast: Secondary | ICD-10-CM | POA: Diagnosis present

## 2022-07-30 DIAGNOSIS — W19XXXA Unspecified fall, initial encounter: Secondary | ICD-10-CM | POA: Diagnosis not present

## 2022-07-30 DIAGNOSIS — R569 Unspecified convulsions: Secondary | ICD-10-CM

## 2022-07-30 DIAGNOSIS — R2681 Unsteadiness on feet: Secondary | ICD-10-CM | POA: Insufficient documentation

## 2022-07-30 DIAGNOSIS — I2510 Atherosclerotic heart disease of native coronary artery without angina pectoris: Secondary | ICD-10-CM | POA: Diagnosis not present

## 2022-07-30 DIAGNOSIS — Z1152 Encounter for screening for COVID-19: Secondary | ICD-10-CM | POA: Diagnosis not present

## 2022-07-30 DIAGNOSIS — R6510 Systemic inflammatory response syndrome (SIRS) of non-infectious origin without acute organ dysfunction: Secondary | ICD-10-CM | POA: Insufficient documentation

## 2022-07-30 DIAGNOSIS — R55 Syncope and collapse: Secondary | ICD-10-CM

## 2022-07-30 DIAGNOSIS — Z951 Presence of aortocoronary bypass graft: Secondary | ICD-10-CM | POA: Insufficient documentation

## 2022-07-30 DIAGNOSIS — R079 Chest pain, unspecified: Secondary | ICD-10-CM | POA: Diagnosis present

## 2022-07-30 DIAGNOSIS — Z7982 Long term (current) use of aspirin: Secondary | ICD-10-CM | POA: Diagnosis not present

## 2022-07-30 DIAGNOSIS — Z9181 History of falling: Secondary | ICD-10-CM | POA: Diagnosis not present

## 2022-07-30 DIAGNOSIS — J910 Malignant pleural effusion: Secondary | ICD-10-CM | POA: Diagnosis present

## 2022-07-30 DIAGNOSIS — Z8673 Personal history of transient ischemic attack (TIA), and cerebral infarction without residual deficits: Secondary | ICD-10-CM | POA: Diagnosis not present

## 2022-07-30 DIAGNOSIS — R1310 Dysphagia, unspecified: Secondary | ICD-10-CM | POA: Diagnosis not present

## 2022-07-30 DIAGNOSIS — Y92009 Unspecified place in unspecified non-institutional (private) residence as the place of occurrence of the external cause: Secondary | ICD-10-CM | POA: Diagnosis not present

## 2022-07-30 LAB — CBC WITH DIFFERENTIAL/PLATELET
Abs Immature Granulocytes: 0.06 10*3/uL (ref 0.00–0.07)
Basophils Absolute: 0 10*3/uL (ref 0.0–0.1)
Basophils Relative: 0 %
Eosinophils Absolute: 0 10*3/uL (ref 0.0–0.5)
Eosinophils Relative: 0 %
HCT: 46.8 % — ABNORMAL HIGH (ref 36.0–46.0)
Hemoglobin: 15.3 g/dL — ABNORMAL HIGH (ref 12.0–15.0)
Immature Granulocytes: 0 %
Lymphocytes Relative: 9 %
Lymphs Abs: 1.3 10*3/uL (ref 0.7–4.0)
MCH: 30.1 pg (ref 26.0–34.0)
MCHC: 32.7 g/dL (ref 30.0–36.0)
MCV: 91.9 fL (ref 80.0–100.0)
Monocytes Absolute: 1.3 10*3/uL — ABNORMAL HIGH (ref 0.1–1.0)
Monocytes Relative: 9 %
Neutro Abs: 12.4 10*3/uL — ABNORMAL HIGH (ref 1.7–7.7)
Neutrophils Relative %: 82 %
Platelets: 255 10*3/uL (ref 150–400)
RBC: 5.09 MIL/uL (ref 3.87–5.11)
RDW: 14.1 % (ref 11.5–15.5)
WBC: 15.1 10*3/uL — ABNORMAL HIGH (ref 4.0–10.5)
nRBC: 0 % (ref 0.0–0.2)

## 2022-07-30 LAB — LACTIC ACID, PLASMA
Lactic Acid, Venous: 2.5 mmol/L (ref 0.5–1.9)
Lactic Acid, Venous: 1.4 mmol/L (ref 0.5–1.9)

## 2022-07-30 LAB — BASIC METABOLIC PANEL
Anion gap: 13 (ref 5–15)
BUN: 16 mg/dL (ref 8–23)
CO2: 21 mmol/L — ABNORMAL LOW (ref 22–32)
Calcium: 9.3 mg/dL (ref 8.9–10.3)
Chloride: 108 mmol/L (ref 98–111)
Creatinine, Ser: 1.07 mg/dL — ABNORMAL HIGH (ref 0.44–1.00)
GFR, Estimated: 49 mL/min — ABNORMAL LOW (ref >60)
Glucose, Bld: 125 mg/dL — ABNORMAL HIGH (ref 70–99)
Potassium: 4 mmol/L (ref 3.5–5.1)
Sodium: 142 mmol/L (ref 135–145)

## 2022-07-30 LAB — HEPATIC FUNCTION PANEL
ALT: 36 U/L (ref 0–44)
AST: 44 U/L — ABNORMAL HIGH (ref 15–41)
Albumin: 3.4 g/dL — ABNORMAL LOW (ref 3.5–5.0)
Alkaline Phosphatase: 98 U/L (ref 38–126)
Bilirubin, Direct: 0.1 mg/dL (ref 0.0–0.2)
Indirect Bilirubin: 0.8 mg/dL (ref 0.3–0.9)
Total Bilirubin: 0.9 mg/dL (ref 0.3–1.2)
Total Protein: 6.5 g/dL (ref 6.5–8.1)

## 2022-07-30 LAB — URINALYSIS, ROUTINE W REFLEX MICROSCOPIC
Bilirubin Urine: NEGATIVE
Glucose, UA: NEGATIVE mg/dL
Hgb urine dipstick: NEGATIVE
Ketones, ur: 5 mg/dL — AB
Leukocytes,Ua: NEGATIVE
Nitrite: NEGATIVE
Protein, ur: NEGATIVE mg/dL
Specific Gravity, Urine: 1.009 (ref 1.005–1.030)
pH: 5 (ref 5.0–8.0)

## 2022-07-30 LAB — RESP PANEL BY RT-PCR (RSV, FLU A&B, COVID)  RVPGX2
Influenza A by PCR: NEGATIVE
Influenza B by PCR: NEGATIVE
Resp Syncytial Virus by PCR: NEGATIVE
SARS Coronavirus 2 by RT PCR: NEGATIVE

## 2022-07-30 LAB — TROPONIN I (HIGH SENSITIVITY)
Troponin I (High Sensitivity): 23 ng/L — ABNORMAL HIGH (ref <18)
Troponin I (High Sensitivity): 48 ng/L — ABNORMAL HIGH (ref <18)

## 2022-07-30 LAB — CK: Total CK: 103 U/L (ref 38–234)

## 2022-07-30 MED ORDER — LEVETIRACETAM 500 MG PO TABS
500.0000 mg | ORAL_TABLET | Freq: Two times a day (BID) | ORAL | Status: DC
  Administered 2022-07-31: 500 mg via ORAL
  Filled 2022-07-30: qty 1

## 2022-07-30 MED ORDER — SODIUM CHLORIDE 0.9 % IV BOLUS
1000.0000 mL | Freq: Once | INTRAVENOUS | Status: AC
  Administered 2022-07-30: 1000 mL via INTRAVENOUS

## 2022-07-30 MED ORDER — SODIUM CHLORIDE 0.9 % IV SOLN
1.5000 g | Freq: Four times a day (QID) | INTRAVENOUS | Status: DC
  Administered 2022-07-30 – 2022-07-31 (×4): 1.5 g via INTRAVENOUS
  Filled 2022-07-30 (×7): qty 4

## 2022-07-30 MED ORDER — SODIUM CHLORIDE 0.9 % IV SOLN
500.0000 mg | Freq: Once | INTRAVENOUS | Status: DC
  Filled 2022-07-30: qty 5

## 2022-07-30 MED ORDER — PRAVASTATIN SODIUM 40 MG PO TABS
80.0000 mg | ORAL_TABLET | Freq: Every day | ORAL | Status: DC
  Administered 2022-07-30: 80 mg via ORAL
  Filled 2022-07-30: qty 2

## 2022-07-30 MED ORDER — SODIUM CHLORIDE 0.9 % IV SOLN
1.0000 g | Freq: Once | INTRAVENOUS | Status: DC
  Filled 2022-07-30: qty 10

## 2022-07-30 MED ORDER — ENOXAPARIN SODIUM 30 MG/0.3ML IJ SOSY
30.0000 mg | PREFILLED_SYRINGE | INTRAMUSCULAR | Status: DC
  Administered 2022-07-30: 30 mg via SUBCUTANEOUS
  Filled 2022-07-30: qty 0.3

## 2022-07-30 MED ORDER — IOHEXOL 350 MG/ML SOLN
50.0000 mL | Freq: Once | INTRAVENOUS | Status: AC | PRN
  Administered 2022-07-30: 50 mL via INTRAVENOUS

## 2022-07-30 MED ORDER — ASPIRIN 81 MG PO TBEC
81.0000 mg | DELAYED_RELEASE_TABLET | Freq: Every day | ORAL | Status: DC
  Administered 2022-07-30 – 2022-07-31 (×2): 81 mg via ORAL
  Filled 2022-07-30 (×2): qty 1

## 2022-07-30 MED ORDER — LETROZOLE 2.5 MG PO TABS
2.5000 mg | ORAL_TABLET | Freq: Every day | ORAL | Status: DC
  Administered 2022-07-31: 2.5 mg via ORAL
  Filled 2022-07-30: qty 1

## 2022-07-30 NOTE — ED Provider Notes (Addendum)
[facility] Provider Note   CSN: [id number] Arrival date & time: 07/30/22  7169     History  Chief Complaint  Patient presents with   [name] is a 89 y.o. female.  Patient here after a suppose a fall just prior to arrival.  She is not on blood thinners.  Per EMS she has a history of CAD and CABG.  She has chest pain that she states repeatedly.  She can tell me her name and where she is.  There is may be some history of cognitive deterioration recently.  EMS states that she seemed to be may be complaining of some left hip pain but they were able to move her legs around without much discomfort.  No signs of head trauma.  They are not sure if she hit her head.  Overall patient has really no complaints except for that she keeps saying she is having chest pain.  She is unable to tell me if that is why she fell or that happened after her fall.  The history is provided by the patient.       Home Medications Prior to Admission medications   Medication Sig Start Date End Date Taking? Authorizing Provider  aspirin EC 81 MG tablet Take 1 tablet (81 mg total) by mouth daily. Swallow whole. 06/07/22 03/04/23  Oswald Hillock, MD  letrozole [facility]) 2.5 MG tablet Take 1 tablet (2.5 mg total) by mouth daily. 07/05/22   Nicholas Lose, MD  pravastatin (PRAVACHOL) 80 MG tablet Take 1 tablet (80 mg total) by mouth daily at 6 PM. 07/23/22   Croitoru, Mihai, MD      Allergies    Lipitor [atorvastatin] and Simvastatin    Review of Systems   Review of Systems  Physical Exam Updated Vital Signs BP 116/80   Pulse 94   Temp (!) 97.3 F (36.3 C) (Rectal)   Resp 15   SpO2 93%  Physical Exam Vitals and nursing note reviewed.  Constitutional:      General: She is not in acute distress.    Appearance: She is well-developed. She is not ill-appearing.  HENT:     Head: Normocephalic and atraumatic.     Nose: Nose normal.     Mouth/Throat:      Mouth: Mucous membranes are moist.  Eyes:     Extraocular Movements: Extraocular movements intact.     Conjunctiva/sclera: Conjunctivae normal.     Pupils: Pupils are equal, round, and reactive to light.  Cardiovascular:     Rate and Rhythm: Normal rate and regular rhythm.     Pulses: Normal pulses.     Heart sounds: Normal heart sounds. No murmur heard. Pulmonary:     Effort: Pulmonary effort is normal. No respiratory distress.     Breath sounds: Normal breath sounds.  Abdominal:     Palpations: Abdomen is soft.     Tenderness: There is no abdominal tenderness.  Musculoskeletal:        General: No swelling or tenderness. Normal range of motion.     Cervical back: Normal range of motion and neck supple.  Skin:    General: Skin is warm and dry.     Capillary Refill: Capillary refill takes less than 2 seconds.  Neurological:     General: No focal deficit present.     Mental Status: She is alert and oriented to person, place, and time.     Cranial  Nerves: No cranial nerve deficit.     Sensory: No sensory deficit.     Motor: No weakness.     Coordination: Coordination normal.  Psychiatric:        Mood and Affect: Mood normal.     ED Results / Procedures / Treatments   Labs (all labs ordered are listed, but only abnormal results are displayed) Labs Reviewed  CBC WITH DIFFERENTIAL/PLATELET - Abnormal; Notable for the following components:      Result Value   WBC 15.1 (*)    Hemoglobin 15.3 (*)    HCT 46.8 (*)    Neutro Abs 12.4 (*)    Monocytes Absolute 1.3 (*)    All other components within normal limits  BASIC METABOLIC PANEL - Abnormal; Notable for the following components:   CO2 21 (*)    Glucose, Bld 125 (*)    Creatinine, Ser 1.07 (*)    GFR, Estimated 49 (*)    All other components within normal limits  URINALYSIS, ROUTINE W REFLEX MICROSCOPIC - Abnormal; Notable for the following components:   Ketones, ur 5 (*)    All other components within normal limits   LACTIC ACID, PLASMA - Abnormal; Notable for the following components:   Lactic Acid, Venous 2.5 (*)    All other components within normal limits  TROPONIN I (HIGH SENSITIVITY) - Abnormal; Notable for the following components:   Troponin I (High Sensitivity) 23 (*)    All other components within normal limits  URINE CULTURE  CULTURE, BLOOD (ROUTINE X 2)  CULTURE, BLOOD (ROUTINE X 2)  RESP PANEL BY RT-PCR (RSV, FLU A&B, COVID)  [id number]  LACTIC ACID, PLASMA  HEPATIC FUNCTION PANEL  CK  TROPONIN I (HIGH SENSITIVITY)    EKG EKG Interpretation  Date/Time:  Friday July 30 2022 07:59:08 EST Ventricular Rate:  92 PR Interval:  164 QRS Duration: 83 QT Interval:  374 QTC Calculation: 463 R Axis:   31 Text Interpretation: Sinus rhythm unchanged from prior Confirmed by Lennice Sites ([id number]) on 07/30/2022 8:04:58 AM  Radiology CT Head Wo Contrast  Result Date: 07/30/2022 CLINICAL DATA:  Provided history: Head trauma, moderate/severe. Polytrauma, blunt. EXAM: CT HEAD WITHOUT CONTRAST CT CERVICAL SPINE WITHOUT CONTRAST TECHNIQUE: Multidetector CT imaging of the head and cervical spine was performed following the standard protocol without intravenous contrast. Multiplanar CT image reconstructions of the cervical spine were also generated. RADIATION DOSE REDUCTION: This exam was performed according to the departmental dose-optimization program which includes automated exposure control, adjustment of the mA and/or kV according to patient size and/or use of iterative reconstruction technique. COMPARISON:  Brain MRI 06/06/2022.  Brain MRI 06/05/2022. FINDINGS: CT HEAD FINDINGS Brain: Moderate generalized cerebral atrophy. Small masses along the bilateral frontal lobes and within the right aspect of the posterior fossa were better appreciated on the prior contrast-enhanced brain MRI of 06/06/2022. Advanced patchy and confluent hypoattenuation within the cerebral white matter, nonspecific but compatible  with chronic small vessel disease. Known chronic lacunar infarcts within the bilateral deep gray nuclei and within the right aspect of the pons. There is no acute intracranial hemorrhage. No demarcated cortical infarct. No extra-axial fluid collection. No midline shift. Vascular: No hyperdense vessel.  Atherosclerotic calcifications. Skull: No fracture or aggressive osseous lesion. Sinuses/Orbits: No mass or acute finding within the imaged orbits. Minimal mucosal thickening within the bilateral ethmoid air cells. CT CERVICAL SPINE FINDINGS Alignment: Straightening of the expected cervical lordosis. 2 mm grade 1 anterolisthesis at C3-C4 and C7-T1. Skull base and  vertebrae: The basion-dental and atlanto-dental intervals are maintained.No evidence of acute fracture to the cervical spine. Soft tissues and spinal canal: No prevertebral fluid or swelling. No visible canal hematoma. Disc levels: Cervical spondylosis with multilevel disc space narrowing, disc bulges/central disc protrusions, endplate spurring, uncovertebral hypertrophy and facet arthrosis. Disc space narrowing is greatest at C5-C6 and C6-C7 (moderate to advanced at these levels). No appreciable high-grade spinal canal stenosis. Multilevel bony neural foraminal narrowing. Ventral osteophytes at C5-C6 and C6-C7. Facet joint ankylosis on the right, and possibly also on the left, at C3-C4. Upper chest: Partially imaged right pleural effusion/pleural thickening. Interstitial edema within the imaged lung apices. IMPRESSION: CT head: 1. No acute posttraumatic intracranial findings. 2. Multiple small masses along the bilateral frontal lobes and within the right aspect of the posterior fossa, better appreciated on the prior contrast-enhanced brain MRI of 06/06/2022. Please refer to this prior examination for further description. 3. Parenchymal atrophy and chronic small vessel ischemic disease, as described. CT cervical spine: 1. No evidence of acute fracture to the  cervical spine. 2. 2 mm grade 1 anterolisthesis at C3-C4 and C7-T1. 3. Cervical spondylosis, as described. 4. Partially imaged right pleural effusion/pleural thickening. 5. Interstitial edema within the imaged lung apices. Electronically Signed   By: Kellie Simmering D.O.   On: 07/30/2022 09:14   CT Cervical Spine Wo Contrast  Result Date: 07/30/2022 CLINICAL DATA:  Provided history: Head trauma, moderate/severe. Polytrauma, blunt. EXAM: CT HEAD WITHOUT CONTRAST CT CERVICAL SPINE WITHOUT CONTRAST TECHNIQUE: Multidetector CT imaging of the head and cervical spine was performed following the standard protocol without intravenous contrast. Multiplanar CT image reconstructions of the cervical spine were also generated. RADIATION DOSE REDUCTION: This exam was performed according to the departmental dose-optimization program which includes automated exposure control, adjustment of the mA and/or kV according to patient size and/or use of iterative reconstruction technique. COMPARISON:  Brain MRI 06/06/2022.  Brain MRI 06/05/2022. FINDINGS: CT HEAD FINDINGS Brain: Moderate generalized cerebral atrophy. Small masses along the bilateral frontal lobes and within the right aspect of the posterior fossa were better appreciated on the prior contrast-enhanced brain MRI of 06/06/2022. Advanced patchy and confluent hypoattenuation within the cerebral white matter, nonspecific but compatible with chronic small vessel disease. Known chronic lacunar infarcts within the bilateral deep gray nuclei and within the right aspect of the pons. There is no acute intracranial hemorrhage. No demarcated cortical infarct. No extra-axial fluid collection. No midline shift. Vascular: No hyperdense vessel.  Atherosclerotic calcifications. Skull: No fracture or aggressive osseous lesion. Sinuses/Orbits: No mass or acute finding within the imaged orbits. Minimal mucosal thickening within the bilateral ethmoid air cells. CT CERVICAL SPINE FINDINGS  Alignment: Straightening of the expected cervical lordosis. 2 mm grade 1 anterolisthesis at C3-C4 and C7-T1. Skull base and vertebrae: The basion-dental and atlanto-dental intervals are maintained.No evidence of acute fracture to the cervical spine. Soft tissues and spinal canal: No prevertebral fluid or swelling. No visible canal hematoma. Disc levels: Cervical spondylosis with multilevel disc space narrowing, disc bulges/central disc protrusions, endplate spurring, uncovertebral hypertrophy and facet arthrosis. Disc space narrowing is greatest at C5-C6 and C6-C7 (moderate to advanced at these levels). No appreciable high-grade spinal canal stenosis. Multilevel bony neural foraminal narrowing. Ventral osteophytes at C5-C6 and C6-C7. Facet joint ankylosis on the right, and possibly also on the left, at C3-C4. Upper chest: Partially imaged right pleural effusion/pleural thickening. Interstitial edema within the imaged lung apices. IMPRESSION: CT head: 1. No acute posttraumatic intracranial findings. 2. Multiple small masses  along the bilateral frontal lobes and within the right aspect of the posterior fossa, better appreciated on the prior contrast-enhanced brain MRI of 06/06/2022. Please refer to this prior examination for further description. 3. Parenchymal atrophy and chronic small vessel ischemic disease, as described. CT cervical spine: 1. No evidence of acute fracture to the cervical spine. 2. 2 mm grade 1 anterolisthesis at C3-C4 and C7-T1. 3. Cervical spondylosis, as described. 4. Partially imaged right pleural effusion/pleural thickening. 5. Interstitial edema within the imaged lung apices. Electronically Signed   By: Kellie Simmering D.O.   On: 07/30/2022 09:14   DG Chest Portable 1 View  Result Date: 07/30/2022 CLINICAL DATA:  Provided history: Fall. LBP. EXAM: PORTABLE CHEST 1 VIEW COMPARISON:  Prior chest radiographs 06/14/2022 and earlier. FINDINGS: Prior median sternotomy/CABG. Heart size within normal  limits. Aortic atherosclerosis. Small-to-moderate right pleural effusion with associated right basilar atelectasis and/or airspace consolidation. Chronic right apical pleural thickening, unchanged. Suspected trace left pleural effusion. Prominence of the interstitial lung markings, suggesting interstitial edema. No evidence of pneumothorax. No acute bony abnormality identified. IMPRESSION: 1. Prominence of the interstitial lung markings, suggesting interstitial edema. 2. Small-to-moderate right pleural effusion with associated right basilar atelectasis and/or airspace consolidation 3. Suspected trace left pleural effusion. 4. Chronic right apical pleural thickening, unchanged. 5.  Aortic Atherosclerosis (ICD10-I70.0). Electronically Signed   By: Kellie Simmering D.O.   On: 07/30/2022 08:54   DG Pelvis 1-2 Views  Result Date: 07/30/2022 CLINICAL DATA:  Fall this morning, low back pain. EXAM: PELVIS - 1-2 VIEW COMPARISON:  None Available. FINDINGS: Osseous alignment is normal. No fracture line or displaced fracture fragment is seen. Mild degenerative changes at the bilateral hips. Moderate degenerative change within the lower lumbar spine, incompletely imaged. Visualized soft tissues about the pelvis are unremarkable. IMPRESSION: 1. No acute findings. No osseous fracture or dislocation seen. 2. Mild degenerative change at the bilateral hips. Electronically Signed   By: Franki Cabot M.D.   On: 07/30/2022 08:41    Procedures .Critical Care  Performed by: Lennice Sites, DO Authorized by: Lennice Sites, DO   Critical care provider statement:    Critical care time (minutes):  35   Critical care was necessary to treat or prevent imminent or life-threatening deterioration of the following conditions:  Sepsis   Critical care was time spent personally by me on the following activities:  Blood draw for specimens, development of treatment plan with patient or surrogate, discussions with consultants, evaluation of  patient's response to treatment, obtaining history from patient or surrogate, ordering and performing treatments and interventions, ordering and review of laboratory studies, ordering and review of radiographic studies, pulse oximetry, re-evaluation of patient's condition, review of old charts and discussions with primary provider     Medications Ordered in ED Medications  cefTRIAXone (ROCEPHIN) 1 g in sodium chloride 0.9 % 100 mL IVPB (has no administration in time range)  azithromycin (ZITHROMAX) 500 mg in sodium chloride 0.9 % 250 mL IVPB (has no administration in time range)  sodium chloride 0.9 % bolus 1,000 mL (0 mLs Intravenous Stopped 07/30/22 1023)    ED Course/ Medical Decision Making/ A&P                             Medical Decision Making Amount and/or Complexity of Data Reviewed Labs: ordered. Radiology: ordered.  Risk Decision regarding hospitalization.   [name] is here with fall.  History of CAD.  History  of aortic dissection that has healed.  Not on blood thinners.  Normal vitals.  No fever.  Well-appearing.  Lives at home with daughter.  Signs like there is some sort of cognitive impairment at baseline.  She can tell me her name and what state were living in but seems to struggle with questioning.  Per chart review it seems like she has had some cognitive deterioration recently.  Her cardiology note most recently states that things have been going well.  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  She has had a CABG in the past.  She states she is having chest pain but is unable to elaborate.  Not sure if this happened before after the fall.  Does not seem to be fully reproducible on exam.  She does not have any extremity tenderness or deformities.  No signs of head trauma.  Overall we will check CBC, BMP, troponin.  EKG shows sinus rhythm.  No ischemic changes.  Will get chest x-ray, pelvic x-ray, head CT and neck CT and  reevaluate.  Will talk with family when they arrive.  Per my review and interpretation the labs patient with leukocytosis of 15, urinalysis negative for infection.  Troponin mildly elevated at 23.  Per my further review interpretation there is no significant anemia or electrolyte abnormality.  CT of the head and neck unremarkable.  Stable metastasis.  Pelvic x-ray per my review and interpretation does not show any acute findings.  Chest x-ray per radiology report with small to moderate right pleural effusion associated with atelectasis or may be airspace consolidation.  Ultimately I do not really know why she fell.  Is possible that this was a syncopal event.  Given mildly elevated troponin and some other abnormalities I believe she warrants admission for observation for echocardiogram.  May be EEG. To admit to medicine.  Patient pending CT of the chest to further evaluate pleural effusion/PE workup.  Overall think she benefit from EEG, echocardiogram and further workup.  May need some goals of care planning as well.  Will start some broad-spectrum antibiotics as lactic acid mildly elevated.  She meets sepsis criteria but not convinced that she has a true source for infection.  Could be some other competing process.  Questionable pneumonia on chest x-ray.  This chart was dictated using voice recognition software.  Despite best efforts to proofread,  errors can occur which can change the documentation meaning.    Final Clinical Impression(s) / ED Diagnoses Final diagnoses:  Syncope and collapse  SIRS (systemic inflammatory response syndrome) [facility])    Rx / DC Orders ED Discharge Orders     None         Lennice Sites, DO 07/30/22 [place], Saryah Loper, DO 07/30/22 [id number]    Lennice Sites, DO 07/30/22 1045

## 2022-07-30 NOTE — Progress Notes (Signed)
Per chart review, attending has ordered imaging for PE r/o- will follow from a distance and return when appropriate/if time and schedule allow.  Deniece Ree PT DPT PN2

## 2022-07-30 NOTE — Plan of Care (Signed)

## 2022-07-30 NOTE — Progress Notes (Signed)
EEG attempted however IV team was in the room and occupational therapy was waiting as well. Will attempt later when schedule permits.

## 2022-07-30 NOTE — ED Notes (Signed)
Bair warmer removed per Provider order

## 2022-07-30 NOTE — Evaluation (Signed)
Occupational Therapy Evaluation Patient Details Name: [name] MRN: [id number] DOB: 1928-06-20 Today's Date: 07/30/2022   History of Present Illness 89 yo F who presented to the ED after a fall at home. All imaging negative for acute fracture, acute intracranial injury, and PE negative. PMH CAD, hx aortic dissection now healed, HLD, CABG   Clinical Impression   PTA, pt performing ADL with mod I and lived with family. Upon eval, pt requires up to min A for bed mobility and transfers. Pt requires up to min A for LB ADL as well. Min guard A with RW for functional mobility. Suspect pt will progress well. Pt presenting with decreased strength, balance, safety, and memory. Recommending HHOT to optimize safety and independence in ADL and IADL.      Recommendations for follow up therapy are one component of a multi-disciplinary discharge planning process, led by the attending physician.  Recommendations may be updated based on patient status, additional functional criteria and insurance authorization.   Follow Up Recommendations  Home health OT     Assistance Recommended at Discharge Intermittent Supervision/Assistance  Patient can return home with the following A little help with walking and/or transfers;A little help with bathing/dressing/bathroom;Assistance with cooking/housework;Direct supervision/assist for medications management;Direct supervision/assist for financial management;Help with stairs or ramp for entrance;Assist for transportation    Functional Status Assessment  Patient has had a recent decline in their functional status and demonstrates the ability to make significant improvements in function in a reasonable and predictable amount of time.  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) Restrictions Weight Bearing Restrictions: No      Mobility Bed Mobility Overal bed mobility: Needs Assistance Bed Mobility: Supine to Sit, Sit to Supine     Supine to sit: Min assist Sit to supine: Min guard   General bed mobility comments: Min A to boost trunk    Transfers Overall transfer level: Needs assistance Equipment used: Rolling walker (2 wheels) Transfers: Sit to/from Stand Sit to Stand: Min assist           General transfer comment: min A for rise      Balance Overall balance assessment: History of Falls, Needs assistance Sitting-balance support: No upper extremity supported, Feet supported Sitting balance-Leahy Scale: Good     Standing balance support: Bilateral upper extremity supported, Reliant on assistive device for balance Standing balance-Leahy Scale: Poor                             ADL either performed or assessed with clinical judgement   ADL Overall ADL's : Needs assistance/impaired Eating/Feeding: Independent   Grooming: Min guard;Standing   Upper Body Bathing: Set up;Sitting   Lower Body Bathing: Min guard;Sit to/from stand   Upper Body Dressing : Set up;Sitting   Lower Body Dressing: Sit to/from stand;Minimal assistance   Toilet Transfer: Minimal assistance;Ambulation;Rolling walker (2 wheels) Toilet Transfer Details (indicate cue type and reason): Min A for initial rise. Min guard A for mobility         Functional mobility during ADLs: Min guard;Rolling walker (2 wheels)       Vision Baseline Vision/History:  (diplopia at baseline) Ability to See in Adequate Light: 2 Moderately impaired Patient Visual  Report: Diplopia;No change from baseline Additional Comments: Do not believe pt to be having any new deficits     Perception     Praxis      Pertinent Vitals/Pain Pain Assessment Pain Assessment: No/denies pain     Hand Dominance      Extremity/Trunk Assessment Upper Extremity Assessment Upper Extremity Assessment: Overall WFL for tasks assessed (seems normal given age)   Lower Extremity Assessment Lower Extremity Assessment: Generalized weakness   Cervical / Trunk Assessment Cervical / Trunk Assessment: Kyphotic   Communication Communication Communication: No difficulties   Cognition Arousal/Alertness: Awake/alert Behavior During Therapy: WFL for tasks assessed/performed Overall Cognitive Status: Within Functional Limits for tasks assessed                                 General Comments: followed cues well without difficulty, did occasionally need extra cues and explanation/increased time, may be some recent cog deterioration as per MD notes but still seems functional. Daughter present and reporting pt was "foggy" this morning but that cognition during session was baseline. Difficulty with delayed memory recall and one error with counting backward     General Comments  daughter present    Exercises     Shoulder Instructions      Home Living Family/patient expects to be discharged to:: Private residence Living Arrangements: Children Available Help at Discharge: Family;Available 24 hours/day Type of Home: House Home Access: Stairs to enter [employer] of Steps: 1   Home Layout: One level     Bathroom Shower/Tub: Tub/shower unit;Walk-in shower   Bathroom Toilet: Standard     Home Equipment: Conservation officer, nature (2 wheels);Cane - single point;Shower seat          Prior Functioning/Environment Prior Level of Function : Independent/Modified Independent;Needs assist             Mobility Comments: uses cane and RW ADLs Comments: shared IADLs between daughter and daugthers; daughters provide transportation. Performs ADL independently        OT Problem List: Decreased strength;Decreased activity tolerance;Impaired balance (sitting and/or standing);Decreased safety  awareness;Decreased cognition      OT Treatment/Interventions: Therapeutic exercise;Self-care/ADL training;DME and/or AE instruction;Balance training;Patient/family education;Therapeutic activities;Visual/perceptual remediation/compensation    OT Goals(Current goals can be found in the care plan section) Acute Rehab OT Goals Patient Stated Goal: go home OT Goal Formulation: With patient/family Time For Goal Achievement: 08/13/22 Potential to Achieve Goals: Good  OT Frequency: Min 2X/week    Co-evaluation              AM-PAC OT "6 Clicks" Daily Activity     Outcome Measure Help from another person eating meals?: None Help from another person taking care of personal grooming?: A Little Help from another person toileting, which includes using toliet, bedpan, or urinal?: A Little Help from another person bathing (including washing, rinsing, drying)?: A Little Help from another person to put on and taking off regular upper body clothing?: A Little Help from another person to put on and taking off regular lower body clothing?: A Little 6 Click Score: 19   End of Session Equipment Utilized During Treatment: Gait belt Nurse Communication: Mobility status  Activity Tolerance: Patient tolerated treatment well Patient left: in bed;with call bell/phone within reach;with family/visitor present  OT Visit Diagnosis: Unsteadiness on feet (R26.81);History of falling (Z91.81);Muscle weakness (generalized) (M62.81);Low vision, both eyes (H54.2)  Time: 1638-1700 OT Time Calculation (min): 22 min Charges:  OT General Charges $OT Visit: 1 Visit OT Evaluation $OT Eval Low Complexity: 1 Low  Elder Cyphers, OTR/L [facility] Acute Rehabilitation Office: [phone]   Magnus Ivan 07/30/2022, 5:38 PM

## 2022-07-30 NOTE — ED Notes (Signed)
ED TO INPATIENT HANDOFF REPORT  ED Nurse Name and Phone #: Threasa Beards [phone]  S Name/Age/Gender [name] 89 y.o. female Room/Bed: 018C/018C  Code Status   Code Status: Full Code  Home/SNF/Other Home Patient oriented to: self, place, and time Is this baseline? Yes   Triage Complete: Triage complete  Chief Complaint Fall [W19.XXXA]  Triage Note Pt BIB EMS from home. Family reported pt fell while walking to the bathroom around 0600. Unsure of head injury. EMS noted pt was holding left hip/buttock area but no obvious swelling or injury noted. Pt does not recall falling but is c/o chest pain at this time.    Allergies Allergies  Allergen Reactions   Lipitor [Atorvastatin]    Simvastatin     Level of Care/Admitting Diagnosis ED Disposition     ED Disposition  Admit   Condition  --   Comment  Hospital Area: [facility] [[id number]]  Level of Care: Telemetry Medical [104]  May place patient in observation at [facility] or [facility] if equivalent level of care is available:: No  Covid Evaluation: Asymptomatic - no recent exposure (last 10 days) testing not required  Diagnosis: Fall [[id number]]  Admitting Physician: Velna Ochs [[id number]]  Attending Physician: Velna Ochs [[id number]]          B Medical/Surgery History Past Medical History:  Diagnosis Date   CAD (coronary artery disease)    Carotid atherosclerosis    Dyslipidemia    H/O aortic dissection    Type B   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   Past Surgical History:  Procedure Laterality Date   BREAST BIOPSY Right 06/29/2022   [place] RT BREAST BX W LOC DEV 1ST LESION IMG BX SPEC US GUIDE 06/29/2022 [facility]   CORONARY ANGIOPLASTY WITH STENT PLACEMENT  11/21/2007   stenting of the RCA followed by staged LAD intervention   CORONARY ARTERY BYPASS GRAFT  11/30/2007   LIMA to LAD,SVG to 1st and 2nd  diagonal arteries   NM MYOVIEW LTD  07/13/2010   normal     A IV Location/Drains/Wounds Patient Lines/Drains/Airways Status     Active Line/Drains/Airways     Name Placement date Placement time Site Days   Peripheral IV 07/30/22 20 G Right Antecubital 07/30/22  0820  Antecubital  less than 1            Intake/Output Last 24 hours No intake or output data in the 24 hours ending 07/30/22 1146  Labs/Imaging Results for orders placed or performed during the hospital encounter of 07/30/22 (from the past 48 hour(s))  CBC with Differential     Status: Abnormal   Collection Time: 07/30/22  8:20 AM  Result Value Ref Range   WBC 15.1 (H) 4.0 - 10.5 K/uL   RBC 5.09 3.87 - 5.11 MIL/uL   Hemoglobin 15.3 (H) 12.0 - 15.0 g/dL   HCT 46.8 (H) 36.0 - 46.0 %   MCV 91.9 80.0 - 100.0 fL   MCH 30.1 26.0 - 34.0 pg   MCHC 32.7 30.0 - 36.0 g/dL   RDW 14.1 11.5 - 15.5 %   Platelets 255 150 - 400 K/uL   nRBC 0.0 0.0 - 0.2 %   Neutrophils Relative % 82 %   Neutro Abs 12.4 (H) 1.7 - 7.7 K/uL   Lymphocytes Relative 9 %   Lymphs Abs 1.3 0.7 - 4.0  K/uL   Monocytes Relative 9 %   Monocytes Absolute 1.3 (H) 0.1 - 1.0 K/uL   Eosinophils Relative 0 %   Eosinophils Absolute 0.0 0.0 - 0.5 K/uL   Basophils Relative 0 %   Basophils Absolute 0.0 0.0 - 0.1 K/uL   Immature Granulocytes 0 %   Abs Immature Granulocytes 0.06 0.00 - 0.07 K/uL    Comment: Performed at [facility] [address], [place], [place] [address]  Basic metabolic panel     Status: Abnormal   Collection Time: 07/30/22  8:20 AM  Result Value Ref Range   Sodium 142 135 - 145 mmol/L   Potassium 4.0 3.5 - 5.1 mmol/L   Chloride 108 98 - 111 mmol/L   CO2 21 (L) 22 - 32 mmol/L   Glucose, Bld 125 (H) 70 - 99 mg/dL    Comment: Glucose reference range applies only to samples taken after fasting for at least 8 hours.   BUN 16 8 - 23 mg/dL   Creatinine, Ser 1.07 (H) 0.44 - 1.00 mg/dL   Calcium 9.3 8.9 - 10.3 mg/dL   GFR, Estimated  49 (L) >60 mL/min    Comment: (NOTE) Calculated using the CKD-EPI Creatinine Equation (2021)    Anion gap 13 5 - 15    Comment: Performed at [facility] [address] [place], [place] [address]  Troponin I (High Sensitivity)     Status: Abnormal   Collection Time: 07/30/22  8:20 AM  Result Value Ref Range   Troponin I (High Sensitivity) 23 (H) <18 ng/L    Comment: (NOTE) Elevated high sensitivity troponin I (hsTnI) values and significant  changes across serial measurements may suggest ACS but many other  chronic and acute conditions are known to elevate hsTnI results.  Refer to the "Links" section for chest pain algorithms and additional  guidance. Performed at [facility], [place] [address], [place], [place] [address]   Lactic acid, plasma     Status: Abnormal   Collection Time: 07/30/22  8:38 AM  Result Value Ref Range   Lactic Acid, Venous 2.5 (HH) 0.5 - 1.9 mmol/L    Comment: CRITICAL RESULT CALLED TO, READ BACK BY AND VERIFIED WITH M.Austyn Seier RN 1030 07/30/22 MCCORMICK K Performed at [facility], [place] [address], [place], [place] [address]   Urinalysis, Routine w reflex microscopic -Urine, Clean Catch     Status: Abnormal   Collection Time: 07/30/22  9:00 AM  Result Value Ref Range   Color, Urine YELLOW YELLOW   APPearance CLEAR CLEAR   Specific Gravity, Urine 1.009 1.005 - 1.030   pH 5.0 5.0 - 8.0   Glucose, UA NEGATIVE NEGATIVE mg/dL   Hgb urine dipstick NEGATIVE NEGATIVE   Bilirubin Urine NEGATIVE NEGATIVE   Ketones, ur 5 (A) NEGATIVE mg/dL   Protein, ur NEGATIVE NEGATIVE mg/dL   Nitrite NEGATIVE NEGATIVE   Leukocytes,Ua NEGATIVE NEGATIVE    Comment: Performed at [facility] [address], [place], [place] [address]  Resp panel by RT-PCR (RSV, Flu A&B, Covid) Anterior Nasal Swab     Status: None   Collection Time: 07/30/22  9:55 AM   Specimen: Anterior Nasal Swab  Result Value Ref Range   SARS Coronavirus 2 by RT PCR NEGATIVE NEGATIVE    Influenza A by PCR NEGATIVE NEGATIVE   Influenza B by PCR NEGATIVE NEGATIVE    Comment: (NOTE) The Xpert Xpress SARS-CoV-2/FLU/RSV plus assay is intended as an  aid in the diagnosis of influenza from Nasopharyngeal swab specimens and should not be used as a sole basis for treatment. Nasal washings and aspirates are unacceptable for Xpert Xpress SARS-CoV-2/FLU/RSV testing.  Fact Sheet for Patients: EntrepreneurPulse.com.au  Fact Sheet for Healthcare Providers: IncredibleEmployment.be  This test is not yet approved or cleared by the [place] FDA and has been authorized for detection and/or diagnosis of SARS-CoV-2 by FDA under an Emergency Use Authorization (EUA). This EUA will remain in effect (meaning this test can be used) for the duration of the COVID-19 declaration under Section 564(b)(1) of the Act, 21 U.S.C. section 360bbb-3(b)(1), unless the authorization is terminated or revoked.     Resp Syncytial Virus by PCR NEGATIVE NEGATIVE    Comment: (NOTE) Fact Sheet for Patients: EntrepreneurPulse.com.au  Fact Sheet for Healthcare Providers: IncredibleEmployment.be  This test is not yet approved or cleared by the [place] FDA and has been authorized for detection and/or diagnosis of SARS-CoV-2 by FDA under an Emergency Use Authorization (EUA). This EUA will remain in effect (meaning this test can be used) for the duration of the COVID-19 declaration under Section 564(b)(1) of the Act, 21 U.S.C. section 360bbb-3(b)(1), unless the authorization is terminated or revoked.  Performed at [facility], [place] [address] [place], [place] [address]    CT Head Wo Contrast  Result Date: 07/30/2022 CLINICAL DATA:  Provided history: Head trauma, moderate/severe. Polytrauma, blunt. EXAM: CT HEAD WITHOUT CONTRAST CT CERVICAL SPINE WITHOUT CONTRAST TECHNIQUE: Multidetector CT imaging of the head and  cervical spine was performed following the standard protocol without intravenous contrast. Multiplanar CT image reconstructions of the cervical spine were also generated. RADIATION DOSE REDUCTION: This exam was performed according to the departmental dose-optimization program which includes automated exposure control, adjustment of the mA and/or kV according to patient size and/or use of iterative reconstruction technique. COMPARISON:  Brain MRI 06/06/2022.  Brain MRI 06/05/2022. FINDINGS: CT HEAD FINDINGS Brain: Moderate generalized cerebral atrophy. Small masses along the bilateral frontal lobes and within the right aspect of the posterior fossa were better appreciated on the prior contrast-enhanced brain MRI of 06/06/2022. Advanced patchy and confluent hypoattenuation within the cerebral white matter, nonspecific but compatible with chronic small vessel disease. Known chronic lacunar infarcts within the bilateral deep gray nuclei and within the right aspect of the pons. There is no acute intracranial hemorrhage. No demarcated cortical infarct. No extra-axial fluid collection. No midline shift. Vascular: No hyperdense vessel.  Atherosclerotic calcifications. Skull: No fracture or aggressive osseous lesion. Sinuses/Orbits: No mass or acute finding within the imaged orbits. Minimal mucosal thickening within the bilateral ethmoid air cells. CT CERVICAL SPINE FINDINGS Alignment: Straightening of the expected cervical lordosis. 2 mm grade 1 anterolisthesis at C3-C4 and C7-T1. Skull base and vertebrae: The basion-dental and atlanto-dental intervals are maintained.No evidence of acute fracture to the cervical spine. Soft tissues and spinal canal: No prevertebral fluid or swelling. No visible canal hematoma. Disc levels: Cervical spondylosis with multilevel disc space narrowing, disc bulges/central disc protrusions, endplate spurring, uncovertebral hypertrophy and facet arthrosis. Disc space narrowing is greatest at C5-C6  and C6-C7 (moderate to advanced at these levels). No appreciable high-grade spinal canal stenosis. Multilevel bony neural foraminal narrowing. Ventral osteophytes at C5-C6 and C6-C7. Facet joint ankylosis on the right, and possibly also on the left, at C3-C4. Upper chest: Partially imaged right pleural effusion/pleural thickening. Interstitial edema within the imaged lung apices. IMPRESSION: CT head: 1. No acute posttraumatic intracranial findings. 2. Multiple small masses along the  bilateral frontal lobes and within the right aspect of the posterior fossa, better appreciated on the prior contrast-enhanced brain MRI of 06/06/2022. Please refer to this prior examination for further description. 3. Parenchymal atrophy and chronic small vessel ischemic disease, as described. CT cervical spine: 1. No evidence of acute fracture to the cervical spine. 2. 2 mm grade 1 anterolisthesis at C3-C4 and C7-T1. 3. Cervical spondylosis, as described. 4. Partially imaged right pleural effusion/pleural thickening. 5. Interstitial edema within the imaged lung apices. Electronically Signed   By: Kellie Simmering D.O.   On: 07/30/2022 09:14   CT Cervical Spine Wo Contrast  Result Date: 07/30/2022 CLINICAL DATA:  Provided history: Head trauma, moderate/severe. Polytrauma, blunt. EXAM: CT HEAD WITHOUT CONTRAST CT CERVICAL SPINE WITHOUT CONTRAST TECHNIQUE: Multidetector CT imaging of the head and cervical spine was performed following the standard protocol without intravenous contrast. Multiplanar CT image reconstructions of the cervical spine were also generated. RADIATION DOSE REDUCTION: This exam was performed according to the departmental dose-optimization program which includes automated exposure control, adjustment of the mA and/or kV according to patient size and/or use of iterative reconstruction technique. COMPARISON:  Brain MRI 06/06/2022.  Brain MRI 06/05/2022. FINDINGS: CT HEAD FINDINGS Brain: Moderate generalized cerebral  atrophy. Small masses along the bilateral frontal lobes and within the right aspect of the posterior fossa were better appreciated on the prior contrast-enhanced brain MRI of 06/06/2022. Advanced patchy and confluent hypoattenuation within the cerebral white matter, nonspecific but compatible with chronic small vessel disease. Known chronic lacunar infarcts within the bilateral deep gray nuclei and within the right aspect of the pons. There is no acute intracranial hemorrhage. No demarcated cortical infarct. No extra-axial fluid collection. No midline shift. Vascular: No hyperdense vessel.  Atherosclerotic calcifications. Skull: No fracture or aggressive osseous lesion. Sinuses/Orbits: No mass or acute finding within the imaged orbits. Minimal mucosal thickening within the bilateral ethmoid air cells. CT CERVICAL SPINE FINDINGS Alignment: Straightening of the expected cervical lordosis. 2 mm grade 1 anterolisthesis at C3-C4 and C7-T1. Skull base and vertebrae: The basion-dental and atlanto-dental intervals are maintained.No evidence of acute fracture to the cervical spine. Soft tissues and spinal canal: No prevertebral fluid or swelling. No visible canal hematoma. Disc levels: Cervical spondylosis with multilevel disc space narrowing, disc bulges/central disc protrusions, endplate spurring, uncovertebral hypertrophy and facet arthrosis. Disc space narrowing is greatest at C5-C6 and C6-C7 (moderate to advanced at these levels). No appreciable high-grade spinal canal stenosis. Multilevel bony neural foraminal narrowing. Ventral osteophytes at C5-C6 and C6-C7. Facet joint ankylosis on the right, and possibly also on the left, at C3-C4. Upper chest: Partially imaged right pleural effusion/pleural thickening. Interstitial edema within the imaged lung apices. IMPRESSION: CT head: 1. No acute posttraumatic intracranial findings. 2. Multiple small masses along the bilateral frontal lobes and within the right aspect of the  posterior fossa, better appreciated on the prior contrast-enhanced brain MRI of 06/06/2022. Please refer to this prior examination for further description. 3. Parenchymal atrophy and chronic small vessel ischemic disease, as described. CT cervical spine: 1. No evidence of acute fracture to the cervical spine. 2. 2 mm grade 1 anterolisthesis at C3-C4 and C7-T1. 3. Cervical spondylosis, as described. 4. Partially imaged right pleural effusion/pleural thickening. 5. Interstitial edema within the imaged lung apices. Electronically Signed   By: Kellie Simmering D.O.   On: 07/30/2022 09:14   DG Chest Portable 1 View  Result Date: 07/30/2022 CLINICAL DATA:  Provided history: Fall. LBP. EXAM: PORTABLE CHEST 1 VIEW COMPARISON:  Prior chest radiographs 06/14/2022 and earlier. FINDINGS: Prior median sternotomy/CABG. Heart size within normal limits. Aortic atherosclerosis. Small-to-moderate right pleural effusion with associated right basilar atelectasis and/or airspace consolidation. Chronic right apical pleural thickening, unchanged. Suspected trace left pleural effusion. Prominence of the interstitial lung markings, suggesting interstitial edema. No evidence of pneumothorax. No acute bony abnormality identified. IMPRESSION: 1. Prominence of the interstitial lung markings, suggesting interstitial edema. 2. Small-to-moderate right pleural effusion with associated right basilar atelectasis and/or airspace consolidation 3. Suspected trace left pleural effusion. 4. Chronic right apical pleural thickening, unchanged. 5.  Aortic Atherosclerosis (ICD10-I70.0). Electronically Signed   By: Kellie Simmering D.O.   On: 07/30/2022 08:54   DG Pelvis 1-2 Views  Result Date: 07/30/2022 CLINICAL DATA:  Fall this morning, low back pain. EXAM: PELVIS - 1-2 VIEW COMPARISON:  None Available. FINDINGS: Osseous alignment is normal. No fracture line or displaced fracture fragment is seen. Mild degenerative changes at the bilateral hips. Moderate  degenerative change within the lower lumbar spine, incompletely imaged. Visualized soft tissues about the pelvis are unremarkable. IMPRESSION: 1. No acute findings. No osseous fracture or dislocation seen. 2. Mild degenerative change at the bilateral hips. Electronically Signed   By: Franki Cabot M.D.   On: 07/30/2022 08:41    Pending Labs Unresulted Labs (From admission, onward)     Start     Ordered   07/30/22 1026  CK  Once,   URGENT        07/30/22 1025   07/30/22 0842  Blood culture (routine x 2)  BLOOD CULTURE X 2,   R      07/30/22 0841   07/30/22 0842  Hepatic function panel  Once,   URGENT        07/30/22 0841   07/30/22 0838  Urine Culture (for pregnant, neutropenic or urologic patients or patients with an indwelling urinary catheter)  (Urine Labs)  Once,   URGENT       Question:  Indication  Answer:  Dysuria   07/30/22 0837   07/30/22 0838  Lactic acid, plasma  Now then every 2 hours,   R      07/30/22 0837            Vitals/Pain Today's Vitals   07/30/22 0836 07/30/22 0930 07/30/22 1009 07/30/22 1030  BP:  104/62  116/80  Pulse:  89  94  Resp:  18  15  Temp: (!) 94.3 F (34.6 C)  (!) 97.3 F (36.3 C)   TempSrc: Rectal  Rectal   SpO2:  93%  93%    Isolation Precautions No active isolations  Medications Medications  enoxaparin (LOVENOX) injection 30 mg (has no administration in time range)  sodium chloride 0.9 % bolus 1,000 mL (0 mLs Intravenous Stopped 07/30/22 1023)  iohexol (OMNIPAQUE) 350 MG/ML injection 50 mL (50 mLs Intravenous Contrast Given 07/30/22 1138)    Mobility walks with device     Focused Assessments Cardiac Assessment Handoff:    Lab Results  Component Value Date   CKTOTAL 131 11/20/2007   CKMB 4.0 11/20/2007   TROPONINI (H) 11/20/2007    0.29        PERSISTENTLY INCREASED TROPONIN VALUES IN THE RANGE OF 0.06-0.49 ng/mL CAN BE SEEN IN:       -UNSTABLE ANGINA   Lab Results  Component Value Date   DDIMER 2.14 (H) 06/03/2022    Does the Patient currently have chest pain? No   , Neuro Assessment Handoff:  Swallow screen pass?  In process          Neuro Assessment:   Neuro Checks:      Has TPA been given? No If patient is a Neuro Trauma and patient is going to OR before floor call report to [place] nurse: [phone] or [phone]   R Recommendations: See Admitting Provider Note  Report given to:   Additional Notes: intermittent confusion

## 2022-07-30 NOTE — H&P (Cosign Needed)
Date: 07/30/2022               Patient Name:  [name] MRN: [id number]  DOB: 03/30/1928 Age / Sex: 89 y.o., female   PCP: Pcp, No         Medical Service: Internal Medicine Teaching Service         Attending Physician: Dr. Velna Ochs, MD      First Contact: Dr. Iona Coach, MD Pager [phone]    Second Contact: Dr. Farrel Gordon, DO Pager [phone]         After Hours (After 5p/  First Contact Pager: [phone]  weekends / holidays): Second Contact Pager: [phone]   SUBJECTIVE   Chief Complaint: Found down  History of Present Illness:   Ms. Aten is a 89 y/o person living with a history of right breast invasive ductal carcinoma ER/PR +, HER2 -, CAD s/p CABG 2009, malignant pleural effusion, hypercholesterolemia, type B aortic dissection who presented after being found down on the ground by her daughter. The patient was in her usual state of health yesterday evening, last known normal around 11pm. This morning the patient's daughter found her down in the living room leaning up against an air purifier at 6 am the following morning. She would open her eyes, but had a "glazed over face" and was not verbally responsive. She was gripping tightly to her daughter and appeared scared. Her back was wet and per the daughter smelt like urine. She laid her mother down and called EMS. It took around 10 minutes for her mother to return to baseline. Denies any fecal incontinence. Patient endorses some back pain, but no other symptoms per the daughter.   Patient and daughter deny any illness over the last week, she worked with physical therapy two days ago and had no significant changes. She has been eating/drinking well. Denies fever, chills, cough, shortness of breath, focal weakness, or difficulty urinating or passing her bowels. At this time patient does have some tightness in her center chest that is reproducible. She feels it moves into her neck and is reproducible as well.   Meds:  81 mg  aspirin daily 80 mg pravastatin daily 2.5 mg letrozole daily  Past Medical History CAD s/p bypass 2009 CVA Metastatic Invasive ductal carcinoma of right breast ER/PR +, HER2-  Right malignant pleural effusion HLD Type B aortic dissection  Past Surgical History:  Procedure Laterality Date   BREAST BIOPSY Right 06/29/2022   [place] RT BREAST BX W LOC DEV 1ST LESION IMG BX SPEC US GUIDE 06/29/2022 [facility]   CORONARY ANGIOPLASTY WITH STENT PLACEMENT  11/21/2007   stenting of the RCA followed by staged LAD intervention   CORONARY ARTERY BYPASS GRAFT  11/30/2007   LIMA to LAD,SVG to 1st and 2nd diagonal arteries   NM MYOVIEW LTD  07/13/2010   normal   Social:  Lives with daugther Beth 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 Uses walker for support PCP: No PCP Substances: Denies substance use  Family History:  Mother DM Hypercholesterolemia in the family 2 sons with diabetes 1 son with CAD and CABG and renal failure  Allergies: Allergies as of 07/30/2022 - Reviewed 07/30/2022  Allergen Reaction Noted   Lipitor [atorvastatin]  12/24/2012   Simvastatin  12/24/2012   Review of Systems: A complete ROS was negative except as per HPI.   OBJECTIVE:   Physical Exam: Blood pressure 104/62, pulse 89,  temperature (!) 97.3 F (36.3 C), temperature source Rectal, resp. rate 18, SpO2 93 %.  Constitutional: pleasant, no acute distress HENT: normocephalic atraumatic, mucous membranes moist Eyes: conjunctiva non-erythematous Neck: supple Cardiovascular: regular rate and rhythm, 2/6 right 2nd intercostal space mumur. No gallops or rubs. No other murmurs. Pulmonary/Chest: normal work of breathing on room air, right lung base decreased breath sounds. No rhonchi, wheezing, or rales. Abdominal: soft, non-tender, non-distended MSK: normal bulk and tone. No midline back tenderness. No  deformities. Neurological: alert & oriented to self, person, and place but not time or situation, 5/5 strength in bilateral upper and lower extremities.  Skin: warm and dry Psych: normal mood  Labs: CBC    Component Value Date/Time   WBC 15.1 (H) 07/30/2022 0820   RBC 5.09 07/30/2022 0820   HGB 15.3 (H) 07/30/2022 0820   HCT 46.8 (H) 07/30/2022 0820   PLT 255 07/30/2022 0820   MCV 91.9 07/30/2022 0820   MCH 30.1 07/30/2022 0820   MCHC 32.7 07/30/2022 0820   RDW 14.1 07/30/2022 0820   LYMPHSABS 1.3 07/30/2022 0820   MONOABS 1.3 (H) 07/30/2022 0820   EOSABS 0.0 07/30/2022 0820   BASOSABS 0.0 07/30/2022 0820     CMP     Component Value Date/Time   NA 142 07/30/2022 0820   K 4.0 07/30/2022 0820   CL 108 07/30/2022 0820   CO2 21 (L) 07/30/2022 0820   GLUCOSE 125 (H) 07/30/2022 0820   BUN 16 07/30/2022 0820   CREATININE 1.07 (H) 07/30/2022 0820   CREATININE 0.68 03/24/2016 1025   CALCIUM 9.3 07/30/2022 0820   PROT 6.5 06/05/2022 1047   ALBUMIN 3.1 (L) 06/05/2022 1047   AST 32 06/05/2022 1047   ALT 24 06/05/2022 1047   ALKPHOS 99 06/05/2022 1047   BILITOT 1.0 06/05/2022 1047   GFRNONAA 49 (L) 07/30/2022 0820   GFRAA  12/02/2007 0417    >60        The eGFR has been calculated using the MDRD equation. This calculation has not been validated in all clinical    Imaging:  Pelvic xray - no acute findings Chest xray - right pleural effusion, trace left pleural effusion. Atelectasis vs opacity of the right lower lung.  CT head w/o contrast  1. No acute posttraumatic intracranial findings. 2. Multiple small masses along the bilateral frontal lobes and within the right aspect of the posterior fossa, better appreciated on the prior contrast-enhanced brain MRI of 06/06/2022. Please refer to this prior examination for further description. 3. Parenchymal atrophy and chronic small vessel ischemic disease, as described.   CT cervical spine: 1. No evidence of acute fracture  to the cervical spine. 2. 2 mm grade 1 anterolisthesis at C3-C4 and C7-T1. 3. Cervical spondylosis, as described. 4. Partially imaged right pleural effusion/pleural thickening. 5. Interstitial edema within the imaged lung apices.  CTA chest  No definite evidence of pulmonary embolus.  Ill-defined low densities are noted in the visualized hepatic parenchyma concerning for metastatic disease. Further evaluation with MRI is recommended.   Stable mediastinal and right axillary adenopathy is noted compared to prior exam which may be inflammatory in etiology, but metastatic disease can not be excluded.   Bilateral pleural effusions are noted, right greater than left, with associated atelectasis or infiltrates.   Status post coronary bypass graft.  EKG: personally reviewed my interpretation is rate of 90 bpm with normal axis and intervals. Artifact present. Nonspecific ST wave changes in anterior leads Prior EKG 05/2022 without significant  changes  ASSESSMENT & PLAN:   Assessment & Plan by Problem: Principal Problem:   Fall  KUUIPO ANZALDO is a 89 y.o. person living with a history of right breast invasive ductal carcinoma ER/PR +, HER2 -, CAD s/p CABG 2009, malignant pleural effusion, hypercholesterolemia, type B aortic dissection who presented after being found down on the ground for unknown period of time and admitted for further evaluation of possible syncope vs seizure on hospital day 0  Fall Potential syncope Patient found down after unknown period of time. Was found covered in urine and confused, took 10 minutes for her to return to baseline. Patient initially confused about incident but goes on to say she felt dizzy and passed out. Has not fell poorly recently. Differential includes orthostatic hypotension with syncope, hypoxia with syncope, and possible seizure with metastatic cancer. Thankfully she is hemodynamically stable and without significant organ dysfunction. CK normal and  renal function at baseline, no evidence of rhabdomyolysis. She is currently at her baseline, but will check spot EEG. Imaging with evidence of possible right lower lobe opacity aspiration pneumonia vs worsening pleural effusion, will check ambulatory oxygen saturation and treat for community acquired aspiration pneumonia. EKG without significant changes from prior, will keep on telemetry and if any abnormality can obtain echocardiogram.  -Spot EEG and repeat brain MRI pending -high risk for seizure and unable to definitely rule out, will start on keppra -orthostatic vital signs pending -ambulatory O2 sats -treat pneumonia -cardiac monitoring  Metastatic right breast invasive ductal carcinoma Recent admission for pleural effusion with lymphadenopathy and a breast mass, underwent breast and axillary lymph node biopsy, both of which were positive for ER/PR positive and HER2 negative invasive ductal carcinoma of the right breast. Pleural fluid negative for malignant cells. Brain MRI 12/10 with 2 small lesions left frontal lobe concerning for metastatic disease. CTA chest today with ill defined low densities in hepatic parenchyma concerning for metastatic disease. Currently following with Dr. Lindi Adie and on palliative treatment with antiestrogen therapy and radiation oncology follow up for palliative brain radiation.  -continue letrozole 2.5 mg daily -repeat MRI brain pending -will need outpatient follow up for possible hepatic lesions, synthetic functio of liver intact and no hepatobiliary abnormalities on labs -continue letrozole 2.5 mg daily -follow up outpatient with oncology and radiation oncology  Right exudative pleural effusion likely malignant Concern for right lower lobe aspiration pneumonia Right sided pleural effusion found during hospitalization 06/03/22. Thoracentesis performed exudative in nature and negative for malignant cells. Underlying etiology likely metastatic breast cancer. Appears  to have re-accumulated, thankfully patient is not dyspneic nor hypoxic. Imaging is concerning for a right lower lobe opacity, with her coughing episodes while eating/drinking and syncope she is high risk for aspiration. WBC of 15, unclear if reactive from fall or infectious process. With her high risk for decompensation will treat with unasyn. Do not believe septic, lactic acid mildly elevated, resolved with IV fluids.  -if develops hypoxia would perform thoracentesis, otherwise follow up outpatient -treat w/ unasyn 1.5 mg q6h day 1/5 -follow blood cultures drawn in ED.   Possible dysphagia Daughter endorses patient coughing frequently with meals. Evaluated by speech therapy, some concern for esophageal dysfunction.  -modified barium swallow pending  Hx of CAD S/p CABG 2009 Hyperlipidemia CABG LIMA to LAD, SVG to 1st and 2nd diagonal arteries. EKG without significant changes, do not believe fall/potential syncope cardiac in etiology. Last saw her cardiologist Dr. Sallyanne Kuster 1/26, recommended continuing aspirin. LDL not at goal but with age,  metastatic malignancy and co-morbidities he did not believe aggressive treatment necessary. Elevated troponin in ED likely secondary to demand ischemia.  -continue aspirin and pravastatin  Ischemic CVA Prior imaging consistent with left basal ganglia infarct. Workup negative for cardio embolic etiology. No evidence of significant carotid artery disease. Likely secondary to uncontrolled hyperlipidemia. No focal deficit on exam and do not believe contributing to fall/syncope.  -continue aspirin and statin  Memory impairment Daughter notes patient has become increasingly forgetful. On exam she is alert and oriented to place and person but not time or situation.   Advanced planning Overall long term prognosis poor. When discussing code status, patient and daughter endorse patient's wishes are to pass at home if at all possible. If she were to decompensate, plan  would be to resuscitate with intention of her returning home to pass. Will need ongoing conversations, does not follow with a PCP and I believe she would benefit from palliative and MOST form discussions moving forward.    Diet: normal diet, MBBS pending VTE: Enoxaparin IVF: None,None Code: Full  Prior to Admission Living Arrangement: Home, living with daugther Anticipated Discharge Location: Home Barriers to Discharge: further evaluation for episode  Dispo: Admit patient to Observation with expected length of stay less than 2 midnights.  Signed: Riesa Pope, MD Internal Medicine Resident PGY-3  07/30/2022, 11:30 AM

## 2022-07-30 NOTE — Evaluation (Signed)
Clinical/Bedside Swallow Evaluation Patient Details  Name: [name] MRN: [id number] Date of Birth: Sep 09, 1927  Today's Date: 07/30/2022 Time: SLP Start Time (ACUTE ONLY): 1147 SLP Stop Time (ACUTE ONLY): 1205 SLP Time Calculation (min) (ACUTE ONLY): 18 min  Past Medical History:  Past Medical History:  Diagnosis Date   CAD (coronary artery disease)    Carotid atherosclerosis    Dyslipidemia    H/O aortic dissection    Type B   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   Past Surgical History:  Past Surgical History:  Procedure Laterality Date   BREAST BIOPSY Right 06/29/2022   [place] RT BREAST BX W LOC DEV 1ST LESION IMG BX SPEC US GUIDE 06/29/2022 [facility]   CORONARY ANGIOPLASTY WITH STENT PLACEMENT  11/21/2007   stenting of the RCA followed by staged LAD intervention   CORONARY ARTERY BYPASS GRAFT  11/30/2007   LIMA to LAD,SVG to 1st and 2nd diagonal arteries   NM MYOVIEW LTD  07/13/2010   normal   HPI:  [name] is a 89 y.o. female who presented to ED after a suppose a fall just prior to arrival. CXR 2/2: "1. Prominence of the interstitial lung markings, suggesting  interstitial edema.  2. Small-to-moderate right pleural effusion with associated right  basilar atelectasis and/or airspace consolidation  3. Suspected trace left pleural effusion.  4. Chronic right apical pleural thickening, unchanged."    Assessment / Plan / Recommendation  Clinical Impression  Pt presents with mild risk of aspiration.  Pt with one instance of dry cough following thin liquids. She denied feeling of water going the wrong way and stated it wouldn't go down.  This occurred only once over the course of multiple trials of large, serial sips of thin liquid.  Pt consumed 8oz of water by straw without any clinical s/s of aspiration prior to this single dry cough event.  Pt exhibited good oral clearance of solids and there  were no clinical s/s of aspiration with these textures.  Pt did request liquid was following regular solid on 2 of 2 trials.  Daughter reports coughing with food after the swallow.  Pt states she feels a fullness when this happens.  Endoreses that taking a drink can help. Pt's symptoms are seemingly esophageal in nature. Pt's chest imaging is possibly concerning for consolidation.  Spoke briefly with MD and instrumental testing is indicated to rule out aspiration.  MBS planned for later this date.    Recommend resuming regular texture diet with thin liquids pending results of MBSS.  SLP Visit Diagnosis: Dysphagia, unspecified (R13.10)    Aspiration Risk  Mild aspiration risk    Diet Recommendation Regular;Thin liquid   Liquid Administration via: Cup;Straw Medication Administration: Whole meds with liquid Compensations: Slow rate;Small sips/bites Postural Changes: Seated upright at 90 degrees;Remain upright for at least 30 minutes after po intake    Other  Recommendations Oral Care Recommendations: Oral care BID    Recommendations for follow up therapy are one component of a multi-disciplinary discharge planning process, led by the attending physician.  Recommendations may be updated based on patient status, additional functional criteria and insurance authorization.  Follow up Recommendations  (pending results of MBS)      Assistance Recommended at Discharge    Functional Status Assessment  (pending results of MBS)  Frequency and Duration  (pending results of MBS)  Prognosis Prognosis for Safe Diet Advancement:  (pending results of MBS)      Swallow Study   General Date of Onset: 07/30/22 HPI: [name] is a 89 y.o. female who presented to ED after a suppose a fall just prior to arrival. CXR 2/2: "1. Prominence of the interstitial lung markings, suggesting  interstitial edema.  2. Small-to-moderate right pleural effusion with associated right  basilar atelectasis  and/or airspace consolidation  3. Suspected trace left pleural effusion.  4. Chronic right apical pleural thickening, unchanged." Type of Study: Bedside Swallow Evaluation Diet Prior to this Study: NPO Temperature Spikes Noted: No History of Recent Intubation: No Behavior/Cognition: Alert;Cooperative;Pleasant mood Oral Cavity Assessment: Within Functional Limits Oral Care Completed by SLP: No Oral Cavity - Dentition: Adequate natural dentition Patient Positioning: Upright in bed Baseline Vocal Quality: Normal Volitional Cough: Strong Volitional Swallow: Able to elicit    Oral/Motor/Sensory Function Overall Oral Motor/Sensory Function: Within functional limits Facial ROM: Within Functional Limits Facial Symmetry: Within Functional Limits Lingual ROM: Within Functional Limits Lingual Symmetry: Within Functional Limits Lingual Strength: Within Functional Limits Velum: Within Functional Limits Mandible: Within Functional Limits   Ice Chips Ice chips: Not tested   Thin Liquid Thin Liquid: Impaired Presentation: Straw Pharyngeal  Phase Impairments: Cough - Immediate    Nectar Thick Nectar Thick Liquid: Not tested   Honey Thick Honey Thick Liquid: Not tested   Puree Puree: Within functional limits Presentation: Spoon   Solid     Solid: Within functional limits Presentation:  (SLP fed)      Celedonio Savage, [place], [place] Office: [phone] 07/30/2022,12:34 PM

## 2022-07-30 NOTE — ED Triage Notes (Signed)
Pt BIB EMS from home. Family reported pt fell while walking to the bathroom around 0600. Unsure of head injury. EMS noted pt was holding left hip/buttock area but no obvious swelling or injury noted. Pt does not recall falling but is c/o chest pain at this time.

## 2022-07-30 NOTE — ED Notes (Signed)
Bair hugger applied to pt.  

## 2022-07-30 NOTE — Evaluation (Signed)
Physical Therapy Evaluation Patient Details Name: [name] MRN: [id number] DOB: 04-08-1928 Today's Date: 07/30/2022  History of Present Illness  89 yo F who presented to the ED after a fall at home. All imaging negative for acute fracture, acute intracranial injury, and PE negative. PMH CAD, hx aortic dissection now healed, HLD, CABG  Clinical Impression  Pt received in bed, daughter present and assisted in providing history. Took orthostatics as follows:  Supine 113/66 HR 92  Sitting EOB 123/81 HR 98  Standing 116/70 HR 106  Standing x3 minutes 129/66 HR 105   Able to ambulate in room with RW without too much difficulty. Has good support at home- I think when medically ready she can return home with 24/7A and HHPT.    Recommendations for follow up therapy are one component of a multi-disciplinary discharge planning process, led by the attending physician.  Recommendations may be updated based on patient status, additional functional criteria and insurance authorization.  Follow Up Recommendations Home health PT      Assistance Recommended at Discharge Frequent or constant Supervision/Assistance  Patient can return home with the following  A little help with walking and/or transfers;Assistance with cooking/housework;Direct supervision/assist for medications management;Assist for transportation;A little help with bathing/dressing/bathroom;Direct supervision/assist for financial management;Help with stairs or ramp for entrance    Equipment Recommendations None recommended by PT  Recommendations for Other Services       Functional Status Assessment Patient has had a recent decline in their functional status and demonstrates the ability to make significant improvements in function in a reasonable and predictable amount of time.     Precautions / Restrictions Precautions Precautions: Fall;Other (comment) Precaution Comments: poor vision L eye Restrictions Weight Bearing  Restrictions: No      Mobility  Bed Mobility Overal bed mobility: Needs Assistance Bed Mobility: Supine to Sit, Sit to Supine     Supine to sit: Mod assist, HOB elevated Sit to supine: Mod assist, HOB elevated   General bed mobility comments: ModA to boost trunk to upright at EOB, then [place] for trunk and BLE management with return to bed    Transfers Overall transfer level: Needs assistance Equipment used: Rolling walker (2 wheels) Transfers: Sit to/from Stand Sit to Stand: Min guard           General transfer comment: min guard for safety, VC for sequencing/hand placement, assist for line management    Ambulation/Gait Ambulation/Gait assistance: Min guard Gait Distance (Feet): 20 Feet Assistive device: Rolling walker (2 wheels) Gait Pattern/deviations: Step-through pattern, Narrow base of support, Trunk flexed, Drifts right/left       General Gait Details: steady with RW, assist primarily for line management  Stairs            Wheelchair Mobility    Modified Rankin (Stroke Patients Only)       Balance Overall balance assessment: History of Falls, Needs assistance Sitting-balance support: No upper extremity supported, Feet supported Sitting balance-Leahy Scale: Good     Standing balance support: Bilateral upper extremity supported, Reliant on assistive device for balance Standing balance-Leahy Scale: Poor                               Pertinent Vitals/Pain Pain Assessment Pain Assessment: No/denies pain Pain Score: 0-No pain    Home Living Family/patient expects to be discharged to:: Private residence Living Arrangements: Children Available Help at Discharge: Family;Available 24 hours/day Type of Home: House  Home Access: Stairs to enter   Entrance Stairs-Number of Steps: 1   Home Layout: One level Home Equipment: Conservation officer, nature (2 wheels);Cane - single point;Shower seat      Prior Function Prior Level of Function :  Independent/Modified Independent             Mobility Comments: uses cane and RW ADLs Comments: shared IADLs between daughter and daugthers; daughters provide transportation     Hand Dominance        Extremity/Trunk Assessment   Upper Extremity Assessment Upper Extremity Assessment: Defer to OT evaluation    Lower Extremity Assessment Lower Extremity Assessment: Generalized weakness    Cervical / Trunk Assessment Cervical / Trunk Assessment: Kyphotic  Communication   Communication: No difficulties  Cognition Arousal/Alertness: Awake/alert Behavior During Therapy: WFL for tasks assessed/performed Overall Cognitive Status: Within Functional Limits for tasks assessed                                 General Comments: followed cues well without difficulty, did occasionally need extra cues and explanation/increased time, may be some recent cog deterioration as per MD notes but still seems functional        General Comments      Exercises     Assessment/Plan    PT Assessment Patient needs continued PT services  PT Problem List Decreased strength;Decreased balance;Decreased cognition;Decreased mobility;Decreased knowledge of use of DME;Decreased activity tolerance;Decreased coordination;Decreased safety awareness       PT Treatment Interventions DME instruction;Functional mobility training;Balance training;Patient/family education;Gait training;Therapeutic activities;Stair training;Neuromuscular re-education;Therapeutic exercise    PT Goals (Current goals can be found in the Care Plan section)  Acute Rehab PT Goals Patient Stated Goal: go home no more falls PT Goal Formulation: With patient/family Time For Goal Achievement: 08/13/22 Potential to Achieve Goals: Fair    Frequency Min 3X/week     Co-evaluation               AM-PAC PT "6 Clicks" Mobility  Outcome Measure Help needed turning from your back to your side while in a flat bed  without using bedrails?: A Little Help needed moving from lying on your back to sitting on the side of a flat bed without using bedrails?: A Lot Help needed moving to and from a bed to a chair (including a wheelchair)?: A Little Help needed standing up from a chair using your arms (e.g., wheelchair or bedside chair)?: A Little Help needed to walk in hospital room?: A Little Help needed climbing 3-5 steps with a railing? : A Lot 6 Click Score: 16    End of Session   Activity Tolerance: Patient tolerated treatment well Patient left: in bed;with call bell/phone within reach;with family/visitor present Nurse Communication: Mobility status;Other (comment) (orthostatics) PT Visit Diagnosis: Unsteadiness on feet (R26.81);Muscle weakness (generalized) (M62.81);History of falling (Z91.81)    Time: 3419-3790 PT Time Calculation (min) (ACUTE ONLY): 23 min   Charges:   PT Evaluation $PT Eval Low Complexity: 1 Low PT Treatments $Gait Training: 8-22 mins       Deniece Ree PT DPT PN2

## 2022-07-30 NOTE — Progress Notes (Signed)
Modified Barium Swallow Progress Note  Patient Details  Name: [name] MRN: [id number] Date of Birth: 12/12/1927  Today's Date: 07/30/2022  Modified Barium Swallow completed.  Full report located under Chart Review in the Imaging Section.  Brief recommendations include the following:  Clinical Impression  Pt's swallow appears to be overall functional during PO trials. Pt initiated swallow quickly and exhibited adequate ability to protect her airway. Trace amounts of oral residue were present across trials of thin liquids, nectar thick liquids, honey thick liquids, and purees; however, appeared to clear after an independent second swallow. When asked to swallow a pill with thin liquids, the pill appeared to stick on pt's tongue and was not able to be cleared with multiple sips of thin liquids, which could be indicative of reduced posterior propulsion. Pt cleared pill from oral cavity independently while fluoro was turned off. A prominent CP bar was noted, but did not appear to functionally affect her swallow. Due to pt's overall ability to independently clear residue and level of functional swallow, recommend regular diet with thin liquids and no SLP f/u necessary.   Swallow Evaluation Recommendations       SLP Diet Recommendations: Regular solids;Thin liquid   Liquid Administration via: Cup;Straw   Medication Administration: Whole meds with liquid   Supervision: Patient able to self feed   Compensations: Slow rate;Small sips/bites   Postural Changes: Remain semi-upright after after feeds/meals (Comment);Seated upright at 90 degrees   Oral Care Recommendations: Oral care BID       Fabio Asa., Student SLP 07/30/2022,3:15 PM

## 2022-07-30 NOTE — ED Notes (Signed)
Warm blankets applied on pt

## 2022-07-31 ENCOUNTER — Observation Stay (HOSPITAL_COMMUNITY)
Admit: 2022-07-31 | Discharge: 2022-07-31 | Disposition: A | Discharge status: Home / Self Care | Payer: Medicare Other | Attending: Internal Medicine | Admitting: Internal Medicine

## 2022-07-31 DIAGNOSIS — R55 Syncope and collapse: Secondary | ICD-10-CM

## 2022-07-31 DIAGNOSIS — R569 Unspecified convulsions: Secondary | ICD-10-CM

## 2022-07-31 LAB — BASIC METABOLIC PANEL
Anion gap: 8 (ref 5–15)
BUN: 8 mg/dL (ref 8–23)
CO2: 21 mmol/L — ABNORMAL LOW (ref 22–32)
Calcium: 8.2 mg/dL — ABNORMAL LOW (ref 8.9–10.3)
Chloride: 110 mmol/L (ref 98–111)
Creatinine, Ser: 0.72 mg/dL (ref 0.44–1.00)
GFR, Estimated: >60 mL/min (ref >60)
Glucose, Bld: 114 mg/dL — ABNORMAL HIGH (ref 70–99)
Potassium: 3.3 mmol/L — ABNORMAL LOW (ref 3.5–5.1)
Sodium: 139 mmol/L (ref 135–145)

## 2022-07-31 LAB — CBC WITH DIFFERENTIAL/PLATELET
Abs Immature Granulocytes: 0.02 10*3/uL (ref 0.00–0.07)
Basophils Absolute: 0 10*3/uL (ref 0.0–0.1)
Basophils Relative: 0 %
Eosinophils Absolute: 0.1 10*3/uL (ref 0.0–0.5)
Eosinophils Relative: 1 %
HCT: 36.2 % (ref 36.0–46.0)
Hemoglobin: 12.4 g/dL (ref 12.0–15.0)
Immature Granulocytes: 0 %
Lymphocytes Relative: 19 %
Lymphs Abs: 1.3 10*3/uL (ref 0.7–4.0)
MCH: 30.8 pg (ref 26.0–34.0)
MCHC: 34.3 g/dL (ref 30.0–36.0)
MCV: 89.8 fL (ref 80.0–100.0)
Monocytes Absolute: 0.9 10*3/uL (ref 0.1–1.0)
Monocytes Relative: 13 %
Neutro Abs: 4.6 10*3/uL (ref 1.7–7.7)
Neutrophils Relative %: 67 %
Platelets: 181 10*3/uL (ref 150–400)
RBC: 4.03 MIL/uL (ref 3.87–5.11)
RDW: 14.2 % (ref 11.5–15.5)
WBC: 6.9 10*3/uL (ref 4.0–10.5)
nRBC: 0 % (ref 0.0–0.2)

## 2022-07-31 LAB — URINE CULTURE: Culture: NO GROWTH

## 2022-07-31 LAB — MAGNESIUM: Magnesium: 2 mg/dL (ref 1.7–2.4)

## 2022-07-31 MED ORDER — POTASSIUM CHLORIDE CRYS ER 20 MEQ PO TBCR: 40.0000 meq | EXTENDED_RELEASE_TABLET | Freq: Two times a day (BID) | ORAL | Status: DC

## 2022-07-31 MED ORDER — POTASSIUM CHLORIDE CRYS ER 20 MEQ PO TBCR
40.0000 meq | EXTENDED_RELEASE_TABLET | Freq: Once | ORAL | Status: AC
  Administered 2022-07-31: 40 meq via ORAL
  Filled 2022-07-31: qty 2

## 2022-07-31 MED ORDER — ENOXAPARIN SODIUM 40 MG/0.4ML IJ SOSY
40.0000 mg | PREFILLED_SYRINGE | INTRAMUSCULAR | Status: DC
  Administered 2022-07-31: 40 mg via SUBCUTANEOUS
  Filled 2022-07-31: qty 0.4

## 2022-07-31 MED ORDER — LEVETIRACETAM 500 MG PO TABS: 500.0000 mg | ORAL_TABLET | Freq: Two times a day (BID) | ORAL | 2 refills | Status: DC

## 2022-07-31 MED ORDER — GADOBUTROL 1 MMOL/ML IV SOLN
5.5000 mL | Freq: Once | INTRAVENOUS | Status: AC | PRN
  Administered 2022-07-31: 5.5 mL via INTRAVENOUS

## 2022-07-31 NOTE — Hospital Course (Addendum)
Syncope versus seizure Patient presented to [facility] ED after her daughter found her on the ground, incontinent of urine, with confusion. CK was normal and renal function was at baseline on presentation. EKG showed NSR with nonspecific ST changes in anterior leads. X-ray pelvis showed no acute findings; x-ray chest showed R pleural effusion with trace L pleural effusion, concerning for atelectasis vs opacity of RLL; CT head showed no acute post-traumatic intracranial findings but did show multiple small masses along the bilateral frontal lobs and within the R aspect of the posterior fossa, and parenchymal atrophy and chronic small vessel disease; CT cervical spine showed no evidence of acute fracture, grade 1 anterolisthesis at C3-4 and C7-T1, cervical spondylosis; partially imaged R pleural effusion/pleural thickening, and interstitial edema within the imaged lung apices; CTA chest showed no definite evidence of PE, ill-defined low densities in visualized hepatic parenchyma concerning for metastatic disease, stable mediastinal and R axillary adenopathy, bilateral pleural effusions R>L; MRI brain showed no acute intracranial abnormality, findings of chronic small vessel ischemia and volume loss. She was started on keppra for possible seizure and admitted to our service. Orthostatic vital signs were obtained and negative. EEG was obtained and showed mild diffuse slowing indicative of global cerebral dysfunction but did not show epileptiform abnormalities. Overall concern for syncopal versus seizure as preceding event to Ms. Bingley being found on the ground. In the setting of known brain metastases and high risk of seizure, she was discharged on keppra 500 mg BID.   Metastatic right breast invasive ductal carcinoma Home letrozole was continued. CTA chest showed no definite evidence of PE, ill-defined low densities in visualized hepatic parenchyma concerning for metastatic disease, stable mediastinal and R axillary  adenopathy, bilateral pleural effusions R>L; MRI brain showed no acute intracranial abnormality, findings of chronic small vessel ischemia and volume loss.   Right exudative pleural effusion likely malignant Concern for right lower lobe aspiration pneumonia Patient received Unasyn while hospitalized due to concern for aspiration pneumonia in the setting of imaging that was concerning for a right lower lobe opacity. She did not require supplemental oxygen and had no further symptoms to suggest an infectious process; antibiotics were discontinued at discharge.   Possible dysphagia MBSS showed overall intact swallowing function with recommendation to continue with regular solids and thin liquid diet.   Hx CVA Hx of CAD S/p CABG 2009 Hyperlipidemia Troponin on presentation was elevated 23>48 but thought to be 2/2 demand. Home aspirin and pravastatin were continued.

## 2022-07-31 NOTE — TOC Transition Note (Signed)
Transition of Care [facility]) - CM/SW Discharge Note   Patient Details  Name: [name] MRN: [id number] Date of Birth: Jun 24, 1928  [facility]) CM/SW Contact:  Tom-Johnson, Renea Ee, RN Phone Number: 07/31/2022, 4:20 PM   Clinical Narrative:     Patient is scheduled for discharge today. Home health info on AVS. Family to transport at discharge. No further TOC needs noted.        Final next level of care: Home w Home Health Services Barriers to Discharge: Barriers Resolved   Patient Goals and CMS Choice CMS Medicare.gov Compare Post Acute Care list provided to:: Patient Choice offered to / list presented to : Patient, Adult Children  Discharge Placement                  Patient to be transferred to facility by: Family      Discharge Plan and Services Additional resources added to the After Visit Summary for                  DME Arranged: N/A DME Agency: NA       HH Arranged: PT, OT HH Agency: [place] Date [place]: 07/31/22 Time [place]: 7741 Representative spoke with at [place]: [place] Determinants of Health ([place]) Interventions SDOH Screenings   Food Insecurity: No Food Insecurity (07/30/2022)  Housing: Low Risk  (07/30/2022)  Transportation Needs: No Transportation Needs (07/30/2022)  Utilities: Not At Risk (07/30/2022)  Tobacco Use: Low Risk  (07/30/2022)     Readmission Risk Interventions     No data to display

## 2022-07-31 NOTE — Discharge Summary (Signed)
Name: [name] MRN: [id number] DOB: 05/26/1928 89 y.o. PCP: Pcp, No  Date of Admission: 07/30/2022  7:55 AM Date of Discharge:  07/31/2022 Attending Physician: Dr. Philipp Ovens  DISCHARGE DIAGNOSIS:  Primary Problem: [facility] Problems: Principal Problem:   Fall Active Problems:   Malignant pleural effusion   Cancer of right breast metastatic to brain [facility])   Seizure ([place])   Syncope    DISCHARGE MEDICATIONS:   Allergies as of 07/31/2022       Reactions   Lipitor [atorvastatin]    Simvastatin         Medication List     TAKE these medications    aspirin EC 81 MG tablet Take 1 tablet (81 mg total) by mouth daily. Swallow whole.   letrozole 2.5 MG tablet Commonly known as: FEMARA Take 1 tablet (2.5 mg total) by mouth daily.   levETIRAcetam 500 MG tablet Commonly known as: KEPPRA Take 1 tablet (500 mg total) by mouth 2 (two) times daily.   MULTIVITAMIN PO Take 1 tablet by mouth daily.   pravastatin 80 MG tablet Commonly known as: PRAVACHOL Take 1 tablet (80 mg total) by mouth daily at 6 PM.        DISPOSITION AND FOLLOW-UP:  [name] was [facility] from [facility] in Stable condition. At the hospital follow up visit please address:  Syncope versus seizure Recommend ongoing conversations regarding keppra therapy with PCP and oncologist.   Concern for new hepatic lesions Recommend further review of imaging obtained during this hospitalization and ongoing goals of care discussions.  Follow-up Recommendations: Consults: None Labs: Basic Metabolic Profile Studies: Consider MRI of abdomen to further assess liver for metastatic disease.  Medications:   NEW at discharge: Keppra 500 mg BID No other medication changes.  Follow-up Appointments: With PCP and oncology team in 1-2 weeks.  HOSPITAL COURSE:  Patient Summary: Syncope versus seizure Patient presented to [facility] ED after her daughter found her on the ground,  incontinent of urine, with confusion. CK was normal and renal function was at baseline on presentation. EKG showed NSR with nonspecific ST changes in anterior leads. X-ray pelvis showed no acute findings; x-ray chest showed R pleural effusion with trace L pleural effusion, concerning for atelectasis vs opacity of RLL; CT head showed no acute post-traumatic intracranial findings but did show multiple small masses along the bilateral frontal lobs and within the R aspect of the posterior fossa, and parenchymal atrophy and chronic small vessel disease; CT cervical spine showed no evidence of acute fracture, grade 1 anterolisthesis at C3-4 and C7-T1, cervical spondylosis; partially imaged R pleural effusion/pleural thickening, and interstitial edema within the imaged lung apices; CTA chest showed no definite evidence of PE, ill-defined low densities in visualized hepatic parenchyma concerning for metastatic disease, stable mediastinal and R axillary adenopathy, bilateral pleural effusions R>L; MRI brain showed no acute intracranial abnormality, findings of chronic small vessel ischemia and volume loss. She was started on keppra for possible seizure and admitted to our service. Orthostatic vital signs were obtained and negative. EEG was obtained and showed mild diffuse slowing indicative of global cerebral dysfunction but did not show epileptiform abnormalities. Overall concern for syncopal versus seizure as preceding event to [name] being found on the ground. In the setting of known brain metastases and high risk of seizure, she was discharged on keppra 500 mg BID.   Metastatic right breast invasive ductal carcinoma Home letrozole was continued. CTA chest showed no definite  evidence of PE, ill-defined low densities in visualized hepatic parenchyma concerning for metastatic disease, stable mediastinal and R axillary adenopathy, bilateral pleural effusions R>L; MRI brain showed no acute intracranial abnormality,  findings of chronic small vessel ischemia and volume loss.   Right exudative pleural effusion likely malignant Concern for right lower lobe aspiration pneumonia Patient received Unasyn while hospitalized due to concern for aspiration pneumonia in the setting of imaging that was concerning for a right lower lobe opacity. She did not require supplemental oxygen and had no further symptoms to suggest an infectious process; antibiotics were discontinued at discharge.   Possible dysphagia MBSS showed overall intact swallowing function with recommendation to continue with regular solids and thin liquid diet.   Hx CVA Hx of CAD S/p CABG 2009 Hyperlipidemia Troponin on presentation was elevated 23>48 but thought to be 2/2 demand. Home aspirin and pravastatin were continued.   DISCHARGE INSTRUCTIONS:   Discharge Instructions     Discharge instructions   Complete by: As directed    [name],  It was a pleasure to care for you during your stay at [facility] were admitted after a fall and we are worried this could have been due to a seizure. To be safe, you are being started on Keppra which is an anti-seizure medicine. You will take this twice daily. You can discuss ongoing need for this medication with your primary care doctor and oncology team.  My best, Dr. Marlou Sa   Increase activity slowly   Complete by: As directed        SUBJECTIVE:  Patient evaluated at bedside. Her only complaint is tenderness of the posterior calcaneous to touch. She does not remember falling or what she was doing prior to this episode. She is eager to go home. Discharge Vitals:   BP 113/71 (BP Location: Left Arm)   Pulse 89   Temp 98.2 F (36.8 C) (Oral)   Resp 18   Ht '5\' 5"'$  (1.651 m)   Wt 58.2 kg   SpO2 95%   BMI 21.35 kg/m   OBJECTIVE:  Constitutional:Appears comfortable, stated age. In no acute distress. Cardio:Regular rate and rhythm. No murmurs, rubs, or gallops. Pulm:Normal work of  breathing on room air. OJJ:[id number] for extremity edema. Tender to palpation over posterior calcaneus with slight bruising. Skin:Warm and dry. Neuro:Alert and oriented to self. No focal deficit noted. Psych:Pleasant mood and affect.   Pertinent Labs, Studies, and Procedures:     Latest Ref Rng & Units 07/31/2022    4:43 AM 07/30/2022    8:20 AM 06/05/2022   10:47 AM  CBC  WBC 4.0 - 10.5 K/uL 6.9  15.1  5.9   Hemoglobin 12.0 - 15.0 g/dL 12.4  15.3  14.6   Hematocrit 36.0 - 46.0 % 36.2  46.8  44.6   Platelets 150 - 400 K/uL 181  255  216        Latest Ref Rng & Units 07/31/2022    4:43 AM 07/30/2022   10:30 AM 07/30/2022    8:20 AM  CMP  Glucose 70 - 99 mg/dL 114   125   BUN 8 - 23 mg/dL 8   16   Creatinine 0.44 - 1.00 mg/dL 0.72   1.07   Sodium 135 - 145 mmol/L 139   142   Potassium 3.5 - 5.1 mmol/L 3.3   4.0   Chloride 98 - 111 mmol/L 110   108   CO2 22 - 32 mmol/L 21   21  Calcium 8.9 - 10.3 mg/dL 8.2   9.3   Total Protein 6.5 - 8.1 g/dL  6.5    Total Bilirubin 0.3 - 1.2 mg/dL  0.9    Alkaline Phos 38 - 126 U/L  98    AST 15 - 41 U/L  44    ALT 0 - 44 U/L  36      MR BRAIN W WO CONTRAST  Result Date: 07/31/2022 CLINICAL DATA:  Found down EXAM: MRI HEAD WITHOUT AND WITH CONTRAST TECHNIQUE: Multiplanar, multiecho pulse sequences of the brain and surrounding structures were obtained without and with intravenous contrast. CONTRAST:  5.22m GADAVIST GADOBUTROL 1 MMOL/ML IV SOLN COMPARISON:  06/05/2022 FINDINGS: Brain: No acute infarct, mass effect or extra-axial collection. No acute or chronic hemorrhage. There is confluent hyperintense T2-weighted signal within the white matter. Generalized volume loss. The midline structures are normal. Vascular: Major flow voids are preserved. Skull and upper cervical spine: Normal calvarium and skull base. Visualized upper cervical spine and soft tissues are normal. Sinuses/Orbits:No paranasal sinus fluid levels or advanced mucosal thickening. No  mastoid or middle ear effusion. Normal orbits. IMPRESSION: 1. No acute intracranial abnormality. 2. Findings of chronic small vessel ischemia and volume loss. Electronically Signed   By: KUlyses JarredM.D.   On: 07/31/2022 01:28     Signed: EFarrel Gordon D.O.  Internal Medicine Resident, PGY-2 [facility]  Pager: #[phone]

## 2022-07-31 NOTE — Progress Notes (Signed)
EEG completed, results pending. 

## 2022-07-31 NOTE — Care Management Obs Status (Signed)
[place] NOTIFICATION   Patient Details  Name: NEERA TENG MRN: [id number] Date of Birth: 16-Jul-1927   Medicare Observation Status Notification Given:  Yes    Tom-Johnson, Renea Ee, RN 07/31/2022, 4:24 PM

## 2022-07-31 NOTE — Progress Notes (Signed)
DISCHARGE NOTE HOME [name] to be discharged Home per MD order. Discussed prescriptions and follow up appointments with the patient and daughter. Prescriptions given to patient; medication list explained in detail. Patient and daughter verbalize understanding.  Skin clean, dry and intact without evidence of skin break down, no evidence of skin tears noted. IV catheter discontinued intact. Site without signs and symptoms of complications. Dressing and pressure applied. Pt denies pain at the site currently. No complaints noted.  Patient free of lines, drains, and wounds.   An After Visit Summary (AVS) was printed and given to the patient. Patient escorted via wheelchair, and discharged home via private auto.  Anastasio Auerbach, RN

## 2022-07-31 NOTE — Progress Notes (Signed)
Physical Therapy Treatment Patient Details Name: [name] MRN: [id number] DOB: 1927-07-06 Today's Date: 07/31/2022   History of Present Illness 89 yo F who presented to the ED after a fall at home. All imaging negative for acute fracture, acute intracranial injury, and PE negative. PMH CAD, hx aortic dissection now healed, HLD, CABG    PT Comments    Great progress towards acute PT goals. Daughter present and encouraged by patient's improvement in mobility, getting back towards baseline. Require some light min assist for bed mobility, min guard to transfer, and min guard to ambulate with cues for RW placement. Tolerated LE exercises well. In recliner with daughter in room. Eager to return home. Adequate for d/c from PT standpoint once medically cleared. Will have full family support as needed after d/c. Will continue to follow and progress during admission.    Recommendations for follow up therapy are one component of a multi-disciplinary discharge planning process, led by the attending physician.  Recommendations may be updated based on patient status, additional functional criteria and insurance authorization.  Follow Up Recommendations  Home health PT     Assistance Recommended at Discharge Frequent or constant Supervision/Assistance  Patient can return home with the following A little help with walking and/or transfers;Assistance with cooking/housework;Direct supervision/assist for medications management;Assist for transportation;A little help with bathing/dressing/bathroom;Direct supervision/assist for financial management;Help with stairs or ramp for entrance   Equipment Recommendations  None recommended by PT    Recommendations for Other Services       Precautions / Restrictions Precautions Precautions: Fall;Other (comment) Precaution Comments: poor vision L eye; 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 Cues for technique    Transfers Overall transfer level: Needs assistance Equipment used: Rolling walker (2 wheels) Transfers: Sit to/from Stand Sit to Stand: Min guard           General transfer comment: min guard for safety, slightly unstable upon rising but improves with UE support on RW.    Ambulation/Gait Ambulation/Gait assistance: Min guard Gait Distance (Feet): 65 Feet Assistive device: Rolling walker (2 wheels) Gait Pattern/deviations: Step-through pattern, Narrow base of support, Trunk flexed, Drifts right/left, Decreased stride length Gait velocity: decr Gait velocity interpretation: <1.8 ft/sec, indicate of risk for recurrent falls   General Gait Details: Educated on safe AD use with RW for support. Intermittent cues for alignment within BOS due to drifting outside of RW at times. No overt buckling noted. A little erratic with control of RW. Cues throughout and min guard for safety.   Stairs             Wheelchair Mobility    Modified Rankin (Stroke Patients Only)       Balance Overall balance assessment: History of Falls, Needs assistance Sitting-balance support: No upper extremity supported, Feet supported Sitting balance-Leahy Scale: Good     Standing balance support: Bilateral upper extremity supported, Reliant on assistive device for balance Standing balance-Leahy Scale: Poor                              Cognition Arousal/Alertness: Awake/alert Behavior During Therapy: WFL for tasks assessed/performed  Overall Cognitive Status: Within Functional Limits for tasks assessed                                 General Comments: Minor cues for direction        Exercises General  Exercises - Lower Extremity Ankle Circles/Pumps: AROM, Both, 15 reps, Seated Quad Sets: Strengthening, Both, 15 reps, Seated Gluteal Sets: Strengthening, Both, 10 reps, Seated Long Arc Quad: Strengthening, Both, 10 reps, Seated    General Comments General comments (skin integrity, edema, etc.): Daughter present, encouraged by progress.      Pertinent Vitals/Pain Pain Assessment Pain Assessment: No/denies pain    Home Living                          Prior Function            PT Goals (current goals can now be found in the care plan section) Acute Rehab PT Goals Patient Stated Goal: go home no more falls PT Goal Formulation: With patient/family Time For Goal Achievement: 08/13/22 Potential to Achieve Goals: Fair Progress towards PT goals: Progressing toward goals    Frequency    Min 3X/week      PT Plan Current plan remains appropriate    Co-evaluation              AM-PAC PT "6 Clicks" Mobility   Outcome Measure  Help needed turning from your back to your side while in a flat bed without using bedrails?: A Little Help needed moving from lying on your back to sitting on the side of a flat bed without using bedrails?: A Little Help needed moving to and from a bed to a chair (including a wheelchair)?: A Little Help needed standing up from a chair using your arms (e.g., wheelchair or bedside chair)?: A Little Help needed to walk in hospital room?: A Little Help needed climbing 3-5 steps with a railing? : A Lot 6 Click Score: 17    End of Session Equipment Utilized During Treatment: Gait belt Activity Tolerance: Patient tolerated treatment well Patient left: with call bell/phone within reach;with family/visitor present;in chair Nurse Communication: Mobility status PT Visit Diagnosis: Unsteadiness on feet (R26.81);Muscle weakness (generalized) (M62.81);History of falling (Z91.81)     Time: 0350-0938 PT Time Calculation (min) (ACUTE ONLY): 15  min  Charges:  $Gait Training: 8-22 mins                     Candie Mile, PT, DPT Physical Therapist [facility] [place]    [name] 07/31/2022, 2:33 PM

## 2022-07-31 NOTE — Progress Notes (Signed)
PT Cancellation Note  Patient Details Name: [name] MRN: [id number] DOB: 1927/07/08   Cancelled Treatment:    Reason Eval/Treat Not Completed: Patient at procedure or test/unavailable Currently in room undergoing EEG testing.  Will follow up at a later time a schedule permits.  Ellouise Newer 07/31/2022, 9:07 AM

## 2022-07-31 NOTE — Procedures (Signed)
Routine EEG Report  [name] is a 89 y.o. female with a history of syncope who is undergoing an EEG to evaluate for seizures.  Report: This EEG was acquired with electrodes placed according to the International 10-20 electrode system (including Fp1, Fp2, F3, F4, C3, C4, P3, P4, O1, O2, T3, T4, T5, T6, A1, A2, Fz, Cz, Pz). The following electrodes were missing or displaced: none.  The occipital dominant rhythm was 7 Hz. This activity is reactive to stimulation. Drowsiness was manifested by background fragmentation; deeper stages of sleep were identified by K complexes and sleep spindles. There was no focal slowing. There were no interictal epileptiform discharges. There were no electrographic seizures identified. Photic stimulation and hyperventilation were not performed.   Impression and clinical correlation: This EEG was obtained while awake and asleep and is abnormal due to mild diffuse slowing indicative of global cerebral dysfunction. Epileptiform abnormalities were not seen during this recording.  Su Monks, MD [facility] [phone]  If 7pm- 7am, please page neurology on call as listed in [place].

## 2022-08-03 ENCOUNTER — Encounter: Payer: Self-pay | Admitting: *Deleted

## 2022-08-03 NOTE — Progress Notes (Signed)
Received call from [place] requesting verbal orders from MD to extend [facility] PT by 6 additional visits.  Verbal orders received from MD to continue.

## 2022-08-04 LAB — CULTURE, BLOOD (ROUTINE X 2)
Culture: NO GROWTH
Culture: NO GROWTH

## 2022-08-07 ENCOUNTER — Telehealth: Payer: Self-pay | Admitting: Student

## 2022-08-07 NOTE — Telephone Encounter (Signed)
Patient returned call to hospital. Patient's daughter reports since taking the keppra 551m twice daily the patient has experienced fatigue and intermittent dizziness. She reports once the medication wears off later in the day she tends to feel better. Over the last few days she has experienced increased dyspnea and today started feeling tightness in her chest. Discussed with patient's daughter that the dyspnea and chest pain are not typical side effects of Keppra and should be evaluated soon. She reportedly took her vital signs, with normal blood pressure and sating low 90's. No other symptoms. I encouraged her to have Ms. Detweiler evaluated as soon as possible for this, as it could be an alternative diagnosis. She is asking about decreasing the dose of medication. I encouraged her to continue 5010mdosage in the evenings and try 25049mablets in the morning and see if side effects improved. I have also encouraged her to follow-up with her primary care for this as soon as possible.  - Visit ED/urgent care for dyspnea/chest tightness - Decrease Keppra to 250m59m AM, 500mg23mPM - Follow-up with primary care physician for further assistance with Keppra dosing and side effects  PhillSanjuan DameInternal Medicine PGY-3 Pager: [phone]

## 2022-08-07 NOTE — Telephone Encounter (Signed)
Received page that Ms. Pua had called hospital requesting to speak to on-call team. Attempted to call back, unable to leave VM.  Sanjuan Dame, MD Internal Medicine PGY-3 Pager: [phone]

## 2022-08-31 ENCOUNTER — Ambulatory Visit (HOSPITAL_COMMUNITY)
Admission: RE | Admit: 2022-08-31 | Discharge: 2022-08-31 | Disposition: A | Discharge status: Home / Self Care | Payer: Medicare Other | Source: Ambulatory Visit | Attending: Hematology and Oncology | Admitting: Hematology and Oncology

## 2022-08-31 DIAGNOSIS — C7972 Secondary malignant neoplasm of left adrenal gland: Secondary | ICD-10-CM | POA: Diagnosis not present

## 2022-08-31 DIAGNOSIS — J910 Malignant pleural effusion: Secondary | ICD-10-CM | POA: Diagnosis present

## 2022-08-31 DIAGNOSIS — C787 Secondary malignant neoplasm of liver and intrahepatic bile duct: Secondary | ICD-10-CM | POA: Insufficient documentation

## 2022-08-31 DIAGNOSIS — C50911 Malignant neoplasm of unspecified site of right female breast: Secondary | ICD-10-CM | POA: Insufficient documentation

## 2022-08-31 DIAGNOSIS — N6310 Unspecified lump in the right breast, unspecified quadrant: Secondary | ICD-10-CM | POA: Insufficient documentation

## 2022-08-31 MED ORDER — SODIUM CHLORIDE (PF) 0.9 % IJ SOLN
INTRAMUSCULAR | Status: AC
  Filled 2022-08-31: qty 50

## 2022-08-31 MED ORDER — IOHEXOL 300 MG/ML  SOLN
80.0000 mL | Freq: Once | INTRAMUSCULAR | Status: AC | PRN
  Administered 2022-08-31: 80 mL via INTRAVENOUS

## 2022-08-31 NOTE — Progress Notes (Signed)
Patient Care Team: Yvonna Alanis, NP as PCP - General (Adult Health Nurse Practitioner) Nicholas Lose, MD as Consulting Physician (Hematology and Oncology) Mauro Kaufmann, RN as Oncology Nurse Navigator Rockwell Germany, RN as Oncology Nurse Navigator Delsa Sale, [place] (Optometry) Croitoru, Dani Gobble, MD as Consulting Physician (Cardiology) Nicholas Lose, MD as Consulting Physician (Hematology and Oncology) Allyn Kenner, MD (Dermatology)  DIAGNOSIS:  Encounter Diagnosis  Name Primary?   Malignant pleural effusion Yes      CHIEF COMPLIANT: Metastatic cancer/ discuss scans    INTERVAL HISTORY: [name] is a 89 y.o. female is here because of recent diagnosis of pleural effusion with lymphadenopathy and a breast mass. Currently on letrozole. She presents to the clinic for a follow-up to discuss scans. She reports that her health has been good. She denies any headaches or any other symptoms. Denies any pain in the belly.   ALLERGIES:  is allergic to lipitor [atorvastatin] and simvastatin.  MEDICATIONS:  Current Outpatient Medications  Medication Sig Dispense Refill   abemaciclib (VERZENIO) 50 MG tablet Take 1 tablet (50 mg total) by mouth 2 (two) times daily. 60 tablet 0   aspirin EC 81 MG tablet Take 1 tablet (81 mg total) by mouth daily. Swallow whole. 90 tablet 2   docusate sodium (COLACE) 100 MG capsule Take 100 mg by mouth every other day.     letrozole (FEMARA) 2.5 MG tablet Take 1 tablet (2.5 mg total) by mouth daily. 90 tablet 3   Multiple Vitamin (MULTIVITAMIN PO) Take 1 tablet by mouth daily.     pravastatin (PRAVACHOL) 80 MG tablet Take 1 tablet (80 mg total) by mouth daily at 6 PM. 90 tablet 3   Sennosides 15 MG TABS Take 1 tablet by mouth as needed.     No current facility-administered medications for this visit.    PHYSICAL EXAMINATION: ECOG PERFORMANCE STATUS: 1 - Symptomatic but completely ambulatory  Vitals:   09/03/22 1201  BP: (!) 141/87  Pulse: 97   Resp: 18  Temp: (!) 97.3 F (36.3 C)  SpO2: 98%   Filed Weights   09/03/22 1201  Weight: 124 lb 3.2 oz (56.3 kg)      LABORATORY DATA:  I have reviewed the data as listed    Latest Ref Rng & Units 07/31/2022    4:43 AM 07/30/2022   10:30 AM 07/30/2022    8:20 AM  CMP  Glucose 70 - 99 mg/dL 114   125   BUN 8 - 23 mg/dL 8   16   Creatinine 0.44 - 1.00 mg/dL 0.72   1.07   Sodium 135 - 145 mmol/L 139   142   Potassium 3.5 - 5.1 mmol/L 3.3   4.0   Chloride 98 - 111 mmol/L 110   108   CO2 22 - 32 mmol/L 21   21   Calcium 8.9 - 10.3 mg/dL 8.2   9.3   Total Protein 6.5 - 8.1 g/dL  6.5    Total Bilirubin 0.3 - 1.2 mg/dL  0.9    Alkaline Phos 38 - 126 U/L  98    AST 15 - 41 U/L  44    ALT 0 - 44 U/L  36      Lab Results  Component Value Date   WBC 6.9 07/31/2022   HGB 12.4 07/31/2022   HCT 36.2 07/31/2022   MCV 89.8 07/31/2022   PLT 181 07/31/2022   NEUTROABS 4.6 07/31/2022    ASSESSMENT &  PLAN:  Malignant pleural effusion CT CAP 06/03/2022: Large right pleural effusion, marked soft tissue thickening around the right hilum and right infrahilar region, mediastinal hilar and upper abdominal lymph nodes, right breast mass 5 cm   06/06/2022: Brain MRI: 2 small lesions left frontal lobe 3 mm and 4 mm concerning for metastatic disease, additional dural based contrast-enhancing lesion left frontal convexity possibly small meningioma, acute/subacute infarcts involving the left basal ganglia and right centrum semiovale   Hospitalization 06/03/2022-06/07/2022: Fatigue, shortness of breath Thoracentesis 06/04/2022: Negative for malignant cells U/S Right breast: 06/24/22: Rt breast mass: 6.3 cm, 3 LN axilla Rt Breast Biopsy: 06/29/22: Grade 2 IDC with extracellular mucin, lymph node positive for IDC ER 100%, PR 100%, Ki67 25%, HER2 0 negative Hospitalization 07/30/2022-07/31/2022 fall, urine incontinence     Treatment plan:  Palliative treatment with antiestrogen therapy with letrozole 2.5 mg  daily: Discussed role of adding Verzinio CT CAP 09/01/2022: Developing multiple hepatic metastases, multiple areas of abnormal lymph node enlargement mediastinum and hilar appears similar, left adrenal metastases, persistent bilateral pleural effusions  Patient is interested in trying Verzinio at a lower dose and see if she can tolerate it.  I sent a prescription for 50 mg p.o. twice daily. Return to clinic in 2 weeks to see Jenny Reichmann in pharmacy clinic in follow-up. If she is stopped self then we will consider referring to hospice care. I discussed with her that if untreated she may have less than 6 months to live.    No orders of the defined types were placed in this encounter.  The patient has a good understanding of the overall plan. she agrees with it. she will call with any problems that may develop before the next visit here. Total time spent: 30 mins including face to face time and time spent for planning, charting and co-ordination of care   Harriette Ohara, MD 09/03/22    I Gardiner Coins am acting as a Education administrator for [employer]  I have reviewed the above documentation for accuracy and completeness, and I agree with the above.

## 2022-09-02 ENCOUNTER — Ambulatory Visit (INDEPENDENT_AMBULATORY_CARE_PROVIDER_SITE_OTHER): Payer: Medicare Other | Admitting: Orthopedic Surgery

## 2022-09-02 ENCOUNTER — Encounter: Payer: Self-pay | Admitting: Orthopedic Surgery

## 2022-09-02 VITALS — BP 130/80 | HR 91 | Temp 97.1°F | Resp 16 | Ht 65.0 in | Wt 125.8 lb

## 2022-09-02 DIAGNOSIS — C50911 Malignant neoplasm of unspecified site of right female breast: Secondary | ICD-10-CM | POA: Diagnosis not present

## 2022-09-02 DIAGNOSIS — C7931 Secondary malignant neoplasm of brain: Secondary | ICD-10-CM

## 2022-09-02 DIAGNOSIS — R55 Syncope and collapse: Secondary | ICD-10-CM | POA: Diagnosis not present

## 2022-09-02 DIAGNOSIS — E782 Mixed hyperlipidemia: Secondary | ICD-10-CM

## 2022-09-02 DIAGNOSIS — R2681 Unsteadiness on feet: Secondary | ICD-10-CM

## 2022-09-02 DIAGNOSIS — I2510 Atherosclerotic heart disease of native coronary artery without angina pectoris: Secondary | ICD-10-CM

## 2022-09-02 DIAGNOSIS — J90 Pleural effusion, not elsewhere classified: Secondary | ICD-10-CM | POA: Diagnosis not present

## 2022-09-02 NOTE — Patient Instructions (Addendum)
[place] Fax # [phone]  Fasting labs next appointment> black coffee or water only

## 2022-09-02 NOTE — Progress Notes (Signed)
Careteam: Patient Care Team: Yvonna Alanis, NP as PCP - General (Adult Health Nurse Practitioner) Nicholas Lose, MD as Consulting Physician (Hematology and Oncology) Mauro Kaufmann, RN as Oncology Nurse Navigator Rockwell Germany, RN as Oncology Nurse Navigator Delsa Sale, [place] (Optometry) Croitoru, [place], MD as Consulting Physician (Cardiology) Nicholas Lose, MD as Consulting Physician (Hematology and Oncology) Allyn Kenner, MD (Dermatology)  Seen by: Windell Moulding, AGNP-C  PLACE OF SERVICE:  [place] Directive information Does Patient Have a Medical Advance Directive?: Yes, Type of Advance Directive: [place];Living will;Out of facility DNR (pink MOST or yellow form), Does patient want to make changes to medical advance directive?: No - Patient declined  Allergies  Allergen Reactions   Lipitor [Atorvastatin]    Simvastatin     Chief Complaint  Patient presents with   Establish Care    New Patient.      HPI: Patient is a 89 y.o. female seen today to establish at [facility].   Followed by Dr. Lindi Adie as primary specialist. From [place]. Widowed since 2016. 10 children. Lives with daughter in single family home. 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   02/02 hospitalized 02/02- 02/03 due to syncope. EEG unremarkable. EKG NSR with ST changes in anterior leads. CXR  right pleural effusion> 06/03/2022 thoracentesis negative for malignant cells. MRI brain no acute abnormalities, 2 small lesions to left frontal lobe concerning for metastatic disease, chronic small vessel ischemia and volume loss noted. She was started on Keppra 500 mg BID during hospitalization, but discontinued at discharge. She is followed by HHPT at this time. No recent falls or injuries.   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 Followed by Dr. Lindi Adie and palliative. Remains on Letrozole. See above. Referral to radiation  oncology due to concerns for brain metastasis. 03/05 CT CAP> concerns for multiple metastatic metastasis/ left adrenal metastasis  H/o acute anterior wall MI, s/p CABG 2009.   Never diagnosed with diabetes.    Family history:  Mother passed at age 87, T2DM, passed from old age  Father passed at age 85, MI  Brother passed at age 79, mastoid of ear  Brother passed at age 64, pacemaker, artifical valve  Sister passed at age 51, MI  Sister age 65, HLD  Smoking: never smoked Drinking: no past use or abuse Drug: no past use or abuse  Diet: does not drink soda, eats 2 meals with snack daily, trying to drink supplement drink daily Exercise:enrolled in PT  Eye exam: last exam 2023, double vision in left eye> wears patch normally Dental exam: no recent dental exam   Advanced directives/ HPOA completed.    Review of Systems:  Review of Systems  Constitutional:  Negative for fever.  HENT:  Negative for congestion and sore throat.   Eyes:  Positive for double vision.       Left eye patch  Respiratory:  Negative for cough, shortness of breath and wheezing.   Cardiovascular:  Negative for chest pain and leg swelling.  Gastrointestinal:  Negative for abdominal pain and blood in stool.  Genitourinary:  Negative for dysuria, frequency and hematuria.  Musculoskeletal:  Negative for falls and joint pain.  Skin:  Negative for rash.  Neurological:  Positive for weakness. Negative for dizziness and headaches.  Psychiatric/Behavioral:  Positive for memory loss. Negative for depression. The patient is not nervous/anxious and does not have insomnia.     Past Medical History:  Diagnosis Date   CAD (coronary artery disease)    Cancer ([place]) 05/2022   right breast biopsy, on oral chemotherapy drug   Carotid atherosclerosis    Dyslipidemia    H/O aortic dissection    Type B   High cholesterol    History of acute anterior wall MI 11/19/2007   Low blood pressure    S/P CABG x 3 11/30/2007    LIMA to LAD,SVG to 1st & 2nd diagonal arteries.   Past Surgical History:  Procedure Laterality Date   BREAST BIOPSY Right 06/29/2022   [place] RT BREAST BX W LOC DEV 1ST LESION IMG BX SPEC US GUIDE 06/29/2022 [facility] MAMMOGRAPHY   CORONARY ANGIOPLASTY WITH STENT PLACEMENT  11/21/2007   stenting of the RCA followed by staged LAD intervention   CORONARY ARTERY BYPASS GRAFT  11/30/2007   LIMA to LAD,SVG to 1st and 2nd diagonal arteries   NM MYOVIEW LTD  07/13/2010   normal   Social History:   reports that she has never smoked. She has never used smokeless tobacco. She reports that she does not drink alcohol and does not use drugs.  Family History  Problem Relation Age of Onset   Diabetes Mother    Heart failure Mother    Stroke Mother    Heart failure Father    Diabetes Son    Diabetes Son     Medications: Patient's Medications  New Prescriptions   No medications on file  Previous Medications   ASPIRIN EC 81 MG TABLET    Take 1 tablet (81 mg total) by mouth daily. Swallow whole.   DOCUSATE SODIUM (COLACE) 100 MG CAPSULE    Take 100 mg by mouth every other day.   LETROZOLE (FEMARA) 2.5 MG TABLET    Take 1 tablet (2.5 mg total) by mouth daily.   MULTIPLE VITAMIN (MULTIVITAMIN PO)    Take 1 tablet by mouth daily.   PRAVASTATIN (PRAVACHOL) 80 MG TABLET    Take 1 tablet (80 mg total) by mouth daily at 6 PM.   SENNOSIDES 15 MG TABS    Take 1 tablet by mouth as needed.  Modified Medications   No medications on file  Discontinued Medications   LEVETIRACETAM (KEPPRA) 500 MG TABLET    Take 1 tablet (500 mg total) by mouth 2 (two) times daily.    Physical Exam:  Vitals:   09/02/22 1353  BP: 130/80  Pulse: 91  Resp: 16  Temp: (!) 97.1 F (36.2 C)  SpO2: 90%  Weight: 125 lb 12.8 oz (57.1 kg)  Height: '5\' 5"'$  (1.651 m)   Body mass index is 20.93 kg/m. Wt Readings from Last 3 Encounters:  09/02/22 125 lb 12.8 oz (57.1 kg)  07/30/22 128 lb 4.9 oz (58.2 kg)  07/23/22 129 lb (58.5 kg)     Physical Exam Vitals reviewed.  Constitutional:      General: She is not in acute distress. HENT:     Head: Normocephalic.  Eyes:     General:        Right eye: No discharge.        Left eye: No discharge.  Neck:     Vascular: No carotid bruit.  Cardiovascular:     Rate and Rhythm: Normal rate and regular rhythm.     Pulses: Normal pulses.     Heart sounds: Murmur heard.  Pulmonary:     Effort: Pulmonary effort is normal. No respiratory distress.     Breath sounds: Normal breath sounds.  No wheezing.  Abdominal:     General: Bowel sounds are normal. There is no distension.     Palpations: Abdomen is soft.     Tenderness: There is no abdominal tenderness.  Musculoskeletal:     Cervical back: Neck supple.     Right lower leg: No edema.     Left lower leg: No edema.  Lymphadenopathy:     Cervical: No cervical adenopathy.  Skin:    General: Skin is warm and dry.     Capillary Refill: Capillary refill takes less than 2 seconds.  Neurological:     General: No focal deficit present.     Mental Status: She is alert and oriented to person, place, and time.     Motor: Weakness present.     Gait: Gait abnormal.     Comments: rolator  Psychiatric:        Mood and Affect: Mood normal.        Behavior: Behavior normal.     Labs reviewed: Basic Metabolic Panel: Recent Labs    06/05/22 1047 07/30/22 0820 07/31/22 0443  NA 140 142 139  K 3.9 4.0 3.3*  CL 110 108 110  CO2 20* 21* 21*  GLUCOSE 148* 125* 114*  BUN '10 16 8  '$ CREATININE 0.73 1.07* 0.72  CALCIUM 8.8* 9.3 8.2*  MG  --   --  2.0   Liver Function Tests: Recent Labs    06/05/22 1047 07/30/22 1030  AST 32 44*  ALT 24 36  ALKPHOS 99 98  BILITOT 1.0 0.9  PROT 6.5 6.5  ALBUMIN 3.1* 3.4*   No results for input(s): "LIPASE", "AMYLASE" in the last 8760 hours. No results for input(s): "AMMONIA" in the last 8760 hours. CBC: Recent Labs    06/05/22 1047 07/30/22 0820 07/31/22 0443  WBC 5.9 15.1* 6.9   NEUTROABS 3.9 12.4* 4.6  HGB 14.6 15.3* 12.4  HCT 44.6 46.8* 36.2  MCV 91.6 91.9 89.8  PLT 216 255 181   Lipid Panel: Recent Labs    06/06/22 0438  CHOL 279*  HDL 42  LDLCALC 217*  TRIG 101  CHOLHDL 6.6   TSH: No results for input(s): "TSH" in the last 8760 hours. A1C: Lab Results  Component Value Date   HGBA1C 6.4 (H) 06/06/2022     Assessment/Plan 1. 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 Pleural effusion on right - 06/04/2022 thoracentesis> negative for malignancy - lung sounds clear  3. Syncope and collapse - hospitalized 02/02- 02/03 - EEG unremarkable - EKG NSR with ST changes - no recent episodes - ? Dehydration - cont hydration with water  4. Coronary artery disease involving native coronary artery of native heart without angina pectoris - followed by cardiology - h/o MI s/p CABG 2009 - cont aspirin and statin  5. Mixed hyperlipidemia - total cholesterol 279, LDL 217 05/2022 - cont pravastatin  6. Unstable gait - ambulates with walker - cont HHPT - cont falls safety prevention  Total time: 47 minutes. Greater than 50% of total time spent doing patient education regarding health maintenance, syncope, diet, falls prevention, and chronic disease management including symptom/medication management.    Next appt: 09/30/2022  Windell Moulding, Rexford Maus  [place] [phone]

## 2022-09-03 ENCOUNTER — Inpatient Hospital Stay: Payer: Medicare Other | Attending: Hematology and Oncology | Admitting: Hematology and Oncology

## 2022-09-03 ENCOUNTER — Other Ambulatory Visit: Payer: Self-pay

## 2022-09-03 ENCOUNTER — Telehealth: Payer: Self-pay

## 2022-09-03 ENCOUNTER — Telehealth: Payer: Self-pay | Admitting: Pharmacy Technician

## 2022-09-03 ENCOUNTER — Other Ambulatory Visit (HOSPITAL_COMMUNITY): Payer: Self-pay

## 2022-09-03 VITALS — BP 141/87 | HR 97 | Temp 97.3°F | Resp 18 | Ht 65.0 in | Wt 124.2 lb

## 2022-09-03 DIAGNOSIS — N63 Unspecified lump in unspecified breast: Secondary | ICD-10-CM | POA: Diagnosis not present

## 2022-09-03 DIAGNOSIS — Z79811 Long term (current) use of aromatase inhibitors: Secondary | ICD-10-CM | POA: Insufficient documentation

## 2022-09-03 DIAGNOSIS — Z79899 Other long term (current) drug therapy: Secondary | ICD-10-CM | POA: Diagnosis not present

## 2022-09-03 DIAGNOSIS — C7931 Secondary malignant neoplasm of brain: Secondary | ICD-10-CM | POA: Insufficient documentation

## 2022-09-03 DIAGNOSIS — C801 Malignant (primary) neoplasm, unspecified: Secondary | ICD-10-CM | POA: Diagnosis present

## 2022-09-03 DIAGNOSIS — C787 Secondary malignant neoplasm of liver and intrahepatic bile duct: Secondary | ICD-10-CM | POA: Insufficient documentation

## 2022-09-03 DIAGNOSIS — Z7982 Long term (current) use of aspirin: Secondary | ICD-10-CM | POA: Diagnosis not present

## 2022-09-03 DIAGNOSIS — J910 Malignant pleural effusion: Secondary | ICD-10-CM | POA: Diagnosis not present

## 2022-09-03 MED ORDER — ABEMACICLIB 50 MG PO TABS
50.0000 mg | ORAL_TABLET | Freq: Two times a day (BID) | ORAL | 0 refills | Status: DC
  Filled 2022-09-03: qty 70, 35d supply, fill #0

## 2022-09-03 NOTE — Assessment & Plan Note (Signed)
CT CAP 06/03/2022: Large right pleural effusion, marked soft tissue thickening around the right hilum and right infrahilar region, mediastinal hilar and upper abdominal lymph nodes, right breast mass 5 cm   06/06/2022: Brain MRI: 2 small lesions left frontal lobe 3 mm and 4 mm concerning for metastatic disease, additional dural based contrast-enhancing lesion left frontal convexity possibly small meningioma, acute/subacute infarcts involving the left basal ganglia and right centrum semiovale   Hospitalization 06/03/2022-06/07/2022: Fatigue, shortness of breath Thoracentesis 06/04/2022: Negative for malignant cells U/S Right breast: 06/24/22: Rt breast mass: 6.3 cm, 3 LN axilla Rt Breast Biopsy: 06/29/22: Grade 2 IDC with extracellular mucin, lymph node positive for IDC ER 100%, PR 100%, Ki67 25%, HER2 0 negative Hospitalization 07/30/2022-07/31/2022 fall, urine incontinence     Treatment plan:  Palliative treatment with antiestrogen therapy with letrozole 2.5 mg daily CT CAP 09/01/2022: Developing multiple hepatic metastases, multiple areas of abnormal lymph node enlargement mediastinum and hilar appears similar, left adrenal metastases, persistent bilateral pleural effusions  Recommendation: Hospice care

## 2022-09-03 NOTE — Telephone Encounter (Signed)
Oral Oncology Patient Advocate Encounter   Received notification that prior authorization for Verzenio is required.   PA submitted on 09/03/22 Key [id number] Status is pending    [name], CPhT-Adv Oncology Pharmacy Patient [place] Direct Number: [phone]  Fax: [phone]

## 2022-09-03 NOTE — Telephone Encounter (Signed)
Oral Oncology Patient Advocate Encounter   Began application for assistance for Verzenio through [employer].   Application will be submitted upon completion of necessary supporting documentation.   [employer] phone number [phone].   I will continue to check the status until final determination.   Myasia Sinatra, CPhT-Adv Oncology Pharmacy Patient Advocate [facility] Direct Number: [phone]  Fax: [phone]   

## 2022-09-03 NOTE — Telephone Encounter (Signed)
Oral Oncology Patient Advocate Encounter  Prior Authorization for [name] has been approved.    PA# [id number]  Effective dates: 09/03/22 through 06/28/23  Patients co-pay is $3,310.93.    [name], CPhT-Adv Oncology Pharmacy Patient [place] Direct Number: [phone]  Fax: [phone]

## 2022-09-03 NOTE — Telephone Encounter (Signed)
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  Labs from 07/30/2022 and 07/31/2022 assessed, no interventions needed.  Pt's CrCl 43 mL/min.  Dose and frequency assessed for appropriateness.  Current medication list in Epic reviewed, no DDIs with Verzenio identified.  Evaluated chart and no patient barriers to medication adherence noted.   Patient agreement for treatment documented in MD note on 09/03/2022.  Prescription has been e-scribed to the [facility] for benefits analysis and approval.  Oral Oncology Clinic will continue to follow for insurance authorization, copayment issues, initial counseling and start date.  Donney Rankins, PharmD Candidate 09/03/2022 2:45 PM Oral Oncology Clinic [phone]

## 2022-09-06 ENCOUNTER — Telehealth: Payer: Self-pay

## 2022-09-07 NOTE — Telephone Encounter (Signed)
Oral Oncology Patient Advocate Encounter   Submitted application for assistance for Verzenio to [employer].   Application submitted via e-fax to [phone]   [employer] phone number [phone].   I will continue to check the status until final determination.   Meredyth Hornung, CPhT-Adv Oncology Pharmacy Patient Advocate [facility] Direct Number: [phone]  Fax: [phone]   

## 2022-09-07 NOTE — Telephone Encounter (Signed)
Oral Oncology Patient Advocate Encounter   Received notification that the application for assistance for Verzenio through [employer] has been approved.   [employer] number [id number].   Effective dates: 09/07/22 through 06/28/23  I have spoken to the patient.  [name], CPhT-Adv Oncology Pharmacy Patient [place] Direct Number: [phone]  Fax: [phone]

## 2022-09-09 ENCOUNTER — Ambulatory Visit: Payer: Medicare Other | Admitting: Pharmacist

## 2022-09-09 ENCOUNTER — Other Ambulatory Visit: Payer: Medicare Other

## 2022-09-10 ENCOUNTER — Other Ambulatory Visit: Payer: Self-pay | Admitting: *Deleted

## 2022-09-10 ENCOUNTER — Other Ambulatory Visit (HOSPITAL_COMMUNITY): Payer: Self-pay

## 2022-09-10 MED ORDER — ONDANSETRON 8 MG PO TBDP
8.0000 mg | ORAL_TABLET | Freq: Three times a day (TID) | ORAL | 0 refills | Status: DC | PRN
  Filled 2022-09-10: qty 20, 7d supply, fill #0

## 2022-09-10 NOTE — Telephone Encounter (Signed)
Oral Chemotherapy Pharmacist Encounter  I spoke with patient's daughter, Eustaquio Maize,  for overview of: Verzenio for the treatment of ER+ PR+ HER2- metastatic breast cancer, in combination with letrozole, planned duration until disease progression or unacceptable toxicity.   Counseled patient on administration, dosing, side effects, monitoring, drug-food interactions, safe handling, storage, and disposal.  Patient will take Verzenio 50mg  tablets, 1 tablet by mouth twice daily without regard to food.  Patient knows to avoid grapefruit and grapefruit juice.  Patient is taking letrozole 2.5mg  once daily with abemaciclib.  Verzenio start date: pending when patient receives medication from [employer].   Adverse effects include but are not limited to: diarrhea, fatigue, nausea, abdominal pain, decreased blood counts, and increased liver function tests, and joint pains. Severe, life-threatening, and/or fatal interstitial lung disease (ILD) and/or pneumonitis may occur with CDK 4/6 inhibitors.  Patient has anti-emetic on hand and knows to take it if nausea develops.   Patient will obtain anti diarrheal and alert the office of 4 or more loose stools above baseline.  Reviewed with patient importance of keeping a medication schedule and plan for any missed doses. No barriers to medication adherence identified.  Medication reconciliation performed and medication/allergy list updated.  Patient informed the pharmacy will reach out 5-7 days prior to needing next fill of Verzenio to coordinate continued medication acquisition to prevent break in therapy.  All questions answered.  Beth voiced understanding and appreciation.   Medication education handout placed in mail for patient. Patient knows to call the office with questions or concerns. Oral Chemotherapy Clinic phone number provided to patient.   Laray Anger, PharmD PGY-2 Pharmacy Resident Hematology/Oncology  09/10/2022   10:40 AM Oral  Oncology Clinic [phone]

## 2022-09-10 NOTE — Progress Notes (Signed)
Verbal orders received from NP for Zofran 8 mg p.o ODT q 8 hrs prn for nausea be sent into pharmacy for pt to take while on Verzenio.  RN attempt x1 to contact pt to educate.  No answer, LVM for pt to return call to the office.

## 2022-09-13 ENCOUNTER — Telehealth: Payer: Self-pay | Admitting: Pharmacist

## 2022-09-13 NOTE — Telephone Encounter (Signed)
Rescheduled appointment per staff message. Left voicemail. 

## 2022-09-14 ENCOUNTER — Telehealth: Payer: Self-pay

## 2022-09-14 ENCOUNTER — Other Ambulatory Visit (HOSPITAL_COMMUNITY): Payer: Self-pay

## 2022-09-14 DIAGNOSIS — N6310 Unspecified lump in the right breast, unspecified quadrant: Secondary | ICD-10-CM

## 2022-09-14 MED ORDER — ONDANSETRON 8 MG PO TBDP: 8.0000 mg | ORAL_TABLET | Freq: Three times a day (TID) | ORAL | 0 refills | Status: DC | PRN

## 2022-09-14 NOTE — Telephone Encounter (Signed)
Oral Oncology Pharmacist Encounter  Patients daughter states that she is supposed to be receiving the Verzenio prescription from the patient assistance program either today or tomorrow. Patients daughter knows that her mother can start the medication when she receives.   Prescription for zofran redirected to [facility] as patient receives medications from there.   Drema Halon, PharmD Hematology/Oncology Clinical Pharmacist [facility] [phone]

## 2022-09-16 ENCOUNTER — Inpatient Hospital Stay: Payer: Medicare Other

## 2022-09-16 ENCOUNTER — Inpatient Hospital Stay: Payer: Medicare Other | Admitting: Pharmacist

## 2022-09-28 ENCOUNTER — Emergency Department (HOSPITAL_BASED_OUTPATIENT_CLINIC_OR_DEPARTMENT_OTHER): Payer: Medicare Other

## 2022-09-28 ENCOUNTER — Encounter (HOSPITAL_BASED_OUTPATIENT_CLINIC_OR_DEPARTMENT_OTHER): Payer: Self-pay

## 2022-09-28 ENCOUNTER — Other Ambulatory Visit: Payer: Self-pay

## 2022-09-28 ENCOUNTER — Emergency Department (HOSPITAL_BASED_OUTPATIENT_CLINIC_OR_DEPARTMENT_OTHER)
Admission: EM | Admit: 2022-09-28 | Discharge: 2022-09-29 | Disposition: A | Discharge status: Home / Self Care | Payer: Medicare Other | Attending: Emergency Medicine | Admitting: Emergency Medicine

## 2022-09-28 DIAGNOSIS — I491 Atrial premature depolarization: Secondary | ICD-10-CM | POA: Diagnosis not present

## 2022-09-28 DIAGNOSIS — J90 Pleural effusion, not elsewhere classified: Secondary | ICD-10-CM | POA: Insufficient documentation

## 2022-09-28 DIAGNOSIS — I700 Atherosclerosis of aorta: Secondary | ICD-10-CM | POA: Insufficient documentation

## 2022-09-28 DIAGNOSIS — R590 Localized enlarged lymph nodes: Secondary | ICD-10-CM | POA: Diagnosis not present

## 2022-09-28 DIAGNOSIS — Z951 Presence of aortocoronary bypass graft: Secondary | ICD-10-CM | POA: Diagnosis not present

## 2022-09-28 DIAGNOSIS — U071 COVID-19: Secondary | ICD-10-CM | POA: Insufficient documentation

## 2022-09-28 DIAGNOSIS — R509 Fever, unspecified: Secondary | ICD-10-CM | POA: Diagnosis present

## 2022-09-28 DIAGNOSIS — R000 Tachycardia, unspecified: Secondary | ICD-10-CM | POA: Insufficient documentation

## 2022-09-28 DIAGNOSIS — Z853 Personal history of malignant neoplasm of breast: Secondary | ICD-10-CM | POA: Diagnosis not present

## 2022-09-28 DIAGNOSIS — Z7982 Long term (current) use of aspirin: Secondary | ICD-10-CM | POA: Insufficient documentation

## 2022-09-28 DIAGNOSIS — N6310 Unspecified lump in the right breast, unspecified quadrant: Secondary | ICD-10-CM

## 2022-09-28 LAB — CBC WITH DIFFERENTIAL/PLATELET
Abs Immature Granulocytes: 0.02 10*3/uL (ref 0.00–0.07)
Basophils Absolute: 0 10*3/uL (ref 0.0–0.1)
Basophils Relative: 1 %
Eosinophils Absolute: 0.1 10*3/uL (ref 0.0–0.5)
Eosinophils Relative: 2 %
HCT: 46.1 % — ABNORMAL HIGH (ref 36.0–46.0)
Hemoglobin: 15 g/dL (ref 12.0–15.0)
Immature Granulocytes: 1 %
Lymphocytes Relative: 13 %
Lymphs Abs: 0.6 10*3/uL — ABNORMAL LOW (ref 0.7–4.0)
MCH: 29.6 pg (ref 26.0–34.0)
MCHC: 32.5 g/dL (ref 30.0–36.0)
MCV: 91.1 fL (ref 80.0–100.0)
Monocytes Absolute: 0.3 10*3/uL (ref 0.1–1.0)
Monocytes Relative: 6 %
Neutro Abs: 3.4 10*3/uL (ref 1.7–7.7)
Neutrophils Relative %: 77 %
Platelets: 199 10*3/uL (ref 150–400)
RBC: 5.06 MIL/uL (ref 3.87–5.11)
RDW: 14.7 % (ref 11.5–15.5)
WBC: 4.3 10*3/uL (ref 4.0–10.5)
nRBC: 0 % (ref 0.0–0.2)

## 2022-09-28 LAB — COMPREHENSIVE METABOLIC PANEL
ALT: 26 U/L (ref 0–44)
AST: 32 U/L (ref 15–41)
Albumin: 4.5 g/dL (ref 3.5–5.0)
Alkaline Phosphatase: 148 U/L — ABNORMAL HIGH (ref 38–126)
Anion gap: 11 (ref 5–15)
BUN: 12 mg/dL (ref 8–23)
CO2: 24 mmol/L (ref 22–32)
Calcium: 10 mg/dL (ref 8.9–10.3)
Chloride: 101 mmol/L (ref 98–111)
Creatinine, Ser: 0.82 mg/dL (ref 0.44–1.00)
GFR, Estimated: >60 mL/min (ref >60)
Glucose, Bld: 107 mg/dL — ABNORMAL HIGH (ref 70–99)
Potassium: 4.1 mmol/L (ref 3.5–5.1)
Sodium: 136 mmol/L (ref 135–145)
Total Bilirubin: 1.1 mg/dL (ref 0.3–1.2)
Total Protein: 8.4 g/dL — ABNORMAL HIGH (ref 6.5–8.1)

## 2022-09-28 LAB — LACTIC ACID, PLASMA: Lactic Acid, Venous: 1.2 mmol/L (ref 0.5–1.9)

## 2022-09-28 NOTE — ED Provider Notes (Signed)
[facility]  Provider Note  CSN: [id number] Arrival date & time: 09/28/22 2201  History Chief Complaint  Patient presents with   Fever   Chills    [name] is a 89 y.o. female here with daughters who supplement history. Patient has a history of metastatic breast cancer complicated by malignant pleural effusion last drained December. Recently started new medication, Verzenio, about 2 weeks ago. She has otherwise been in her usual state of health but began having chills around 2000hrs today with fever up to 100.50F at home. She denies any cough, congestion, CP, SOB, abd pain. Mild nausea but no vomiting, diarrhea or dysuria. No new rashes. Patient's daughter called PCP and Oncology offices who recommend she come to the ED for evaluation. She currently denies any symptoms and states she wants to go home.    Home Medications Prior to Admission medications   Medication Sig Start Date End Date Taking? Authorizing Provider  nirmatrelvir & ritonavir (PAXLOVID, 300/100,) 20 x 150 MG & 10 x 100MG  TBPK Take 3 tablets by mouth 2 (two) times daily for 5 days. 09/29/22 10/04/22 Yes Truddie Hidden, MD  abemaciclib (VERZENIO) 50 MG tablet Take 1 tablet (50 mg total) by mouth 2 (two) times daily. 09/03/22   Nicholas Lose, MD  aspirin EC 81 MG tablet Take 1 tablet (81 mg total) by mouth daily. Swallow whole. 06/07/22 03/04/23  Oswald Hillock, MD  docusate sodium (COLACE) 100 MG capsule Take 100 mg by mouth every other day.    [provider]  letrozole (FEMARA) 2.5 MG tablet Take 1 tablet (2.5 mg total) by mouth daily. 07/05/22   Nicholas Lose, MD  Multiple Vitamin (MULTIVITAMIN PO) Take 1 tablet by mouth daily.    [provider]  ondansetron (ZOFRAN-ODT) 8 MG disintegrating tablet Take 1 tablet (8 mg total) by mouth every 8 (eight) hours as needed for nausea or vomiting. 09/14/22   Nicholas Lose, MD  pravastatin (PRAVACHOL) 80 MG tablet Take 1  tablet (80 mg total) by mouth daily at 6 PM. 07/23/22   Croitoru, Mihai, MD  Sennosides 15 MG TABS Take 1 tablet by mouth as needed.    [provider]     Allergies    Lipitor [atorvastatin] and Simvastatin   Review of Systems   Review of Systems Please see HPI for pertinent positives and negatives  Physical Exam BP (!) 108/58   Pulse 64   Temp 98.4 F (36.9 C) (Oral)   Resp (!) 22   Ht 5\' 5"  (1.651 m)   Wt 56.7 kg   SpO2 98%   BMI 20.80 kg/m   Physical Exam Vitals and nursing note reviewed.  Constitutional:      Appearance: Normal appearance.  HENT:     Head: Normocephalic and atraumatic.     Nose: Nose normal.     Mouth/Throat:     Mouth: Mucous membranes are moist.  Eyes:     Extraocular Movements: Extraocular movements intact.     Conjunctiva/sclera: Conjunctivae normal.  Cardiovascular:     Rate and Rhythm: Tachycardia present.  Pulmonary:     Effort: Pulmonary effort is normal.     Breath sounds: Normal breath sounds.     Comments: tachypnea Abdominal:     General: Abdomen is flat.     Palpations: Abdomen is soft.     Tenderness: There is no abdominal tenderness.  Musculoskeletal:        General: No  swelling. Normal range of motion.     Cervical back: Neck supple.  Skin:    General: Skin is warm and dry.  Neurological:     General: No focal deficit present.     Mental Status: She is alert.  Psychiatric:        Mood and Affect: Mood normal.     ED Results / Procedures / Treatments   EKG EKG Interpretation  Date/Time:  Tuesday September 28 2022 22:24:53 EDT Ventricular Rate:  118 PR Interval:  166 QRS Duration: 60 QT Interval:  318 QTC Calculation: 445 R Axis:   37 Text Interpretation: Sinus tachycardia with Premature atrial complexes Possible Anterior infarct , age undetermined Nonspecific ST and T wave abnormality Abnormal ECG When compared with ECG of 30-Jul-2022 07:59, PREVIOUS ECG IS PRESENT Non-specific ST-t changes Confirmed by  Calvert Cantor [phone]) on 09/28/2022 11:33:00 PM  Procedures Procedures  Medications Ordered in the ED Medications - No data to display  Initial Impression and Plan  Patient here with reported fever at home. Afebrile on arrival here but mildly tachycardic. Labs ordered in triage show CBC with normal WBC. CMP is unremarkable. Will check CXR and await other labs to evaluate source of fever.   ED Course   Clinical Course as of 09/29/22 0144  Tue Sep 28, 2022  2355 I personally viewed the images from radiology studies and agree with radiologist interpretation: CXR with recurrent of effusion [CS]  Wed Sep 29, 2022  0012 Lactic acid is normal.  [CS]  0058 INR normal.  [CS]  0131 Covid/Flu/RSV swab is positive for Covid. Given her age and other co-morbidities would benefit from [place]. Otherwise the patient is well appearing, not hypoxic or in any distress and eager to go home. She was advised to call her oncologist office in the AM to discuss holding her chemo while she is sick. Family encouraged to monitor her breathing and SpO2 and to return to the ED if she gets worse in the next few days.  [CS]    Clinical Course User Index [CS] Truddie Hidden, MD     MDM Rules/Calculators/A&P Medical Decision Making Given presenting complaint, I considered that admission might be necessary. After review of results from ED lab and/or imaging studies, admission to the hospital is not indicated at this time.    Problems Addressed: COVID-19: acute illness or injury  Amount and/or Complexity of Data Reviewed Labs: ordered. Decision-making details documented in ED Course. Radiology: ordered and independent interpretation performed. Decision-making details documented in ED Course. ECG/medicine tests: ordered and independent interpretation performed. Decision-making details documented in ED Course.  Risk Prescription drug management. Decision regarding hospitalization.     Final Clinical  Impression(s) / ED Diagnoses Final diagnoses:  [id number]    Rx / DC Orders ED Discharge Orders          Ordered    nirmatrelvir & ritonavir (PAXLOVID, 300/100,) 20 x 150 MG & 10 x 100MG  TBPK  2 times daily        09/29/22 0144             Truddie Hidden, MD 09/29/22 [phone]

## 2022-09-28 NOTE — ED Notes (Signed)
IV established, one set of BC and blood work collected and sent

## 2022-09-28 NOTE — ED Notes (Signed)
EDP at bedside  

## 2022-09-28 NOTE — ED Triage Notes (Signed)
POV from home, A&O x 4, GCS 15, amb to triage with walker  Sts that she's had fever/chills since 8pm, temp orally at home 100.106F with cold chills, pt did not take tylenol or motrin due to home meds, called on call nurse and they recommended to come in. Been on verzenio x 2 weeks.

## 2022-09-29 ENCOUNTER — Inpatient Hospital Stay: Payer: Medicare Other | Admitting: Pharmacist

## 2022-09-29 ENCOUNTER — Inpatient Hospital Stay: Payer: Medicare Other

## 2022-09-29 ENCOUNTER — Telehealth: Payer: Self-pay | Admitting: Hematology and Oncology

## 2022-09-29 ENCOUNTER — Telehealth: Payer: Self-pay

## 2022-09-29 ENCOUNTER — Telehealth: Payer: Self-pay | Admitting: *Deleted

## 2022-09-29 LAB — RESP PANEL BY RT-PCR (RSV, FLU A&B, COVID)  RVPGX2
Influenza A by PCR: NEGATIVE
Influenza B by PCR: NEGATIVE
Resp Syncytial Virus by PCR: NEGATIVE
SARS Coronavirus 2 by RT PCR: POSITIVE — AB

## 2022-09-29 LAB — PROTIME-INR
INR: 1.1 (ref 0.8–1.2)
Prothrombin Time: 13.8 seconds (ref 11.4–15.2)

## 2022-09-29 LAB — LACTIC ACID, PLASMA: Lactic Acid, Venous: 0.8 mmol/L (ref 0.5–1.9)

## 2022-09-29 MED ORDER — PAXLOVID (300/100) 20 X 150 MG & 10 X 100MG PO TBPK: 3.0000 | ORAL_TABLET | Freq: Two times a day (BID) | ORAL | 0 refills | Status: DC

## 2022-09-29 NOTE — ED Notes (Signed)
ED Provider at bedside. 

## 2022-09-29 NOTE — Telephone Encounter (Signed)
Scheduled appointment per staff message. Talked with the patients daughter and is aware of the made appointment.

## 2022-09-29 NOTE — ED Notes (Signed)
RN reviewed discharge instructions with pt. Pt verbalized understanding and had no further questions. VSS upon discharge.  

## 2022-09-29 NOTE — Discharge Instructions (Addendum)
Please hold your Verzenio while taking Paxlovid.

## 2022-09-29 NOTE — Telephone Encounter (Signed)
RN placed call to pt daughter Ivin Booty per request of Jenny Reichmann [facility] to alert pt that Melynda Keller will need to be held until Covid symptoms have dissipated and pt is starting to feel better. Ivin Booty verbalized understanding, scheduling notified to reschedule upcoming appt due to Covid policy.

## 2022-09-29 NOTE — Transitions of Care (Post Inpatient/ED Visit) (Signed)
   09/29/2022  Name: [name] MRN: [id number] DOB: 04/14/1928  Today's TOC FU Call Status: Today's TOC FU Call Status:: Successful TOC FU Call Competed TOC FU Call Complete Date: 09/29/22  Transition Care Management Follow-up Telephone Call Date of Discharge: 09/28/22 Discharge Facility: [facility] ([facility]) Type of Discharge: Emergency Department Reason for ED Visit: Other: (Covid-19) How have you been since you were released from the hospital?: Same Any questions or concerns?: No  Items Reviewed: Did you receive and understand the discharge instructions provided?: Yes Medications obtained and verified?: Yes (Medications Reviewed) Any new allergies since your discharge?: No Dietary orders reviewed?: No Do you have support at home?: Yes People in Home: child(ren), adult Name of Support/Comfort Primary Source: [facility] and Equipment/Supplies: [place] Ordered?: No Any new equipment or medical supplies ordered?: No  Functional Questionnaire: Do you need assistance with bathing/showering or dressing?: No Do you need assistance with meal preparation?: No Do you need assistance with eating?: No Do you have difficulty maintaining continence: No Do you need assistance with getting out of bed/getting out of a chair/moving?: No Do you have difficulty managing or taking your medications?: No  Follow up appointments reviewed: PCP Follow-up appointment confirmed?: Yes Date of PCP follow-up appointment?: 10/04/22 Follow-up Provider: Windell Moulding, NP [facility] Follow-up appointment confirmed?: No Do you need transportation to your follow-up appointment?: No Do you understand care options if your condition(s) worsen?: Yes-patient verbalized understanding    SIGNATURE: [place].D/RMA

## 2022-09-30 ENCOUNTER — Telehealth (HOSPITAL_BASED_OUTPATIENT_CLINIC_OR_DEPARTMENT_OTHER): Payer: Self-pay | Admitting: Emergency Medicine

## 2022-09-30 ENCOUNTER — Ambulatory Visit: Payer: Medicare Other | Admitting: Orthopedic Surgery

## 2022-10-04 ENCOUNTER — Inpatient Hospital Stay: Payer: Medicare Other | Attending: Hematology and Oncology | Admitting: Pharmacist

## 2022-10-04 ENCOUNTER — Encounter: Payer: Self-pay | Admitting: Orthopedic Surgery

## 2022-10-04 ENCOUNTER — Telehealth (INDEPENDENT_AMBULATORY_CARE_PROVIDER_SITE_OTHER): Payer: Medicare Other | Admitting: Orthopedic Surgery

## 2022-10-04 DIAGNOSIS — C7931 Secondary malignant neoplasm of brain: Secondary | ICD-10-CM | POA: Diagnosis not present

## 2022-10-04 DIAGNOSIS — N6310 Unspecified lump in the right breast, unspecified quadrant: Secondary | ICD-10-CM

## 2022-10-04 DIAGNOSIS — U071 COVID-19: Secondary | ICD-10-CM | POA: Diagnosis not present

## 2022-10-04 DIAGNOSIS — Z79811 Long term (current) use of aromatase inhibitors: Secondary | ICD-10-CM | POA: Insufficient documentation

## 2022-10-04 DIAGNOSIS — C50911 Malignant neoplasm of unspecified site of right female breast: Secondary | ICD-10-CM

## 2022-10-04 DIAGNOSIS — Z170 Estrogen receptor positive status [ER+]: Secondary | ICD-10-CM | POA: Insufficient documentation

## 2022-10-04 LAB — CULTURE, BLOOD (ROUTINE X 2)
Culture: NO GROWTH
Special Requests: ADEQUATE

## 2022-10-04 NOTE — Progress Notes (Signed)
[facility]       Telephone: [phone]?Fax: [phone]   Oncology Clinical Pharmacist Practitioner Initial Assessment  [name] is a 89 y.o. female with a diagnosis of metastatic breast cancer. They were contacted today via in-person visit.  I connected with [name] today by telephone and verified that I was speaking with the correct person using two patient identifiers. I discussed the limitations, risks, security and privacy concerns of performing an evaluation and management service by telemedicine and the availability of in-person appointments. The patient/caregiver expressed understanding and agreed to proceed.  Other persons participating in the visit and their role in the encounter: [employer] aid/development worker)   Patient's location: home  Provider's location: clinic  Indication/Regimen Abemaciclib (Verzenio) is being used appropriately for treatment of metastatic breast cancer by Dr. Serena Croissant.    Wt Readings from Last 1 Encounters:  09/28/22 125 lb (56.7 kg)    Estimated body surface area is 1.61 meters squared as calculated from the following:   Height as of 09/28/22: 5\' 5"  (1.651 m).   Weight as of 09/28/22: 125 lb (56.7 kg).  The dosing regimen is 50 mg by mouth every 12 hours on days 1 to 28 of a 28-day cycle. This is being given  in combination with letrozole . It is planned to continue until disease progression or unacceptable toxicity.  [name] was contacted today via telephone to establish care for her abemaciclib management after being referred by Dr. Pamelia Hoit.  We spoke to her daughter [name] who is also her caregiver.  We had a telephone conversation because the patient tested positive for COVID last week and finished taking nirmatrelvir & ritonavir (Paxlovid) yesterday.  Her daughter states that the patient tolerated this extremely well and is asymptomatic at this time.  We discussed restarting abemaciclib tomorrow as she has been holding it  since starting nirmatrelvir & ritonavir (Paxlovid).  The patient will restart this tomorrow morning.  We did discuss that ideally abemaciclib should be taken every 12 hours with or without food.  We discussed that we will obtain labs and have a pharmacy clinic visit with the patient in approximately 2 weeks.  We will add these orders today.  We discussed with her daughter that the patient can come in sooner if she is experiencing any side effects or has concerns.  They verbalized understanding of the plan.  The patient does have a history of constipation and has taken sennosides in the past.  However, since starting abemaciclib, she has not needed to take the sennosides.  This is likely due to the increased incidence of loose stool with abemaciclib.  She is not having any loose stool at this time but is more regular than she was prior to starting abemaciclib.  We did discuss that should she have loose stool she could take over-the-counter loperamide (Imodium).  We also discussed that we will be getting labs every 2 weeks for the first 2 months followed by monthly labs for 2 months, and then as clinically indicated.  We also discussed to avoid grapefruit products while on abemaciclib and reviewed the signs and symptoms of pneumonitis and VTE.  Her medication list was accurate but we did add a probiotic per the daughter's request.    Dose Modifications Dr. Pamelia Hoit is starting abemaciclib at a dose reduction of 50 mg every 12 hours.  This will be reassessed and possibly increased if the patient tolerates this dose.  Access Assessment Lynn Math Laning  will be receiving [facility]lib [facility] Insurance Concerns: None Start date if known: 09/15/22 --held abemaciclib starting with the evening dose on 09/28/22.  Restarting tomorrow morning as above  Allergies Allergies  Allergen Reactions   Lipitor [Atorvastatin]    Simvastatin     Vitals = No vitals or labs were done today for this  telemedicine visit    09/29/2022    1:30 AM 09/28/2022   10:18 PM 09/28/2022   10:16 PM  Oncology Vitals  Height   165 cm  Weight   56.7 kg  Weight (lbs)   125 lbs  BMI   20.8 kg/m2   20.8 kg/m2  Temp  98.4 F (36.9 C)   Pulse Rate 64 117   BP 108/58 149/83   Resp 22 22   SpO2 98 % 96 %   BSA (m2)   1.61 m2   1.61 m2     Laboratory Data    Latest Ref Rng & Units 09/28/2022   10:40 PM 07/31/2022    4:43 AM 07/30/2022    8:20 AM  CBC EXTENDED  WBC 4.0 - 10.5 K/uL 4.3  6.9  15.1   RBC 3.87 - 5.11 MIL/uL 5.06  4.03  5.09   Hemoglobin 12.0 - 15.0 g/dL 16.115.0  09.612.4  04.515.3   HCT 36.0 - 46.0 % 46.1  36.2  46.8   Platelets 150 - 400 K/uL 199  181  255   NEUT# 1.7 - 7.7 K/uL 3.4  4.6  12.4   Lymph# 0.7 - 4.0 K/uL 0.6  1.3  1.3        Latest Ref Rng & Units 09/28/2022   10:40 PM 07/31/2022    4:43 AM 07/30/2022   10:30 AM  CMP  Glucose 70 - 99 mg/dL 409107  811114    BUN 8 - 23 mg/dL 12  8    Creatinine 9.140.44 - 1.00 mg/dL 7.820.82  9.560.72    Sodium 213135 - 145 mmol/L 136  139    Potassium 3.5 - 5.1 mmol/L 4.1  3.3    Chloride 98 - 111 mmol/L 101  110    CO2 22 - 32 mmol/L 24  21    Calcium 8.9 - 10.3 mg/dL 08.610.0  8.2    Total Protein 6.5 - 8.1 g/dL 8.4   6.5   Total Bilirubin 0.3 - 1.2 mg/dL 1.1   0.9   Alkaline Phos 38 - 126 U/L 148   98   AST 15 - 41 U/L 32   44   ALT 0 - 44 U/L 26   36    Lab Results  Component Value Date   MG 2.0 07/31/2022   MG 2.3 12/01/2007   MG 2.7 (H) 11/30/2007   Lab Results  Component Value Date   CA2729 31.1 06/06/2022   Contraindications Contraindications were reviewed?  Yes Contraindications to therapy were identified?  No  Safety Precautions The following safety precautions for the use of abemaciclib were reviewed:  Diarrhea: we reviewed that diarrhea is common with abemaciclib and confirmed that she does have loperamide (Imodium) at home.  We reviewed how to take this medication PRN and gave her information on abemaciclib Neutropenia: we discussed the  importance of having a thermometer and what the Centers for Disease Control and Prevention (CDC) considers a fever which is 100.16F (38C) or higher.  Gave patient 24/7 triage line to call if any fevers or symptoms ILD/Pneumonitis: we reviewed potential symptoms including cough, shortness,  and fatigue. Hepatotoxicity: reviewed to contact clinic for RUQ pain that will not subside, yellowing of eyes/skin Venous thromboembolism (VTE): reviewed signs of deep vein thrombosis (DVT) such as leg swelling, redness, pain, or tenderness and signs of pulmonary embolism (PE) such as shortness of breath, rapid or irregular heartbeat, cough, chest pain, or lightheadedness Reviewed to take the medication every 12 hours (with food sometimes can be easier on the stomach) and to take it at the same time every day. Discussed proper storage and handling of abemaciclib We discussed the importance of drinking plenty of fluids We discussed proper storage and handling  Medication Reconciliation Current Outpatient Medications  Medication Sig Dispense Refill   abemaciclib (VERZENIO) 50 MG tablet Take 1 tablet (50 mg total) by mouth 2 (two) times daily. 60 tablet 0   aspirin EC 81 MG tablet Take 1 tablet (81 mg total) by mouth daily. Swallow whole. 90 tablet 2   letrozole (FEMARA) 2.5 MG tablet Take 1 tablet (2.5 mg total) by mouth daily. 90 tablet 3   Multiple Vitamin (MULTIVITAMIN PO) Take 1 tablet by mouth daily.     pravastatin (PRAVACHOL) 80 MG tablet Take 1 tablet (80 mg total) by mouth daily at 6 PM. 90 tablet 3   UNABLE TO FIND Med Name: [employer] Probiotic     ondansetron (ZOFRAN-ODT) 8 MG disintegrating tablet Take 1 tablet (8 mg total) by mouth every 8 (eight) hours as needed for nausea or vomiting. (Patient not taking: Reported on 10/04/2022) 20 tablet 0   Sennosides 15 MG TABS Take 1 tablet by mouth as needed. (Patient not taking: Reported on 10/04/2022)     No current facility-administered medications  for this visit.    Medication reconciliation is based on the patient's most recent medication list in the electronic medical record (EMR) including herbal products and OTC medications.   The patient's medication list was reviewed today with the patient? Yes   Drug-drug interactions (DDIs) DDIs were evaluated? Yes Significant DDIs identified? No   Drug-Food Interactions Drug-food interactions were evaluated? Yes Drug-food interactions identified?  Avoid grapefruit products  Follow-up Plan  Restart abemaciclib 50 mg by mouth every 12 hours starting tomorrow morning Continue letrozole 2.5 mg by mouth daily Labs, pharmacy clinic visit, on 10/19/22 Labs, Dr. Pamelia Hoit visit, on 11/02/22 May use loperamide for loose stool May use ondansetron for nausea  Lynn Math Wanke participated in the discussion, expressed understanding, and voiced agreement with the above plan. All questions were answered to her satisfaction. The patient was advised to contact the clinic at [phone] with any questions or concerns prior to her return visit.   I spent 60 minutes assessing the patient.  Danyella Mcginty A. Odetta Pink, PharmD, BCOP, CPP  Anselm Lis, RPH-CPP, 10/04/2022 4:27 PM  **Disclaimer: This note was dictated with voice recognition software. Similar sounding words can inadvertently be transcribed and this note may contain transcription errors which may not have been corrected upon publication of note.**

## 2022-10-04 NOTE — Progress Notes (Signed)
Careteam: Patient Care Team: Octavia HeirFargo, [place] Maulden E, NP as PCP - General (Adult Health Nurse Practitioner) Serena CroissantGudena, Vinay, MD as Consulting Physician (Hematology and Oncology) Pershing ProudStuart, Dawn C, RN as Oncology Nurse Navigator Donnelly AngelicaMartini, Keisha N, RN as Oncology Nurse Navigator Carman ChingMiller, Steven L, OD (Optometry) Croitoru, RangerMihai, MD as Consulting Physician (Cardiology) Serena CroissantGudena, Vinay, MD as Consulting Physician (Hematology and Oncology) Nita SellsHall, John, MD (Dermatology)  Seen by: Hazle NordmannAmy Hazelene Doten, AGNP-C  [facility]  Advanced Directive information Does Patient Have a Medical Advance Directive?: Yes, Type of Advance Directive: Healthcare Power of Claflin[place]orney;Living will;Out of facility DNR (pink MOST or yellow form), Does patient want to make changes to medical advance directive?: No - Patient declined  Allergies  Allergen Reactions   Lipitor [Atorvastatin]    Simvastatin     Chief Complaint  Patient presents with   Transitions Of Care    TOC 09/28/2022-09/29/2022 for Covid.      HPI: Patient is a 89 y.o. female seen today via video visit s/p ED visit 04/02 due to covid.   She currently resides at home with family. 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   04/02 she presented to the ED with fever 100.8, and chills. She had recently started abemaciclib 2 weeks prior due to breast cancer. WBC, lactic acid, and blood cultures negative. Respiratory panel revealed +covid result. Alkaline phos was also elevated at 148. She was prescribed Paxlovid and discharged home. Today, she denies any long term symptoms from covid. She has completed Paxlovid. She is scheduled to discuss restarting abemaciclib with pharmacist after this encounter. Afebile at this time.   Review of Systems:  Review of Systems  Constitutional:  Negative for chills and fever.  HENT:  Negative for congestion and sore throat.   Respiratory:  Negative for cough,  shortness of breath and wheezing.   Cardiovascular:  Negative for chest pain and leg swelling.  Gastrointestinal:  Negative for abdominal pain, nausea and vomiting.  Musculoskeletal:  Negative for myalgias.  Neurological:  Negative for dizziness and headaches.  Psychiatric/Behavioral:  Negative for depression. The patient is not nervous/anxious.     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   Past Surgical History:  Procedure Laterality Date   BREAST BIOPSY Right 06/29/2022   US RT BREAST BX W LOC DEV 1ST LESION IMG BX SPEC US GUIDE 06/29/2022 [facility]   CORONARY ANGIOPLASTY WITH STENT PLACEMENT  11/21/2007   stenting of the RCA followed by staged LAD intervention   CORONARY ARTERY BYPASS GRAFT  11/30/2007   LIMA to LAD,SVG to 1st and 2nd diagonal arteries   NM MYOVIEW LTD  07/13/2010   normal   Social History:   reports that she has never smoked. She has never used smokeless tobacco. She reports that she does not drink alcohol and does not use drugs.  Family History  Problem Relation Age of Onset   Diabetes Mother    Heart failure Mother    Stroke Mother  Heart failure Father    Diabetes Son    Diabetes Son     Medications: Patient's Medications  New Prescriptions   No medications on file  Previous Medications   ABEMACICLIB (VERZENIO) 50 MG TABLET    Take 1 tablet (50 mg total) by mouth 2 (two) times daily.   ASPIRIN EC 81 MG TABLET    Take 1 tablet (81 mg total) by mouth daily. Swallow whole.   LETROZOLE (FEMARA) 2.5 MG TABLET    Take 1 tablet (2.5 mg total) by mouth daily.   MULTIPLE VITAMIN (MULTIVITAMIN PO)    Take 1  tablet by mouth daily.   ONDANSETRON (ZOFRAN-ODT) 8 MG DISINTEGRATING TABLET    Take 1 tablet (8 mg total) by mouth every 8 (eight) hours as needed for nausea or vomiting.   PRAVASTATIN (PRAVACHOL) 80 MG TABLET    Take 1 tablet (80 mg total) by mouth daily at 6 PM.   SENNOSIDES 15 MG TABS    Take 1 tablet by mouth as needed.  Modified Medications   No medications on file  Discontinued Medications   DOCUSATE SODIUM (COLACE) 100 MG CAPSULE    Take 100 mg by mouth every other day.   NIRMATRELVIR & RITONAVIR (PAXLOVID, 300/100,) 20 X 150 MG & 10 X 100MG  TBPK    Take 3 tablets by mouth 2 (two) times daily for 5 days.    Physical Exam:  There were no vitals filed for this visit. There is no height or weight on file to calculate BMI. Wt Readings from Last 3 Encounters:  09/28/22 125 lb (56.7 kg)  09/03/22 124 lb 3.2 oz (56.3 kg)  09/02/22 125 lb 12.8 oz (57.1 kg)    Physical Exam Vitals (exam limited due to video visit) reviewed.  Neurological:     Mental Status: She is alert.     Labs reviewed: Basic Metabolic Panel: Recent Labs    07/30/22 0820 07/31/22 0443 09/28/22 2240  NA 142 139 136  K 4.0 3.3* 4.1  CL 108 110 101  CO2 21* 21* 24  GLUCOSE 125* 114* 107*  BUN 16 8 12   CREATININE 1.07* 0.72 0.82  CALCIUM 9.3 8.2* 10.0  MG  --  2.0  --    Liver Function Tests: Recent Labs    06/05/22 1047 07/30/22 1030 09/28/22 2240  AST 32 44* 32  ALT 24 36 26  ALKPHOS 99 98 148*  BILITOT 1.0 0.9 1.1  PROT 6.5 6.5 8.4*  ALBUMIN 3.1* 3.4* 4.5   No results for input(s): "LIPASE", "AMYLASE" in the last 8760 hours. No results for input(s): "AMMONIA" in the last 8760 hours. CBC: Recent Labs    07/30/22 0820 07/31/22 0443 09/28/22 2240  WBC 15.1* 6.9 4.3  NEUTROABS 12.4* 4.6 3.4  HGB 15.3* 12.4 15.0  HCT 46.8* 36.2 46.1*  MCV 91.9 89.8 91.1  PLT 255 181 199   Lipid Panel: Recent Labs    06/06/22 0438  CHOL 279*  HDL 42  LDLCALC 217*  TRIG 101  CHOLHDL 6.6    TSH: No results for input(s): "TSH" in the last 8760 hours. A1C: Lab Results  Component Value Date   HGBA1C 6.4 (H) 06/06/2022     Assessment/Plan 1. COVID-19 - 04/02 tested positive> symptoms fever 100.8 and chills - ED labs unremarkable> + covid/ elevated alk phos - completed paxlovid - no long term symptoms at this time  2. Cancer of right breast metastatic to brain - followed by Dr. Pamelia Hoit and  palliative care - recently started abemaciclib (paused while on Paxlovid) - 03/05 CT CAP> concerns for multiple metastatic metastasis/ left adrenal metastasis - cont letrozole  Virtual Visit   I connected with Stann Ore vis video visit and verified that I am speaking with the correct person using two identifiers.  Location: [facility] clinic Patient: [name]  Provider:Letecia Arps Norval Gable, NP    I discussed the limitations, risks, security and privacy concerns of performing an evaluation and management service by telephone and the availability of in person appointments. I also discussed with the patient that there may be a patient responsible charge related to this service. The patient expressed understanding and agreed to proceed.   I discussed the assessment and treatment plan with the patient. The patient was provided an opportunity to ask questions and all were answered. The patient agreed with the plan and demonstrated an understanding of the instructions.   The patient was advised to call back or seek an in-person evaluation if the symptoms worsen or if the condition fails to improve as anticipated.  I provided 11 minutes face-to-face time during this encounter.  Hazle Nordmann, Juel Burrow Avs printed and mailed   Next appt: 10/21/2022  Bettina Gavia  [facility] [phone]

## 2022-10-04 NOTE — Progress Notes (Signed)
   This service is provided via telemedicine  No vital signs collected/recorded due to the encounter was a telemedicine visit.   Location of patient (ex: home, work):  Home  Patient consents to a telephone visit:  Yes  Location of the provider (ex: office, home):  [facility]   Name of any referring provider:  [name], NP   Names of all persons participating in the telemedicine service and their role in the encounter:  Patient, Daughter Beth, Meda Klinefelter, RMA, Hazle Nordmann, NP.    Time spent on call: 8 minutes spent on the phone with Medical Assistant.

## 2022-10-06 ENCOUNTER — Telehealth: Payer: Self-pay | Admitting: Pharmacist

## 2022-10-06 NOTE — Telephone Encounter (Signed)
Scheduled appointments per staff message. Talked with the patients daughter Waynetta Sandy and she is aware of the made appointments.

## 2022-10-18 ENCOUNTER — Encounter: Payer: Medicare Other | Admitting: Adult Health

## 2022-10-19 ENCOUNTER — Telehealth: Payer: Self-pay | Admitting: *Deleted

## 2022-10-19 NOTE — Telephone Encounter (Signed)
Received VM from pt daughter Waynetta Sandy requesting to speak with RN.  RN attempt x1 to return call, no answer, LVM for Beth to return call to the office.

## 2022-10-19 NOTE — Telephone Encounter (Signed)
Pt daughter Laya Letendre was called to f/u on pt appt expectations on Thursday. Gave information about appts and what they detailed. Pt daughter verbalized understanding.

## 2022-10-21 ENCOUNTER — Other Ambulatory Visit: Payer: Self-pay

## 2022-10-21 ENCOUNTER — Inpatient Hospital Stay: Payer: Medicare Other

## 2022-10-21 ENCOUNTER — Ambulatory Visit: Payer: Medicare Other | Admitting: Orthopedic Surgery

## 2022-10-21 ENCOUNTER — Inpatient Hospital Stay: Payer: Medicare Other | Admitting: Pharmacist

## 2022-10-21 VITALS — BP 133/66 | HR 95 | Temp 97.5°F | Resp 17 | Ht 65.0 in | Wt 121.9 lb

## 2022-10-21 DIAGNOSIS — Z79811 Long term (current) use of aromatase inhibitors: Secondary | ICD-10-CM | POA: Diagnosis not present

## 2022-10-21 DIAGNOSIS — C7931 Secondary malignant neoplasm of brain: Secondary | ICD-10-CM | POA: Diagnosis present

## 2022-10-21 DIAGNOSIS — Z170 Estrogen receptor positive status [ER+]: Secondary | ICD-10-CM | POA: Diagnosis not present

## 2022-10-21 DIAGNOSIS — C50911 Malignant neoplasm of unspecified site of right female breast: Secondary | ICD-10-CM | POA: Diagnosis present

## 2022-10-21 DIAGNOSIS — N6310 Unspecified lump in the right breast, unspecified quadrant: Secondary | ICD-10-CM

## 2022-10-21 DIAGNOSIS — J910 Malignant pleural effusion: Secondary | ICD-10-CM

## 2022-10-21 LAB — CBC WITH DIFFERENTIAL (CANCER CENTER ONLY)
Abs Immature Granulocytes: 0 10*3/uL (ref 0.00–0.07)
Basophils Absolute: 0 10*3/uL (ref 0.0–0.1)
Basophils Relative: 0 %
Eosinophils Absolute: 0.1 10*3/uL (ref 0.0–0.5)
Eosinophils Relative: 1 %
HCT: 41.1 % (ref 36.0–46.0)
Hemoglobin: 13.6 g/dL (ref 12.0–15.0)
Immature Granulocytes: 0 %
Lymphocytes Relative: 34 %
Lymphs Abs: 1.2 10*3/uL (ref 0.7–4.0)
MCH: 30.5 pg (ref 26.0–34.0)
MCHC: 33.1 g/dL (ref 30.0–36.0)
MCV: 92.2 fL (ref 80.0–100.0)
Monocytes Absolute: 0.4 10*3/uL (ref 0.1–1.0)
Monocytes Relative: 12 %
Neutro Abs: 1.9 10*3/uL (ref 1.7–7.7)
Neutrophils Relative %: 53 %
Platelet Count: 181 10*3/uL (ref 150–400)
RBC: 4.46 MIL/uL (ref 3.87–5.11)
RDW: 15.5 % (ref 11.5–15.5)
WBC Count: 3.5 10*3/uL — ABNORMAL LOW (ref 4.0–10.5)
nRBC: 0 % (ref 0.0–0.2)

## 2022-10-21 LAB — CMP (CANCER CENTER ONLY)
ALT: 13 U/L (ref 0–44)
AST: 20 U/L (ref 15–41)
Albumin: 3.9 g/dL (ref 3.5–5.0)
Alkaline Phosphatase: 114 U/L (ref 38–126)
Anion gap: 6 (ref 5–15)
BUN: 12 mg/dL (ref 8–23)
CO2: 27 mmol/L (ref 22–32)
Calcium: 9.4 mg/dL (ref 8.9–10.3)
Chloride: 107 mmol/L (ref 98–111)
Creatinine: 0.93 mg/dL (ref 0.44–1.00)
GFR, Estimated: 57 mL/min — ABNORMAL LOW (ref >60)
Glucose, Bld: 105 mg/dL — ABNORMAL HIGH (ref 70–99)
Potassium: 3.9 mmol/L (ref 3.5–5.1)
Sodium: 140 mmol/L (ref 135–145)
Total Bilirubin: 0.7 mg/dL (ref 0.3–1.2)
Total Protein: 7.4 g/dL (ref 6.5–8.1)

## 2022-10-21 MED ORDER — ABEMACICLIB 50 MG PO TABS: 50.0000 mg | ORAL_TABLET | Freq: Two times a day (BID) | ORAL | 5 refills | Status: DC

## 2022-10-21 NOTE — Progress Notes (Signed)
[facility]       Telephone: [phone]?Fax: [phone]   Oncology Clinical Pharmacist Practitioner Progress Note  [name] was contacted via in-person to discuss her chemotherapy regimen for abemaciclib which they receive under the care of Dr. Serena Croissant. She is accompanied by her daughter. She has 10 children, 2 girls and 8 boys, 12 grandchildren and 3 great-grandchildren.  Current treatment regimen and start date Abemaciclib (09/22/22) Letrozole (07/05/22)  Interval History She continues on abemaciclib 50 mg by mouth every 12 hours on days 1 to 28 of a 28-day cycle. This is being given in combination with letrozole . Therapy is planned to continue until disease progression or unacceptable toxicity.  [name] last spoke to clinical pharmacy on 10/04/22 via telephone visit and last saw Dr. Pamelia Hoit on 09/03/22. [name] had held abemaciclib on 09/29/22 while on nirmatrelvir & ritonavir (Paxlovid) which she tolerated well. She then restarted abemaciclib on 10/05/22. She continues with home physical therapy 2 times per week.  Response to Therapy [name] is doing well.  Besides some weight loss, she is tolerating abemaciclib very well.  She is not reporting any nausea, vomiting, or diarrhea.  She does state that she has had little appetite and we did discuss that she could see a [place] dietitian interested and she says she would consider it at a later time.  We also discussed that she can try to have shakes such as Ensure if she is not having much of an appetite.  We also gave her the [employer] eating hands packet that discusses some recommendations during cancer treatment.  We also again went over some of the more rare side effects of abemaciclib such as VTE and pneumonitis and symptoms to watch out for.  She has labs and a Dr. Pamelia Hoit visit already scheduled for 11/11/22 and we will add labs and a pharmacy visit 1 month later on 12/09/2022.  She knows that she  can contact Dr. Earmon [place] clinic with any questions or concerns in the interim.  We did discuss potentially going up on the abemaciclib dose at some point in the future but because her white blood cells did decrease from her last visit, we will hold off doing that at this time. Labs, vitals, treatment parameters, and manufacturer guidelines assessing toxicity were reviewed with [name] today. Based on these values, patient is in agreement to continue abemaciclib therapy at this time.  Allergies Allergies  Allergen Reactions   Lipitor [Atorvastatin]    Simvastatin     Vitals    10/21/2022   11:09 AM 09/29/2022    1:30 AM 09/28/2022   10:18 PM  Oncology Vitals  Height 165 cm    Weight 55.293 kg    Weight (lbs) 121 lbs 14 oz    BMI 20.29 kg/m2   20.29 kg/m2    Temp 97.5 F (36.4 C)  98.4 F (36.9 C)  Pulse Rate 95 64 117  BP 133/66 108/58 149/83  Resp SpO2 100 % 98 % 96 %  BSA (m2) 1.59 m2   1.59 m2      Laboratory Data    Latest Ref Rng & Units 10/21/2022   10:51 AM 09/28/2022   10:40 PM 07/31/2022    4:43 AM  CBC EXTENDED  WBC 4.0 - 10.5 K/uL 3.5  4.3  6.9   RBC 3.87 - 5.11 MIL/uL 4.46  5.06  4.03   Hemoglobin 12.0 -  15.0 g/dL 16.1  09.6  04.5   HCT 36.0 - 46.0 % 41.1  46.1  36.2   Platelets 150 - 400 K/uL 181  199  181   NEUT# 1.7 - 7.7 K/uL 1.9  3.4  4.6   Lymph# 0.7 - 4.0 K/uL 1.2  0.6  1.3        Latest Ref Rng & Units 10/21/2022   10:51 AM 09/28/2022   10:40 PM 07/31/2022    4:43 AM  CMP  Glucose 70 - 99 mg/dL 409  811  914   BUN 8 - 23 mg/dL 12  12  8    Creatinine 0.44 - 1.00 mg/dL 7.82  9.56  2.13   Sodium 135 - 145 mmol/L 140  136  139   Potassium 3.5 - 5.1 mmol/L 3.9  4.1  3.3   Chloride 98 - 111 mmol/L 107  101  110   CO2 22 - 32 mmol/L 27  24  21    Calcium 8.9 - 10.3 mg/dL 9.4  08.6  8.2   Total Protein 6.5 - 8.1 g/dL 7.4  8.4    Total Bilirubin 0.3 - 1.2 mg/dL 0.7  1.1    Alkaline Phos 38 - 126 U/L 114  148    AST 15 - 41 U/L 20  32    ALT 0  - 44 U/L 13  26      Adverse Effects Assessment Appetite: states that she is not felt like each much lately. We gave recommendations as above and discussed considering an ambulatory referral to a dietitian.   Adherence Assessment [name] reports missing 0 doses over the past 2 weeks.   Reason for missed dose: n/a Patient was re-educated on importance of adherence.   Access Assessment [name] is currently receiving her abemaciclib through [facility] -- refill sent today Insurance concerns:  none  Medication Reconciliation The patient's medication list was reviewed today with the patient? Yes New medications or herbal supplements have recently been started? No  Any medications have been discontinued? No  The medication list was updated and reconciled based on the patient's most recent medication list in the electronic medical record (EMR) including herbal products and OTC medications.   Medications Current Outpatient Medications  Medication Sig Dispense Refill   abemaciclib (VERZENIO) 50 MG tablet Take 1 tablet (50 mg total) by mouth 2 (two) times daily. 60 tablet 0   aspirin EC 81 MG tablet Take 1 tablet (81 mg total) by mouth daily. Swallow whole. 90 tablet 2   letrozole (FEMARA) 2.5 MG tablet Take 1 tablet (2.5 mg total) by mouth daily. 90 tablet 3   Multiple Vitamin (MULTIVITAMIN PO) Take 1 tablet by mouth daily.     pravastatin (PRAVACHOL) 80 MG tablet Take 1 tablet (80 mg total) by mouth daily at 6 PM. 90 tablet 3   UNABLE TO FIND Med Name: Renewlife Women's Care Probiotic     ondansetron (ZOFRAN-ODT) 8 MG disintegrating tablet Take 1 tablet (8 mg total) by mouth every 8 (eight) hours as needed for nausea or vomiting. (Patient not taking: Reported on 10/04/2022) 20 tablet 0   Sennosides 15 MG TABS Take 1 tablet by mouth as needed. (Patient not taking: Reported on 10/04/2022)     No current facility-administered medications for this visit.    Drug-Drug  Interactions (DDIs) DDIs were evaluated? Yes Significant DDIs? No  The patient was instructed to speak with their health care provider and/or the oral  chemotherapy pharmacist before starting any new drug, including prescription or over the counter, natural / herbal products, or vitamins.  Supportive Care Diarrhea: we reviewed that diarrhea is common with abemaciclib and confirmed that she does have loperamide (Imodium) at home.  We reviewed how to take this medication PRN. Neutropenia: we discussed the importance of having a thermometer and what the Centers for Disease Control and Prevention (CDC) considers a fever which is 100.54F (38C) or higher.  Gave patient 24/7 triage line to call if any fevers or symptoms. ILD/Pneumonitis: we reviewed potential symptoms including cough, shortness, and fatigue.  VTE: reviewed signs of DVT such as leg swelling, redness, pain, or tenderness and signs of PE such as shortness of breath, rapid or irregular heartbeat, cough, chest pain, or lightheadedness. Reviewed to take the medication every 12 hours (with food sometimes can be easier on the stomach) and to take it at the same time every day. Hepatotoxicity: WNL Monitor weight  Dosing Assessment Hepatic adjustments needed? No  Renal adjustments needed? No  Toxicity adjustments needed? No  The current dosing regimen is appropriate to continue at this time.  Follow-Up Plan Continue abemaciclib 50 mg by mouth every 12 hours -- refill sent to [facility] Continue letrozole 2.5 mg by mouth daily Labs, pharmacy clinic visit, on 12/09/22 Labs, Dr. Pamelia Hoit visit, on 11/02/22 May use loperamide for loose stool May use ondansetron for nausea Monitor weight and ANC  [name] participated in the discussion, expressed understanding, and voiced agreement with the above plan. All questions were answered to her satisfaction. The patient was advised to contact the clinic at [phone] with any  questions or concerns prior to her return visit.   I spent 30 minutes assessing and educating the patient.  Kaia Depaolis A. Odetta Pink, PharmD, BCOP, CPP  Anselm Lis, RPH-CPP, 10/21/2022  11:56 AM   **Disclaimer: This note was dictated with voice recognition software. Similar sounding words can inadvertently be transcribed and this note may contain transcription errors which may not have been corrected upon publication of note.**

## 2022-10-22 ENCOUNTER — Telehealth: Payer: Self-pay | Admitting: Pharmacist

## 2022-10-22 NOTE — Telephone Encounter (Signed)
Scheduled appointments per los. Talked with the patients daughter Waynetta Sandy and she is aware of the made appointments for the patient.

## 2022-10-25 ENCOUNTER — Ambulatory Visit (INDEPENDENT_AMBULATORY_CARE_PROVIDER_SITE_OTHER): Payer: Medicare Other | Admitting: Family

## 2022-10-25 ENCOUNTER — Encounter: Payer: Self-pay | Admitting: Family

## 2022-10-25 DIAGNOSIS — Z0000 Encounter for general adult medical examination without abnormal findings: Secondary | ICD-10-CM | POA: Diagnosis not present

## 2022-10-25 NOTE — Progress Notes (Addendum)
Subjective:   [name] is a 89 y.o. female who presents for Medicare Annual (Subsequent) preventive examination.  Review of Systems     Cardiac Risk Factors include: advanced age (>39men, >65 women)     Objective:    There were no vitals filed for this visit. There is no height or weight on file to calculate BMI.     10/25/2022   12:54 PM 10/04/2022    1:40 PM 09/28/2022   10:16 PM 09/03/2022   12:16 PM 09/02/2022    1:55 PM 07/30/2022    3:21 PM 06/04/2022    2:00 PM  Advanced Directives  Does Patient Have a Medical Advance Directive? Yes Yes No Yes Yes No Yes  Type of Estate agent of [place];Living will;Out of facility DNR (pink MOST or yellow form) Healthcare Power of [place];Living will;Out of facility DNR (pink MOST or yellow form)  Healthcare Power of [place];Living will Healthcare Power of [place];Living will;Out of facility DNR (pink MOST or yellow form) Healthcare Power of Attorney Living will  Does patient want to make changes to medical advance directive? No - Patient declined No - Patient declined   No - Patient declined  No - Patient declined  Copy of Healthcare Power of Attorney in Chart? No - copy requested No - copy requested  No - copy requested No - copy requested No - copy requested   Would patient like information on creating a medical advance directive?   No - Patient declined   No - Patient declined No - Patient declined    Current Medications (verified) Outpatient Encounter Medications as of 10/25/2022  Medication Sig   abemaciclib (VERZENIO) 50 MG tablet Take 1 tablet (50 mg total) by mouth 2 (two) times daily.   aspirin EC 81 MG tablet Take 1 tablet (81 mg total) by mouth daily. Swallow whole.   letrozole (FEMARA) 2.5 MG tablet Take 1 tablet (2.5 mg total) by mouth daily.   Multiple Vitamin (MULTIVITAMIN PO) Take 1 tablet by mouth daily.   pravastatin (PRAVACHOL) 80 MG tablet Take 1 tablet (80 mg total) by mouth daily at 6 PM.    UNABLE TO FIND Med Name: Renewlife Women's Care Probiotic   ondansetron (ZOFRAN-ODT) 8 MG disintegrating tablet Take 1 tablet (8 mg total) by mouth every 8 (eight) hours as needed for nausea or vomiting. (Patient not taking: Reported on 10/04/2022)   Sennosides 15 MG TABS Take 1 tablet by mouth as needed. (Patient not taking: Reported on 10/04/2022)   No facility-administered encounter medications on file as of 10/25/2022.    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   Past Surgical History:  Procedure Laterality Date   BREAST BIOPSY Right 06/29/2022   [place] RT BREAST BX W LOC DEV 1ST LESION IMG BX SPEC US GUIDE 06/29/2022 [facility]   CORONARY ANGIOPLASTY WITH STENT PLACEMENT  11/21/2007   stenting of the RCA followed by staged LAD intervention   CORONARY ARTERY BYPASS GRAFT  11/30/2007  LIMA to LAD,SVG to 1st and 2nd diagonal arteries   NM MYOVIEW LTD  07/13/2010   normal   Family History  Problem Relation Age of Onset   Diabetes Mother    Heart failure Mother    Stroke Mother    Heart failure Father    Diabetes Son    Diabetes Son    Social History   Socioeconomic History   Marital status: Widowed    Spouse name: Not on file   Number of children: Not on file   Years of education: Not on file   Highest education level: Not on file  Occupational History   Not on file  Tobacco Use   Smoking status: Never   Smokeless tobacco: Never  Substance and Sexual Activity   Alcohol use: No    Alcohol/week: 0.0 standard drinks of alcohol   Drug use: No   Sexual  activity: Not on file  Other Topics Concern   Not on file  Social History Narrative   Tobacco use, amount per day now:   Past tobacco use, amount per day:   How many years did you use tobacco:   Alcohol use (drinks per week):   Diet:   Do you drink/eat things with caffeine: Moderation   Marital status:    Widow                              What year were you married? 1949   Do you live in a house, apartment, assisted living, condo, trailer, etc.? Home   Is it one or more stories? Ranch Style   How many persons live in your home? 2   Do you have pets in your home?( please list) Cats (3)   Highest Level of education completed? 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 Work around house                                 Type and how often? Minimal daily.   Do you have a living will? Yes   Do you have a DNR form?  Yes                                 If not, do you want to discuss one?   Do you have signed POA/HPOA forms?  Yes                      If so, please bring to you appointment      Do you have any difficulty bathing or dressing yourself? No   Do you have any difficulty preparing food or eating? No   Do you have any difficulty managing your medications? Yes   Do you have any difficulty managing your finances? Yes   Do you have any difficulty affording your medications? No    Social Determinants of Health   Financial Resource Strain: Not on file  Food Insecurity: No Food Insecurity (07/30/2022)   Hunger Vital Sign    Worried About Running Out of Food in the Last Year: Never true    Ran Out of Food in the Last Year: Never true  Transportation Needs: No Transportation Needs (07/30/2022)   PRAPARE - Transportation  Lack of Transportation (Medical): No    Lack of Transportation (Non-Medical): No  Physical Activity: Not on file  Stress: Not on file  Social Connections: Not on file    Tobacco Counseling Counseling given: Not  Answered   Clinical Intake:  Pre-visit preparation completed: No  Pain : No/denies pain     BMI - recorded: 20.67 Nutritional Status: BMI of 19-24  Normal Nutritional Risks: None Diabetes: No  How often do you need to have someone help you when you read instructions, pamphlets, or other written materials from your doctor or pharmacy?: 1 - Never What is the last grade level you completed in school?: 12 Grade  Diabetic?No   Interpreter Needed?: No      Activities of Daily Living    10/25/2022    1:20 PM 10/25/2022   12:55 PM  In your present state of health, do you have any difficulty performing the following activities:  Hearing? 0 0  Vision? 0 0  Difficulty concentrating or making decisions? 0 0  Walking or climbing stairs? 0 1  Dressing or bathing? 0 0  Doing errands, shopping? 1 0  Comment daughter Advice worker and eating ? N N  Using the Toilet? N N  In the past six months, have you accidently leaked urine? N N  Do you have problems with loss of bowel control? N N  Managing your Medications? Malvin Johns  Comment daughter assist   Managing your Finances? Malvin Johns  Comment daughter assist   Housekeeping or managing your Housekeeping? Malvin Johns  Comment daughter assist     Patient Care Team: [name], NP as PCP - General (Adult Health Nurse Practitioner) Serena Croissant, MD as Consulting Physician (Hematology and Oncology) Pershing Proud, RN as Oncology Nurse Navigator Donnelly Angelica, RN as Oncology Nurse Navigator Carman Ching, OD (Optometry) Croitoru, Rachelle Hora, MD as Consulting Physician (Cardiology) Serena Croissant, MD as Consulting Physician (Hematology and Oncology) Nita Sells, MD (Dermatology)  Indicate any recent Medical Services you may have received from other than [facility] providers in the past year (date may be approximate).     Assessment:   This is a routine wellness examination for Charl[place].  Hearing/Vision screen No results found.  Dietary  issues and exercise activities discussed: Current Exercise Habits: Home exercise routine, Type of exercise: Other - see comments (riding the Bike), Time (Minutes): 30, Frequency (Times/Week): 5, Weekly Exercise (Minutes/Week): 150, Intensity: Mild, Exercise limited by: None identified   Goals Addressed             This Visit's Progress    exercise       Would like to continue to exercise      Exercise 150 min/wk Moderate Activity       Ride stationary Bike daily for 30 minutes        Depression Screen    10/25/2022   12:54 PM  PHQ 2/9 Scores  PHQ - 2 Score 0    Fall Risk    10/25/2022   12:54 PM 10/04/2022    1:40 PM 09/02/2022    1:54 PM  Fall Risk   Falls in the past year? 1 0 1  Number falls in past yr: 0 0 0  Injury with Fall? 0 0 1  Risk for fall due to : History of fall(s) No Fall Risks History of fall(s);Impaired balance/gait;Impaired mobility  Follow up  Falls evaluation completed Falls evaluation completed;Education provided;Falls prevention discussed  FALL RISK PREVENTION PERTAINING TO THE HOME:  Any stairs in or around the home? No  If so, are there any without handrails? No  Home free of loose throw rugs in walkways, pet beds, electrical cords, etc? No  Adequate lighting in your home to reduce risk of falls? Yes   ASSISTIVE DEVICES UTILIZED TO PREVENT FALLS:  Life alert? No  Use of a cane, walker or w/c? Yes  Grab bars in the bathroom? Yes  Shower chair or bench in shower? Yes  Elevated toilet seat or a handicapped toilet? Yes   TIMED UP AND GO:  Was the test performed? No .  Length of time to ambulate 10 feet: N/A sec.   Gait slow and steady with assistive device  Cognitive Function:        10/25/2022   12:56 PM  6CIT Screen  What Year? 4 points  What month? 0 points  What time? 0 points  Count back from 20 0 points  Months in reverse 4 points  Repeat phrase 6 points  Total Score 14 points    Immunizations  There is no  immunization history on file for this patient.  TDAP status: Due, Education has been provided regarding the importance of this vaccine. Advised may receive this vaccine at local pharmacy or Health Dept. Aware to provide a copy of the vaccination record if obtained from local pharmacy or Health Dept. Verbalized acceptance and understanding.  Flu Vaccine status: Up to date  Pneumococcal vaccine status: Due, Education has been provided regarding the importance of this vaccine. Advised may receive this vaccine at local pharmacy or Health Dept. Aware to provide a copy of the vaccination record if obtained from local pharmacy or Health Dept. Verbalized acceptance and understanding.  Covid-19 vaccine status: Declined, Education has been provided regarding the importance of this vaccine but patient still declined. Advised may receive this vaccine at local pharmacy or Health Dept.or vaccine clinic. Aware to provide a copy of the vaccination record if obtained from local pharmacy or Health Dept. Verbalized acceptance and understanding.  Qualifies for Shingles Vaccine? Yes   Zostavax completed No   Shingrix Completed?: No.    Education has been provided regarding the importance of this vaccine. Patient has been advised to call insurance company to determine out of pocket expense if they have not yet received this vaccine. Advised may also receive vaccine at local pharmacy or Health Dept. Verbalized acceptance and understanding.  Screening Tests Health Maintenance  Topic Date Due   DTaP/Tdap/Td (1 - Tdap) Never done   Pneumonia Vaccine 24+ Years old (1 of 1 - PCV) Never done   COVID-19 Vaccine (1) 11/10/2022 (Originally 08/19/1932)   Zoster Vaccines- Shingrix (1 of 2) 10/01/2023 (Originally 08/19/1946)   DEXA SCAN  10/25/2023 (Originally [birth year]/19/1994)   INFLUENZA VACCINE  01/27/2023   Medicare Annual Wellness (AWV)  10/25/2023   HPV VACCINES  Aged Out    Health Maintenance  Health Maintenance Due  Topic  Date Due   DTaP/Tdap/Td (1 - Tdap) Never done   Pneumonia Vaccine 87+ Years old (1 of 1 - PCV) Never done    Colorectal cancer screening: No longer required.   Mammogram status: No longer required due to Advance age .  Bone Density status: Ordered N/A. Pt provided with contact info and advised to call to schedule appt.  Lung Cancer Screening: (Low Dose CT Chest recommended if Age 20-80 years, 30 pack-year currently smoking OR have quit w/in 15years.) does not qualify.  Lung Cancer Screening Referral: No   Additional Screening:  Hepatitis C Screening: does not qualify; Completed No   Vision Screening: Recommended annual ophthalmology exams for early detection of glaucoma and other disorders of the eye. Is the patient up to date with their annual eye exam?  No  Who is the provider or what is the name of the office in which the patient attends annual eye exams? Could not recall Provider's name or practice.Daughter Waynetta Sandy will schedule appointment  If pt is not established with a provider, would they like to be referred to a provider to establish care? No .   Dental Screening: Recommended annual dental exams for proper oral hygiene  Community Resource Referral / Chronic Care Management: CRR required this visit?  No   CCM required this visit?  No      Plan:     I have personally reviewed and noted the following in the patient's chart:   Medical and social history Use of alcohol, tobacco or illicit drugs  Current medications and supplements including opioid prescriptions. Patient is not currently taking opioid prescriptions. Functional ability and status Nutritional status Physical activity Advanced directives List of other physicians Hospitalizations, surgeries, and ER visits in previous 12 months Vitals Screenings to include cognitive, depression, and falls Referrals and appointments  In addition, I have reviewed and discussed with patient certain preventive protocols,  quality metrics, and best practice recommendations. A written personalized care plan for preventive services as well as general preventive health recommendations were provided to patient.  Nurse Notes: Declined Vaccines.Advised to get Tdap and PNA vaccine at the Pharmacy but declined.Stated does not need vaccine.   This service is provided via telemedicine  No vital signs collected/recorded due to the encounter was a telemedicine visit.   Location of patient (ex: home, work):  Home   Patient consents to a telephone visit:  yes  Location of the provider (ex: office, home):  [facility] office   Name of any referring provider:  Richarda Blade ,FNP - [facility]  I connected with Nat Math.Luckman on 10/25/2022 by a video enabled telemedicine application and verified that I am speaking with the correct person using two identifiers.   I discussed the limitations of evaluation and management by telemedicine. The patient expressed understanding and agreed to proceed.   Caesar Bookman, NP   10/25/2022    .

## 2022-10-25 NOTE — Patient Instructions (Signed)
[name] , Thank you for taking time to come for your Medicare Wellness Visit. I appreciate your ongoing commitment to your health goals. Please review the following plan we discussed and let me know if I can assist you in the future.   Screening recommendations/referrals: Colonoscopy : N/A  Mammogram : N/A  Bone Density : Up to date  Recommended yearly ophthalmology/optometry visit for glaucoma screening and checkup Recommended yearly dental visit for hygiene and checkup  Vaccinations: Influenza vaccine- due annually in September/October Pneumococcal vaccine ; Due  Tdap vaccine : Due Please get vaccine at the Pharmacy  Shingles vaccine Due Please get vaccine at the Pharmacy    Advanced directives: Yes   Conditions/risks identified: advanced age (>55men, >71 women)  Next appointment: 1 year    Preventive Care 87 Years and Older, Female Preventive care refers to lifestyle choices and visits with your health care provider that can promote health and wellness. What does preventive care include? A yearly physical exam. This is also called an annual well check. Dental exams once or twice a year. Routine eye exams. Ask your health care provider how often you should have your eyes checked. Personal lifestyle choices, including: Daily care of your teeth and gums. Regular physical activity. Eating a healthy diet. Avoiding tobacco and drug use. Limiting alcohol use. Practicing safe sex. Taking low-dose aspirin every day. Taking vitamin and mineral supplements as recommended by your health care provider. What happens during an annual well check? The services and screenings done by your health care provider during your annual well check will depend on your age, overall health, lifestyle risk factors, and family history of disease. Counseling  Your health care provider may ask you questions about your: Alcohol use. Tobacco use. Drug use. Emotional well-being. Home and relationship  well-being. Sexual activity. Eating habits. History of falls. Memory and ability to understand (cognition). Work and work Astronomer. Reproductive health. Screening  You may have the following tests or measurements: Height, weight, and BMI. Blood pressure. Lipid and cholesterol levels. These may be checked every 5 years, or more frequently if you are over 56 years old. Skin check. Lung cancer screening. You may have this screening every year starting at age 68 if you have a 30-pack-year history of smoking and currently smoke or have quit within the past 15 years. Fecal occult blood test (FOBT) of the stool. You may have this test every year starting at age 33. Flexible sigmoidoscopy or colonoscopy. You may have a sigmoidoscopy every 5 years or a colonoscopy every 10 years starting at age 47. Hepatitis C blood test. Hepatitis B blood test. Sexually transmitted disease (STD) testing. Diabetes screening. This is done by checking your blood sugar (glucose) after you have not eaten for a while (fasting). You may have this done every 1-3 years. Bone density scan. This is done to screen for osteoporosis. You may have this done starting at age 77. Mammogram. This may be done every 1-2 years. Talk to your health care provider about how often you should have regular mammograms. Talk with your health care provider about your test results, treatment options, and if necessary, the need for more tests. Vaccines  Your health care provider may recommend certain vaccines, such as: Influenza vaccine. This is recommended every year. Tetanus, diphtheria, and acellular pertussis (Tdap, Td) vaccine. You may need a Td booster every 10 years. Zoster vaccine. You may need this after age 4. Pneumococcal 13-valent conjugate (PCV13) vaccine. One dose is recommended after age 26.  Pneumococcal polysaccharide (PPSV23) vaccine. One dose is recommended after age 24. Talk to your health care provider about which  screenings and vaccines you need and how often you need them. This information is not intended to replace advice given to you by your health care provider. Make sure you discuss any questions you have with your health care provider. Document Released: 07/11/2015 Document Revised: 03/03/2016 Document Reviewed: 04/15/2015 Elsevier Interactive Patient Education  2017 [place] Prevention in the Home Falls can cause injuries. They can happen to people of all ages. There are many things you can do to make your home safe and to help prevent falls. What can I do on the outside of my home? Regularly fix the edges of walkways and driveways and fix any cracks. Remove anything that might make you trip as you walk through a door, such as a raised step or threshold. Trim any bushes or trees on the path to your home. Use bright outdoor lighting. Clear any walking paths of anything that might make someone trip, such as rocks or tools. Regularly check to see if handrails are loose or broken. Make sure that both sides of any steps have handrails. Any raised decks and porches should have guardrails on the edges. Have any leaves, snow, or ice cleared regularly. Use sand or salt on walking paths during winter. Clean up any spills in your garage right away. This includes oil or grease spills. What can I do in the bathroom? Use night lights. Install grab bars by the toilet and in the tub and shower. Do not use towel bars as grab bars. Use non-skid mats or decals in the tub or shower. If you need to sit down in the shower, use a plastic, non-slip stool. Keep the floor dry. Clean up any water that spills on the floor as soon as it happens. Remove soap buildup in the tub or shower regularly. Attach bath mats securely with double-sided non-slip rug tape. Do not have throw rugs and other things on the floor that can make you trip. What can I do in the bedroom? Use night lights. Make sure that you have a  light by your bed that is easy to reach. Do not use any sheets or blankets that are too big for your bed. They should not hang down onto the floor. Have a firm chair that has side arms. You can use this for support while you get dressed. Do not have throw rugs and other things on the floor that can make you trip. What can I do in the kitchen? Clean up any spills right away. Avoid walking on wet floors. Keep items that you use a lot in easy-to-reach places. If you need to reach something above you, use a strong step stool that has a grab bar. Keep electrical cords out of the way. Do not use floor polish or wax that makes floors slippery. If you must use wax, use non-skid floor wax. Do not have throw rugs and other things on the floor that can make you trip. What can I do with my stairs? Do not leave any items on the stairs. Make sure that there are handrails on both sides of the stairs and use them. Fix handrails that are broken or loose. Make sure that handrails are as long as the stairways. Check any carpeting to make sure that it is firmly attached to the stairs. Fix any carpet that is loose or worn. Avoid having throw rugs at the  top or bottom of the stairs. If you do have throw rugs, attach them to the floor with carpet tape. Make sure that you have a light switch at the top of the stairs and the bottom of the stairs. If you do not have them, ask someone to add them for you. What else can I do to help prevent falls? Wear shoes that: Do not have high heels. Have rubber bottoms. Are comfortable and fit you well. Are closed at the toe. Do not wear sandals. If you use a stepladder: Make sure that it is fully opened. Do not climb a closed stepladder. Make sure that both sides of the stepladder are locked into place. Ask someone to hold it for you, if possible. Clearly mark and make sure that you can see: Any grab bars or handrails. First and last steps. Where the edge of each step  is. Use tools that help you move around (mobility aids) if they are needed. These include: Canes. Walkers. Scooters. Crutches. Turn on the lights when you go into a dark area. Replace any light bulbs as soon as they burn out. Set up your furniture so you have a clear path. Avoid moving your furniture around. If any of your floors are uneven, fix them. If there are any pets around you, be aware of where they are. Review your medicines with your doctor. Some medicines can make you feel dizzy. This can increase your chance of falling. Ask your doctor what other things that you can do to help prevent falls. This information is not intended to replace advice given to you by your health care provider. Make sure you discuss any questions you have with your health care provider. Document Released: 04/10/2009 Document Revised: 11/20/2015 Document Reviewed: 07/19/2014 Elsevier Interactive Patient Education  2017 [employer].

## 2022-10-26 ENCOUNTER — Encounter: Payer: Self-pay | Admitting: Emergency Medicine

## 2022-10-28 ENCOUNTER — Ambulatory Visit: Payer: Medicare Other | Admitting: Orthopedic Surgery

## 2022-11-01 ENCOUNTER — Ambulatory Visit: Payer: Medicare Other | Admitting: Orthopedic Surgery

## 2022-11-04 ENCOUNTER — Encounter: Payer: Self-pay | Admitting: Orthopedic Surgery

## 2022-11-04 ENCOUNTER — Ambulatory Visit (INDEPENDENT_AMBULATORY_CARE_PROVIDER_SITE_OTHER): Payer: Medicare Other | Admitting: Orthopedic Surgery

## 2022-11-04 VITALS — BP 122/90 | HR 98 | Temp 97.3°F | Resp 16 | Ht 65.0 in | Wt 120.2 lb

## 2022-11-04 DIAGNOSIS — N6310 Unspecified lump in the right breast, unspecified quadrant: Secondary | ICD-10-CM | POA: Diagnosis not present

## 2022-11-04 DIAGNOSIS — J90 Pleural effusion, not elsewhere classified: Secondary | ICD-10-CM

## 2022-11-04 DIAGNOSIS — R2681 Unsteadiness on feet: Secondary | ICD-10-CM

## 2022-11-04 DIAGNOSIS — C50911 Malignant neoplasm of unspecified site of right female breast: Secondary | ICD-10-CM

## 2022-11-04 DIAGNOSIS — J910 Malignant pleural effusion: Secondary | ICD-10-CM

## 2022-11-04 DIAGNOSIS — R634 Abnormal weight loss: Secondary | ICD-10-CM

## 2022-11-04 DIAGNOSIS — E782 Mixed hyperlipidemia: Secondary | ICD-10-CM

## 2022-11-04 DIAGNOSIS — I2510 Atherosclerotic heart disease of native coronary artery without angina pectoris: Secondary | ICD-10-CM | POA: Diagnosis not present

## 2022-11-04 NOTE — Patient Instructions (Addendum)
Try [place] creamery chocolate milk

## 2022-11-04 NOTE — Progress Notes (Addendum)
Careteam: Patient Care Team: Lynn Heir, NP as PCP - General (Adult Health Nurse Practitioner) Lynn Croissant, MD as Consulting Physician (Hematology and Oncology) Lynn Proud, RN as Oncology Nurse Navigator Lynn Angelica, RN as Oncology Nurse Navigator [name], OD (Optometry) [name], [place], MD as Consulting Physician (Cardiology) Lynn Croissant, MD as Consulting Physician (Hematology and Oncology) Lynn Sells, MD (Dermatology)  Seen by: Hazle Nordmann, AGNP-C  PLACE OF SERVICE:  [facility]  Advanced Directive information Does Patient Have a Medical Advance Directive?: Yes, Type of Advance Directive: Healthcare Power of [place];Living will;Out of facility DNR (pink MOST or yellow form), Does patient want to make changes to medical advance directive?: No - Patient declined  Allergies  Allergen Reactions   Lipitor [Atorvastatin]    Simvastatin     Chief Complaint  Patient presents with   Medical Management of Chronic Issues    4 week follow up   Immunizations    Discuss the need for DTAP vaccine.      HPI: Patient is a 89 y.o. female seen today for medical management of chonic   Daughter present during encounter.   Malignant pleural effusion/ mass of right breast- followed by Dr. 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 Meningioma  to brain, remains on letrozole and abemaciclib.   Intermittent weakness. Ambulating with walker. No recent falls. PT ended yesterday.   Weight down 4 lbs. Snacking during day. Family has tried supplement shakes.  Cardiology recommending lipid panel> she is fasting today.   Does not want tetanus vaccine at this time.   Review of Systems:  Review of Systems  Constitutional:  Positive for weight loss. Negative for chills and fever.  HENT:  Negative for congestion and sore throat.   Eyes:  Negative for blurred vision.  Respiratory:   Negative for cough, sputum production, shortness of breath and wheezing.   Cardiovascular:  Negative for chest pain, palpitations and leg swelling.  Gastrointestinal:  Negative for abdominal pain, blood in stool, constipation, diarrhea, heartburn, nausea and vomiting.  Genitourinary:  Negative for dysuria and frequency.  Musculoskeletal:  Negative for falls and joint pain.  Skin:  Negative for rash.  Neurological:  Positive for weakness. Negative for dizziness.  Psychiatric/Behavioral:  Negative for depression. The patient is not nervous/anxious.     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   Past Surgical History:  Procedure Laterality Date   BREAST BIOPSY Right 06/29/2022   [place] RT BREAST BX W LOC DEV 1ST LESION IMG BX SPEC US GUIDE 06/29/2022 [facility] MAMMOGRAPHY   CORONARY ANGIOPLASTY WITH STENT PLACEMENT  11/21/2007   stenting of the RCA followed by staged LAD intervention   CORONARY ARTERY BYPASS GRAFT  11/30/2007   LIMA to LAD,SVG to 1st and 2nd diagonal arteries   NM MYOVIEW LTD  07/13/2010   normal   Social History:   reports that she has never smoked. She has never used smokeless tobacco. She reports that she  does not drink alcohol and does not use drugs.  Family History  Problem Relation Age of Onset   Diabetes Mother    Heart failure Mother    Stroke Mother    Heart failure Father    Diabetes Son    Diabetes Son     Medications: Patient's Medications  New Prescriptions   No medications on file  Previous Medications   ABEMACICLIB (VERZENIO) 50 MG TABLET    Take 1 tablet (50 mg total) by mouth 2 (two) times daily.   ASPIRIN EC  81 MG TABLET    Take 1 tablet (81 mg total) by mouth daily. Swallow whole.   LETROZOLE (FEMARA) 2.5 MG TABLET    Take 1 tablet (2.5 mg total) by mouth daily.   MULTIPLE VITAMIN (MULTIVITAMIN PO)    Take 1 tablet by mouth daily.   PRAVASTATIN (PRAVACHOL) 80 MG TABLET    Take 1 tablet (80 mg total) by mouth daily at 6 PM.   UNABLE TO FIND    Med Name: Renewlife [facility] Probiotic  Modified Medications   No medications on file  Discontinued Medications   ONDANSETRON (ZOFRAN-ODT) 8 MG DISINTEGRATING TABLET    Take 1 tablet (8 mg total) by mouth every 8 (eight) hours as needed for nausea or vomiting.   SENNOSIDES 15 MG TABS    Take 1 tablet by mouth as needed.    Physical Exam:  Vitals:   11/04/22 1413  BP: (!) 122/90  Pulse: 98  Resp: 16  Temp: (!) 97.3 F (36.3 C)  SpO2: 90%  Weight: 120 lb 3.2 oz (54.5 kg)  Height: 5\' 5"  (1.651 m)   Body mass index is 20 kg/m. Wt Readings from Last 3 Encounters:  11/04/22 120 lb 3.2 oz (54.5 kg)  10/21/22 121 lb 14.4 oz (55.3 kg)  09/28/22 125 lb (56.7 kg)    Physical Exam Vitals reviewed.  Constitutional:      General: She is not in acute distress. HENT:     Head: Normocephalic.  Eyes:     General:        Right eye: No discharge.        Left eye: No discharge.  Cardiovascular:     Rate and Rhythm: Normal rate and regular rhythm.     Pulses: Normal pulses.     Heart sounds: Normal heart sounds.  Pulmonary:     Effort: Pulmonary effort is normal. No respiratory distress.     Breath sounds: Normal breath sounds. No wheezing.  Abdominal:     General: Bowel sounds are normal. There is no distension.     Palpations: Abdomen is soft.     Tenderness: There is no abdominal tenderness.  Musculoskeletal:     Cervical back: Neck supple.     Right lower leg: No edema.     Left lower leg: No edema.  Skin:    General: Skin is warm.     Capillary Refill: Capillary refill takes less than 2 seconds.  Neurological:     General: No focal  deficit present.     Mental Status: She is alert and oriented to person, place, and time.     Motor: Weakness present.     Gait: Gait abnormal.  Psychiatric:        Mood and Affect: Mood normal.     Labs reviewed: Basic Metabolic Panel: Recent Labs    07/31/22 0443 09/28/22 2240 10/21/22 1051  NA 139 136 140  K 3.3* 4.1  3.9  CL 110 101 107  CO2 21* 24 27  GLUCOSE 114* 107* 105*  BUN 8 12 12   CREATININE 0.72 0.82 0.93  CALCIUM 8.2* 10.0 9.4  MG 2.0  --   --    Liver Function Tests: Recent Labs    07/30/22 1030 09/28/22 2240 10/21/22 1051  AST 44* 32 20  ALT 36 26 13  ALKPHOS 98 148* 114  BILITOT 0.9 1.1 0.7  PROT 6.5 8.4* 7.4  ALBUMIN 3.4* 4.5 3.9   No results for input(s): "LIPASE", "AMYLASE" in the last 8760 hours. No results for input(s): "AMMONIA" in the last 8760 hours. CBC: Recent Labs    07/31/22 0443 09/28/22 2240 10/21/22 1051  WBC 6.9 4.3 3.5*  NEUTROABS 4.6 3.4 1.9  HGB 12.4 15.0 13.6  HCT 36.2 46.1* 41.1  MCV 89.8 91.1 92.2  PLT 181 199 181   Lipid Panel: Recent Labs    06/06/22 0438  CHOL 279*  HDL 42  LDLCALC 217*  TRIG 101  CHOLHDL 6.6   TSH: No results for input(s): "TSH" in the last 8760 hours. A1C: Lab Results  Component Value Date   HGBA1C 6.4 (H) 06/06/2022     Assessment/Plan 1. Mass of right breast, unspecified quadrant - followed by oncology/ Dr. 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 Malignant pleural effusion - 05/2022 CT CAP large right pleural effusion - see above  3. Coronary artery disease involving native coronary artery of native heart without angina pectoris - cont asa and statin  4. Mixed hyperlipidemia - lipid panel recommended by cardiology> results forwarded  - Lipid Panel> total 235, LDL 151 11/04/2022   5. Unstable gait - no recent falls - cont falls precautions  6. Weight  loss - BMI 20 - down 4 lbs - cont supplement shakes - Protein 7.4, albumin 3.9 10/21/2022  Total time: 32 minutes. Greater than 50% of total time spent doing patient education regarding health maintenance, falls safety, weight gain, and current breast cancer including symptom/medication management.    Next appt: none [name] Scherry Ran  [facility] [phone]

## 2022-11-05 LAB — LIPID PANEL
Cholesterol: 235 mg/dL — ABNORMAL HIGH (ref <200)
HDL: 55 mg/dL (ref >=50)
LDL Cholesterol (Calc): 151 mg/dL (calc) — ABNORMAL HIGH
Non-HDL Cholesterol (Calc): 180 mg/dL (calc) — ABNORMAL HIGH (ref <130)
Total CHOL/HDL Ratio: 4.3 (calc) (ref <5.0)
Triglycerides: 158 mg/dL — ABNORMAL HIGH (ref <150)

## 2022-11-10 NOTE — Progress Notes (Signed)
Patient Care Team: [name], NP as PCP - General (Adult Health Nurse Practitioner) Serena Croissant, MD as Consulting Physician (Hematology and Oncology) Pershing Proud, RN as Oncology Nurse Navigator Donnelly Angelica, RN as Oncology Nurse Navigator Carman Ching, OD (Optometry) Croitoru, Rachelle Hora, MD as Consulting Physician (Cardiology) Serena Croissant, MD as Consulting Physician (Hematology and Oncology) Nita Sells, MD (Dermatology)  DIAGNOSIS: No diagnosis found.  SUMMARY OF ONCOLOGIC HISTORY: Oncology History   No history exists.    CHIEF COMPLIANT: Metastatic cancer   INTERVAL HISTORY: [name] is a 89 y.o. female is here because of recent diagnosis of pleural effusion with lymphadenopathy and a breast mass. Currently on letrozole. She presents to the clinic for a follow-up.   ALLERGIES:  is allergic to lipitor [atorvastatin] and simvastatin.  MEDICATIONS:  Current Outpatient Medications  Medication Sig Dispense Refill   abemaciclib (VERZENIO) 50 MG tablet Take 1 tablet (50 mg total) by mouth 2 (two) times daily. 56 tablet 5   aspirin EC 81 MG tablet Take 1 tablet (81 mg total) by mouth daily. Swallow whole. 90 tablet 2   letrozole (FEMARA) 2.5 MG tablet Take 1 tablet (2.5 mg total) by mouth daily. 90 tablet 3   Multiple Vitamin (MULTIVITAMIN PO) Take 1 tablet by mouth daily.     pravastatin (PRAVACHOL) 80 MG tablet Take 1 tablet (80 mg total) by mouth daily at 6 PM. 90 tablet 3   UNABLE TO FIND Med Name: Renewlife Women's Care Probiotic     No current facility-administered medications for this visit.    PHYSICAL EXAMINATION: ECOG PERFORMANCE STATUS: {CHL ONC ECOG PS:[phone]}  There were no vitals filed for this visit. There were no vitals filed for this visit.  BREAST:*** No palpable masses or nodules in either right or left breasts. No palpable axillary supraclavicular or infraclavicular adenopathy no breast tenderness or nipple discharge. (exam performed  in the presence of a chaperone)  LABORATORY DATA:  I have reviewed the data as listed    Latest Ref Rng & Units 10/21/2022   10:51 AM 09/28/2022   10:40 PM 07/31/2022    4:43 AM  CMP  Glucose 70 - 99 mg/dL 409  811  914   BUN 8 - 23 mg/dL 12  12  8    Creatinine 0.44 - 1.00 mg/dL 7.82  9.56  2.13   Sodium 135 - 145 mmol/L 140  136  139   Potassium 3.5 - 5.1 mmol/L 3.9  4.1  3.3   Chloride 98 - 111 mmol/L 107  101  110   CO2 22 - 32 mmol/L 27  24  21    Calcium 8.9 - 10.3 mg/dL 9.4  08.6  8.2   Total Protein 6.5 - 8.1 g/dL 7.4  8.4    Total Bilirubin 0.3 - 1.2 mg/dL 0.7  1.1    Alkaline Phos 38 - 126 U/L 114  148    AST 15 - 41 U/L 20  32    ALT 0 - 44 U/L 13  26      Lab Results  Component Value Date   WBC 3.5 (L) 10/21/2022   HGB 13.6 10/21/2022   HCT 41.1 10/21/2022   MCV 92.2 10/21/2022   PLT 181 10/21/2022   NEUTROABS 1.9 10/21/2022    ASSESSMENT & PLAN:  No problem-specific Assessment & Plan notes found for this encounter.    No orders of the defined types were placed in this encounter.  The patient has  a good understanding of the overall plan. she agrees with it. she will call with any problems that may develop before the next visit here. Total time spent: 30 mins including face to face time and time spent for planning, charting and co-ordination of care   Sherlyn Lick, CMA 11/10/22    I Janan Ridge am acting as a Neurosurgeon for [employer]  ***

## 2022-11-11 ENCOUNTER — Other Ambulatory Visit: Payer: Self-pay

## 2022-11-11 ENCOUNTER — Inpatient Hospital Stay: Payer: Medicare Other | Attending: Hematology and Oncology | Admitting: Hematology and Oncology

## 2022-11-11 ENCOUNTER — Inpatient Hospital Stay: Payer: Medicare Other

## 2022-11-11 VITALS — BP 115/80 | HR 110 | Temp 97.7°F | Resp 18 | Ht 65.0 in | Wt 119.4 lb

## 2022-11-11 DIAGNOSIS — C7931 Secondary malignant neoplasm of brain: Secondary | ICD-10-CM | POA: Insufficient documentation

## 2022-11-11 DIAGNOSIS — N6310 Unspecified lump in the right breast, unspecified quadrant: Secondary | ICD-10-CM

## 2022-11-11 DIAGNOSIS — Z79811 Long term (current) use of aromatase inhibitors: Secondary | ICD-10-CM | POA: Diagnosis not present

## 2022-11-11 DIAGNOSIS — C50911 Malignant neoplasm of unspecified site of right female breast: Secondary | ICD-10-CM | POA: Diagnosis present

## 2022-11-11 LAB — CMP (CANCER CENTER ONLY)
ALT: 15 U/L (ref 0–44)
AST: 26 U/L (ref 15–41)
Albumin: 4.3 g/dL (ref 3.5–5.0)
Alkaline Phosphatase: 112 U/L (ref 38–126)
Anion gap: 9 (ref 5–15)
BUN: 10 mg/dL (ref 8–23)
CO2: 27 mmol/L (ref 22–32)
Calcium: 9.5 mg/dL (ref 8.9–10.3)
Chloride: 105 mmol/L (ref 98–111)
Creatinine: 0.87 mg/dL (ref 0.44–1.00)
GFR, Estimated: >60 mL/min (ref >60)
Glucose, Bld: 160 mg/dL — ABNORMAL HIGH (ref 70–99)
Potassium: 3.8 mmol/L (ref 3.5–5.1)
Sodium: 141 mmol/L (ref 135–145)
Total Bilirubin: 1 mg/dL (ref 0.3–1.2)
Total Protein: 8 g/dL (ref 6.5–8.1)

## 2022-11-11 LAB — CBC WITH DIFFERENTIAL (CANCER CENTER ONLY)
Abs Immature Granulocytes: 0.01 10*3/uL (ref 0.00–0.07)
Basophils Absolute: 0.1 10*3/uL (ref 0.0–0.1)
Basophils Relative: 1 %
Eosinophils Absolute: 0.1 10*3/uL (ref 0.0–0.5)
Eosinophils Relative: 2 %
HCT: 43.9 % (ref 36.0–46.0)
Hemoglobin: 14.6 g/dL (ref 12.0–15.0)
Immature Granulocytes: 0 %
Lymphocytes Relative: 31 %
Lymphs Abs: 1.5 10*3/uL (ref 0.7–4.0)
MCH: 31 pg (ref 26.0–34.0)
MCHC: 33.3 g/dL (ref 30.0–36.0)
MCV: 93.2 fL (ref 80.0–100.0)
Monocytes Absolute: 0.4 10*3/uL (ref 0.1–1.0)
Monocytes Relative: 9 %
Neutro Abs: 2.7 10*3/uL (ref 1.7–7.7)
Neutrophils Relative %: 57 %
Platelet Count: 256 10*3/uL (ref 150–400)
RBC: 4.71 MIL/uL (ref 3.87–5.11)
RDW: 16.7 % — ABNORMAL HIGH (ref 11.5–15.5)
WBC Count: 4.8 10*3/uL (ref 4.0–10.5)
nRBC: 0 % (ref 0.0–0.2)

## 2022-11-11 NOTE — Assessment & Plan Note (Addendum)
CT CAP 06/03/2022: Large right pleural effusion, marked soft tissue thickening around the right hilum and right infrahilar region, mediastinal hilar and upper abdominal lymph nodes, right breast mass 5 cm   06/06/2022: Brain MRI: 2 small lesions left frontal lobe 3 mm and 4 mm concerning for metastatic disease, additional dural based contrast-enhancing lesion left frontal convexity possibly small meningioma, acute/subacute infarcts involving the left basal ganglia and right centrum semiovale   Hospitalization 06/03/2022-06/07/2022: Fatigue, shortness of breath Thoracentesis 06/04/2022: Negative for malignant cells U/S Right breast: 06/24/22: Rt breast mass: 6.3 cm, 3 LN axilla Rt Breast Biopsy: 06/29/22: Grade 2 IDC with extracellular mucin, lymph node positive for IDC ER 100%, PR 100%, Ki67 25%, HER2 0 negative Hospitalization 07/30/2022-07/31/2022 fall, urine incontinence CT CAP 09/01/2022: Developing multiple hepatic metastases, multiple areas of abnormal lymph node enlargement mediastinum and hilar appears similar, left adrenal metastases, persistent bilateral pleural effusions    Treatment plan:  Palliative treatment with antiestrogen therapy with letrozole 2.5 mg daily: Added Verzinio 50 p.o. twice daily starting 10/21/2022  Verzinio toxicities: Fatigue and decreased appetite: Much improved after they cut down the dosage to 50 mg once at bedtime Mild leukopenia: Improved after reducing the dosage to once a day.  Patient is able to tolerate this dosage and we will keep her at the 50 mg once a day.  Return to clinic in 1 month for follow-up with Jonny Ruiz and after that we can see her every 2 months with labs.

## 2022-12-09 ENCOUNTER — Inpatient Hospital Stay: Payer: Medicare Other | Attending: Hematology and Oncology

## 2022-12-09 ENCOUNTER — Other Ambulatory Visit: Payer: Self-pay

## 2022-12-09 ENCOUNTER — Inpatient Hospital Stay: Payer: Medicare Other | Admitting: Pharmacist

## 2022-12-09 VITALS — BP 136/79 | HR 100 | Temp 97.5°F | Resp 18 | Ht 65.0 in | Wt 117.0 lb

## 2022-12-09 DIAGNOSIS — Z79811 Long term (current) use of aromatase inhibitors: Secondary | ICD-10-CM | POA: Insufficient documentation

## 2022-12-09 DIAGNOSIS — C50911 Malignant neoplasm of unspecified site of right female breast: Secondary | ICD-10-CM | POA: Diagnosis not present

## 2022-12-09 DIAGNOSIS — C7931 Secondary malignant neoplasm of brain: Secondary | ICD-10-CM | POA: Diagnosis present

## 2022-12-09 DIAGNOSIS — N6310 Unspecified lump in the right breast, unspecified quadrant: Secondary | ICD-10-CM

## 2022-12-09 DIAGNOSIS — Z170 Estrogen receptor positive status [ER+]: Secondary | ICD-10-CM | POA: Insufficient documentation

## 2022-12-09 LAB — CMP (CANCER CENTER ONLY)
ALT: 17 U/L (ref 0–44)
AST: 24 U/L (ref 15–41)
Albumin: 4 g/dL (ref 3.5–5.0)
Alkaline Phosphatase: 112 U/L (ref 38–126)
Anion gap: 9 (ref 5–15)
BUN: 9 mg/dL (ref 8–23)
CO2: 26 mmol/L (ref 22–32)
Calcium: 9.7 mg/dL (ref 8.9–10.3)
Chloride: 106 mmol/L (ref 98–111)
Creatinine: 0.72 mg/dL (ref 0.44–1.00)
GFR, Estimated: >60 mL/min (ref >60)
Glucose, Bld: 98 mg/dL (ref 70–99)
Potassium: 4 mmol/L (ref 3.5–5.1)
Sodium: 141 mmol/L (ref 135–145)
Total Bilirubin: 0.9 mg/dL (ref 0.3–1.2)
Total Protein: 8.1 g/dL (ref 6.5–8.1)

## 2022-12-09 LAB — CBC WITH DIFFERENTIAL (CANCER CENTER ONLY)
Abs Immature Granulocytes: 0.01 10*3/uL (ref 0.00–0.07)
Basophils Absolute: 0.1 10*3/uL (ref 0.0–0.1)
Basophils Relative: 1 %
Eosinophils Absolute: 0.1 10*3/uL (ref 0.0–0.5)
Eosinophils Relative: 3 %
HCT: 43.3 % (ref 36.0–46.0)
Hemoglobin: 14.4 g/dL (ref 12.0–15.0)
Immature Granulocytes: 0 %
Lymphocytes Relative: 31 %
Lymphs Abs: 1.7 10*3/uL (ref 0.7–4.0)
MCH: 31.2 pg (ref 26.0–34.0)
MCHC: 33.3 g/dL (ref 30.0–36.0)
MCV: 93.9 fL (ref 80.0–100.0)
Monocytes Absolute: 0.7 10*3/uL (ref 0.1–1.0)
Monocytes Relative: 13 %
Neutro Abs: 2.8 10*3/uL (ref 1.7–7.7)
Neutrophils Relative %: 52 %
Platelet Count: 256 10*3/uL (ref 150–400)
RBC: 4.61 MIL/uL (ref 3.87–5.11)
RDW: 15.8 % — ABNORMAL HIGH (ref 11.5–15.5)
WBC Count: 5.4 10*3/uL (ref 4.0–10.5)
nRBC: 0 % (ref 0.0–0.2)

## 2022-12-09 NOTE — Progress Notes (Signed)
[facility]       Telephone: [phone]?Fax: [phone]   Oncology Clinical Pharmacist Practitioner Progress Note  [name] was contacted via in-person to discuss her chemotherapy regimen for abemaciclib which they receive under the care of Dr. Serena Croissant. She is accompanied by her daughters today.   Current treatment regimen and start date Abemaciclib (09/22/22) Letrozole (07/05/22)   Interval History She continues on abemaciclib 50 mg by mouth daily on days 1 to 28 of a 28-day cycle. This is being given in combination with letrozole . Therapy is planned to continue until disease progression or unacceptable toxicity.  The abemaciclib was reduced to once daily by Dr. Pamelia Hoit after patient was not feeling well on the BID dosing. [name] last visited with clinical pharmacy on 10/21/22 and Dr. Pamelia Hoit on 11/11/22.   Response to Therapy Overall, [name] is doing well. Her labs are good. She does have occasional constipation which she has been using bisacodyl (Dulcolax) for which helps. We discussed that they could also try Senna-S as an alternative. She reports still sleeping a lot but she says she feels good when she wakes up. Usually going to bed around 8 pm and sleeping until 3 pm the next day.  She is due for restaging scans and so we have ordered CT CAP with contrast and she will have a phone visit in 2 weeks to discuss the results. We did discuss that abemaciclib is given every 12 hours and we discussed it may not be as efficacious at a daily dose. Dr. Pamelia Hoit was comfortable having labs and visits every 2 months so he will see her again in 8 weeks and she will follow up with clinical pharmacy in 16 weeks. Labs, vitals, treatment parameters, and manufacturer guidelines assessing toxicity were reviewed with [name] today. Based on these values, patient is in agreement to continue abemaciclib therapy at this time.  Allergies Allergies  Allergen Reactions   Lipitor  [Atorvastatin]    Simvastatin     Vitals    12/09/2022    1:08 PM 11/11/2022    2:28 PM 11/04/2022    2:13 PM  Oncology Vitals  Height 165 cm 165 cm 165 cm  Weight 53.071 kg 54.159 kg 54.522 kg  Weight (lbs) 117 lbs 119 lbs 6 oz 120 lbs 3 oz  BMI 19.47 kg/m2   19.47 kg/m2 19.87 kg/m2   19.87 kg/m2 20 kg/m2   20 kg/m2  Temp 97.5 F (36.4 C) 97.7 F (36.5 C) 97.3 F (36.3 C)  Pulse Rate 100 110 98  BP 136/79 115/80 122/90  Resp 18 18 16   SpO2 94 % 97 % 90 %  BSA (m2) 1.56 m2   1.56 m2 1.58 m2   1.58 m2 1.58 m2   1.58 m2    Laboratory Data    Latest Ref Rng & Units 12/09/2022   12:42 PM 11/11/2022    2:10 PM 10/21/2022   10:51 AM  CBC EXTENDED  WBC 4.0 - 10.5 K/uL 5.4  4.8  3.5   RBC 3.87 - 5.11 MIL/uL 4.61  4.71  4.46   Hemoglobin 12.0 - 15.0 g/dL 57.8  46.9  62.9   HCT 36.0 - 46.0 % 43.3  43.9  41.1   Platelets 150 - 400 K/uL 256  256  181   NEUT# 1.7 - 7.7 K/uL 2.8  2.7  1.9   Lymph# 0.7 - 4.0 K/uL 1.7  1.5  1.2  Latest Ref Rng & Units 12/09/2022   12:42 PM 11/11/2022    2:10 PM 10/21/2022   10:51 AM  CMP  Glucose 70 - 99 mg/dL 98  528  413   BUN 8 - 23 mg/dL 9  10  12    Creatinine 0.44 - 1.00 mg/dL 2.44  0.10  2.72   Sodium 135 - 145 mmol/L 141  141  140   Potassium 3.5 - 5.1 mmol/L 4.0  3.8  3.9   Chloride 98 - 111 mmol/L 106  105  107   CO2 22 - 32 mmol/L 26  27  27    Calcium 8.9 - 10.3 mg/dL 9.7  9.5  9.4   Total Protein 6.5 - 8.1 g/dL 8.1  8.0  7.4   Total Bilirubin 0.3 - 1.2 mg/dL 0.9  1.0  0.7   Alkaline Phos 38 - 126 U/L 112  112  114   AST 15 - 41 U/L 24  26  20    ALT 0 - 44 U/L 17  15  13      Lab Results  Component Value Date   MG 2.0 07/31/2022   MG 2.3 12/01/2007   MG 2.7 (H) 11/30/2007   Lab Results  Component Value Date   CA2729 31.1 06/06/2022     Adverse Effects Assessment Constipation: as above, may try Senna-S. Likely not from abemaciclib Sleep: as above, sleeping long hours. This could be from the abemaciclib but she also  likes to sleep and feels rested afterwards.   Access Assessment [name] is currently receiving her abemaciclib through [employer] concerns:  none  Medication Reconciliation The patient's medication list was reviewed today with the patient? Yes New medications or herbal supplements have recently been started? No  Any medications have been discontinued? No  The medication list was updated and reconciled based on the patient's most recent medication list in the electronic medical record (EMR) including herbal products and OTC medications.   Medications Current Outpatient Medications  Medication Sig Dispense Refill   abemaciclib (VERZENIO) 50 MG tablet Take 1 tablet (50 mg total) by mouth 2 (two) times daily. 56 tablet 5   aspirin EC 81 MG tablet Take 1 tablet (81 mg total) by mouth daily. Swallow whole. 90 tablet 2   letrozole (FEMARA) 2.5 MG tablet Take 1 tablet (2.5 mg total) by mouth daily. 90 tablet 3   Multiple Vitamin (MULTIVITAMIN PO) Take 1 tablet by mouth daily.     pravastatin (PRAVACHOL) 80 MG tablet Take 1 tablet (80 mg total) by mouth daily at 6 PM. 90 tablet 3   UNABLE TO FIND Med Name: Renewlife Women's Care Probiotic     No current facility-administered medications for this visit.    Drug-Drug Interactions (DDIs) DDIs were evaluated? Yes Significant DDIs? No  The patient was instructed to speak with their health care provider and/or the oral chemotherapy pharmacist before starting any new drug, including prescription or over the counter, natural / herbal products, or vitamins.  Supportive Care Diarrhea: we reviewed that diarrhea is common with abemaciclib and confirmed that she does have loperamide (Imodium) at home.  We reviewed how to take this medication PRN. Neutropenia: we discussed the importance of having a thermometer and what the Centers for Disease Control and Prevention (CDC) considers a fever which is 100.39F (38C) or higher.   Gave patient 24/7 triage line to call if any fevers or symptoms. ILD/Pneumonitis: we reviewed potential symptoms including cough, shortness, and  fatigue.  VTE: reviewed signs of DVT such as leg swelling, redness, pain, or tenderness and signs of PE such as shortness of breath, rapid or irregular heartbeat, cough, chest pain, or lightheadedness. Reviewed to take the medication every 12 hours (with food sometimes can be easier on the stomach) and to take it at the same time every day. Hepatotoxicity:WNL Drug interactions with grapefruit products  Dosing Assessment Hepatic adjustments needed? No  Renal adjustments needed? No  Toxicity adjustments needed? No  The current dosing regimen is appropriate to continue at this time.  Follow-Up Plan Continue abemaciclib 50 mg by mouth daily Continue letrozole 2.5 mg by mouth daily Restaging scans ordered. Last done on 08/31/22. Telephone visit with Dr. Pamelia Hoit to follow Labs, pharmacy clinic visit in 4 months Labs, Dr. Pamelia Hoit visit, in 2 months. Monitor constipation -- gave other options besides bisacodyl such as Senna-S Monitor weight. Encouraged to use shakes and smoothies to get daily calorie and nutrition needs  [name] participated in the discussion, expressed understanding, and voiced agreement with the above plan. All questions were answered to her satisfaction. The patient was advised to contact the clinic at [phone] with any questions or concerns prior to her return visit.   I spent 30 minutes assessing and educating the patient.  Jeannett Dekoning A. Odetta Pink, PharmD, BCOP, CPP  Anselm Lis, RPH-CPP, 12/09/2022  1:36 PM   **Disclaimer: This note was dictated with voice recognition software. Similar sounding words can inadvertently be transcribed and this note may contain transcription errors which may not have been corrected upon publication of note.**

## 2022-12-22 ENCOUNTER — Telehealth: Payer: Self-pay | Admitting: Hematology and Oncology

## 2022-12-23 ENCOUNTER — Other Ambulatory Visit: Payer: Self-pay | Admitting: Hematology and Oncology

## 2022-12-23 ENCOUNTER — Inpatient Hospital Stay: Payer: Medicare Other | Admitting: Hematology and Oncology

## 2022-12-28 ENCOUNTER — Ambulatory Visit (HOSPITAL_COMMUNITY)
Admission: RE | Admit: 2022-12-28 | Discharge: 2022-12-28 | Disposition: A | Discharge status: Home / Self Care | Payer: Medicare Other | Source: Ambulatory Visit | Attending: Hematology and Oncology | Admitting: Hematology and Oncology

## 2022-12-28 DIAGNOSIS — C7931 Secondary malignant neoplasm of brain: Secondary | ICD-10-CM | POA: Diagnosis present

## 2022-12-28 DIAGNOSIS — C50911 Malignant neoplasm of unspecified site of right female breast: Secondary | ICD-10-CM | POA: Diagnosis present

## 2022-12-28 MED ORDER — IOHEXOL 300 MG/ML  SOLN
100.0000 mL | Freq: Once | INTRAMUSCULAR | Status: AC | PRN
  Administered 2022-12-28: 100 mL via INTRAVENOUS

## 2022-12-28 NOTE — Progress Notes (Signed)
HEMATOLOGY-ONCOLOGY TELEPHONE VISIT PROGRESS NOTE  I connected with our patient on 01/04/23 at  9:15 AM EDT by telephone and verified that I am speaking with the correct person using two identifiers.  I discussed the limitations, risks, security and privacy concerns of performing an evaluation and management service by telephone and the availability of in person appointments.  I also discussed with the patient that there may be a patient responsible charge related to this service. The patient expressed understanding and agreed to proceed.   History of Present Illness: [name] is a 89 y.o. female is here because of recent diagnosis of pleural effusion with lymphadenopathy and a breast mass. Currently on letrozole. She presents to the clinic for a telephone follow-up.  Appetite has increased. C/O cough  REVIEW OF SYSTEMS:   Constitutional: Denies fevers, chills or abnormal weight loss All other systems were reviewed with the patient and are negative. Observations/Objective:     Assessment Plan:  Cancer of right breast metastatic to brain [facility]) CT CAP 06/03/2022: Large right pleural effusion, marked soft tissue thickening around the right hilum and right infrahilar region, mediastinal hilar and upper abdominal lymph nodes, right breast mass 5 cm   06/06/2022: Brain MRI: 2 small lesions left frontal lobe 3 mm and 4 mm concerning for metastatic disease, additional dural based contrast-enhancing lesion left frontal convexity possibly small meningioma, acute/subacute infarcts involving the left basal ganglia and right centrum semiovale   Hospitalization 06/03/2022-06/07/2022: Fatigue, shortness of breath Thoracentesis 06/04/2022: Negative for malignant cells U/S Right breast: 06/24/22: Rt breast mass: 6.3 cm, 3 LN axilla Rt Breast Biopsy: 06/29/22: Grade 2 IDC with extracellular mucin, lymph node positive for IDC ER 100%, PR 100%, Ki67 25%, HER2 0 negative Hospitalization 07/30/2022-07/31/2022 fall,  urine incontinence CT CAP 09/01/2022: Developing multiple hepatic metastases, multiple areas of abnormal lymph node enlargement mediastinum and hilar appears similar, left adrenal metastases, persistent bilateral pleural effusions    Treatment plan:  Palliative treatment with antiestrogen therapy with letrozole 2.5 mg daily: Added Verzinio 50 p.o. twice daily starting 10/21/2022   Verzinio toxicities: Fatigue and decreased appetite: Much improved after we cut down the dosage to 50 mg once at bedtime Mild leukopenia: Improved after reducing the dosage to once a day. Constipation: On laxatives   CT CAP 12/30/2022: Moderate right pleural effusion, unchanged right axillary, mediastinal abdominal lymph nodes, unchanged liver and adrenal and bone metastases.  Masslike thickening endometrium unchanged.  Hydronephrosis left inferior pole unchanged  Radiology review: Based on the scans overall she has stable disease.  Therefore we will continue with the same dosage of Verzinio 50 mg once a day.  Lab and follow-up in 1 month   I discussed the assessment and treatment plan with the patient. The patient was provided an opportunity to ask questions and all were answered. The patient agreed with the plan and demonstrated an understanding of the instructions. The patient was advised to call back or seek an in-person evaluation if the symptoms worsen or if the condition fails to improve as anticipated.   I provided 12 minutes of non-face-to-face time during this encounter.  This includes time for charting and coordination of care   Tamsen Meek, MD  I Janan Ridge am acting as a scribe for Dr.Vinay Gudena  I have reviewed the above documentation for accuracy and completeness, and I agree with the above.

## 2023-01-04 ENCOUNTER — Inpatient Hospital Stay: Payer: Medicare Other | Attending: Hematology and Oncology | Admitting: Hematology and Oncology

## 2023-01-04 DIAGNOSIS — C7931 Secondary malignant neoplasm of brain: Secondary | ICD-10-CM | POA: Diagnosis not present

## 2023-01-04 DIAGNOSIS — C50911 Malignant neoplasm of unspecified site of right female breast: Secondary | ICD-10-CM | POA: Diagnosis not present

## 2023-01-04 NOTE — Assessment & Plan Note (Addendum)
CT CAP 06/03/2022: Large right pleural effusion, marked soft tissue thickening around the right hilum and right infrahilar region, mediastinal hilar and upper abdominal lymph nodes, right breast mass 5 cm   06/06/2022: Brain MRI: 2 small lesions left frontal lobe 3 mm and 4 mm concerning for metastatic disease, additional dural based contrast-enhancing lesion left frontal convexity possibly small meningioma, acute/subacute infarcts involving the left basal ganglia and right centrum semiovale   Hospitalization 06/03/2022-06/07/2022: Fatigue, shortness of breath Thoracentesis 06/04/2022: Negative for malignant cells U/S Right breast: 06/24/22: Rt breast mass: 6.3 cm, 3 LN axilla Rt Breast Biopsy: 06/29/22: Grade 2 IDC with extracellular mucin, lymph node positive for IDC ER 100%, PR 100%, Ki67 25%, HER2 0 negative Hospitalization 07/30/2022-07/31/2022 fall, urine incontinence CT CAP 09/01/2022: Developing multiple hepatic metastases, multiple areas of abnormal lymph node enlargement mediastinum and hilar appears similar, left adrenal metastases, persistent bilateral pleural effusions    Treatment plan:  Palliative treatment with antiestrogen therapy with letrozole 2.5 mg daily: Added Verzinio 50 p.o. twice daily starting 10/21/2022   Verzinio toxicities: Fatigue and decreased appetite: Much improved after they cut down the dosage to 50 mg once at bedtime Mild leukopenia: Improved after reducing the dosage to once a day.   CT CAP 12/30/2022: Moderate right pleural effusion, unchanged right axillary, mediastinal abdominal lymph nodes, unchanged liver and adrenal and bone metastases.  Masslike thickening endometrium unchanged.  Hydronephrosis left inferior pole unchanged  Radiology review: Based on the scans overall she has stable disease.  Therefore we will continue with the same dosage of Verzinio 50 mg once a day.  Lab and follow-up in 3 months

## 2023-02-09 ENCOUNTER — Telehealth: Payer: Self-pay | Admitting: Hematology and Oncology

## 2023-02-10 ENCOUNTER — Other Ambulatory Visit: Payer: Medicare Other

## 2023-02-10 ENCOUNTER — Ambulatory Visit: Payer: Medicare Other | Admitting: Hematology and Oncology

## 2023-02-25 ENCOUNTER — Telehealth: Payer: Self-pay | Admitting: Hematology and Oncology

## 2023-02-25 NOTE — Telephone Encounter (Signed)
Rescheduled appointment per provider PAL. Left voicemail for the patients daughter Jasmine December with appointment details.

## 2023-03-01 ENCOUNTER — Inpatient Hospital Stay: Payer: Medicare Other

## 2023-03-01 ENCOUNTER — Inpatient Hospital Stay: Payer: Medicare Other | Admitting: Hematology and Oncology

## 2023-03-07 ENCOUNTER — Ambulatory Visit: Payer: Medicare Other | Admitting: Hematology and Oncology

## 2023-03-07 ENCOUNTER — Other Ambulatory Visit: Payer: Medicare Other

## 2023-03-24 ENCOUNTER — Other Ambulatory Visit: Payer: Self-pay | Admitting: Hematology and Oncology

## 2023-03-31 ENCOUNTER — Inpatient Hospital Stay: Payer: Medicare Other | Attending: Hematology and Oncology

## 2023-03-31 ENCOUNTER — Inpatient Hospital Stay: Payer: Medicare Other | Admitting: Hematology and Oncology

## 2023-03-31 ENCOUNTER — Other Ambulatory Visit: Payer: Self-pay

## 2023-03-31 VITALS — BP 125/72 | HR 85 | Temp 97.8°F | Resp 18 | Ht 65.0 in | Wt 118.9 lb

## 2023-03-31 DIAGNOSIS — C7931 Secondary malignant neoplasm of brain: Secondary | ICD-10-CM | POA: Diagnosis present

## 2023-03-31 DIAGNOSIS — C787 Secondary malignant neoplasm of liver and intrahepatic bile duct: Secondary | ICD-10-CM | POA: Diagnosis not present

## 2023-03-31 DIAGNOSIS — Z79811 Long term (current) use of aromatase inhibitors: Secondary | ICD-10-CM | POA: Diagnosis not present

## 2023-03-31 DIAGNOSIS — Z170 Estrogen receptor positive status [ER+]: Secondary | ICD-10-CM | POA: Diagnosis not present

## 2023-03-31 DIAGNOSIS — Z79899 Other long term (current) drug therapy: Secondary | ICD-10-CM | POA: Diagnosis not present

## 2023-03-31 DIAGNOSIS — C50911 Malignant neoplasm of unspecified site of right female breast: Secondary | ICD-10-CM | POA: Diagnosis not present

## 2023-03-31 DIAGNOSIS — N6310 Unspecified lump in the right breast, unspecified quadrant: Secondary | ICD-10-CM

## 2023-03-31 LAB — CMP (CANCER CENTER ONLY)
ALT: 42 U/L (ref 0–44)
AST: 40 U/L (ref 15–41)
Albumin: 4.1 g/dL (ref 3.5–5.0)
Alkaline Phosphatase: 151 U/L — ABNORMAL HIGH (ref 38–126)
Anion gap: 7 (ref 5–15)
BUN: 15 mg/dL (ref 8–23)
CO2: 28 mmol/L (ref 22–32)
Calcium: 9.7 mg/dL (ref 8.9–10.3)
Chloride: 106 mmol/L (ref 98–111)
Creatinine: 0.94 mg/dL (ref 0.44–1.00)
GFR, Estimated: 57 mL/min — ABNORMAL LOW (ref >60)
Glucose, Bld: 89 mg/dL (ref 70–99)
Potassium: 4 mmol/L (ref 3.5–5.1)
Sodium: 141 mmol/L (ref 135–145)
Total Bilirubin: 0.8 mg/dL (ref 0.3–1.2)
Total Protein: 7.6 g/dL (ref 6.5–8.1)

## 2023-03-31 LAB — CBC WITH DIFFERENTIAL (CANCER CENTER ONLY)
Abs Immature Granulocytes: 0.01 10*3/uL (ref 0.00–0.07)
Basophils Absolute: 0.1 10*3/uL (ref 0.0–0.1)
Basophils Relative: 1 %
Eosinophils Absolute: 0.1 10*3/uL (ref 0.0–0.5)
Eosinophils Relative: 3 %
HCT: 43.8 % (ref 36.0–46.0)
Hemoglobin: 14.4 g/dL (ref 12.0–15.0)
Immature Granulocytes: 0 %
Lymphocytes Relative: 35 %
Lymphs Abs: 1.9 10*3/uL (ref 0.7–4.0)
MCH: 31.2 pg (ref 26.0–34.0)
MCHC: 32.9 g/dL (ref 30.0–36.0)
MCV: 94.8 fL (ref 80.0–100.0)
Monocytes Absolute: 0.6 10*3/uL (ref 0.1–1.0)
Monocytes Relative: 11 %
Neutro Abs: 2.7 10*3/uL (ref 1.7–7.7)
Neutrophils Relative %: 50 %
Platelet Count: 207 10*3/uL (ref 150–400)
RBC: 4.62 MIL/uL (ref 3.87–5.11)
RDW: 14.4 % (ref 11.5–15.5)
WBC Count: 5.3 10*3/uL (ref 4.0–10.5)
nRBC: 0 % (ref 0.0–0.2)

## 2023-03-31 NOTE — Progress Notes (Addendum)
Patient Care Team: [name], NP as PCP - General (Adult Health Nurse Practitioner) Serena Croissant, MD as Consulting Physician (Hematology and Oncology) Pershing Proud, RN as Oncology Nurse Navigator Donnelly Angelica, RN as Oncology Nurse Navigator Carman Ching, OD (Optometry) Croitoru, Rachelle Hora, MD as Consulting Physician (Cardiology) Serena Croissant, MD as Consulting Physician (Hematology and Oncology) Nita Sells, MD (Dermatology)  DIAGNOSIS:  Encounter Diagnosis  Name Primary?   Cancer of right breast metastatic to brain (HCC) Yes      CHIEF COMPLIANT: Follow-up of metastatic breast cancer on Verzenio with letrozole    History of Present Illness   The patient, with a history of cancer, presents for a follow-up visit after six months on Verzenio. She reports tolerating the medication well, with no significant side effects such as diarrhea. Her energy levels are good, and she is able to maintain physical activity, including biking for about five minutes daily. She has not experienced any significant illness since the last visit.  ALLERGIES:  is allergic to lipitor [atorvastatin] and simvastatin.  MEDICATIONS:  Current Outpatient Medications  Medication Sig Dispense Refill   letrozole (FEMARA) 2.5 MG tablet Take 1 tablet (2.5 mg total) by mouth daily. 90 tablet 3   Multiple Vitamin (MULTIVITAMIN PO) Take 1 tablet by mouth daily.     pravastatin (PRAVACHOL) 80 MG tablet Take 1 tablet (80 mg total) by mouth daily at 6 PM. 90 tablet 3   Pseudoephedrine-Acetaminophen (SM NON-ASPRIN SINUS PO) Take by mouth.     UNABLE TO FIND Med Name: Renewlife Women's Care Probiotic     VERZENIO 50 MG tablet TAKE 1 TABLET BY MOUTH TWICE DAILY 56 tablet 5   No current facility-administered medications for this visit.    PHYSICAL EXAMINATION: ECOG PERFORMANCE STATUS: 1 - Symptomatic but completely ambulatory  Vitals:   03/31/23 1414  BP: 125/72  Pulse: 85  Resp: 18  Temp: 97.8 F (36.6  C)  SpO2: 96%   Filed Weights   03/31/23 1414  Weight: 118 lb 14.4 oz (53.9 kg)      LABORATORY DATA:  I have reviewed the data as listed    Latest Ref Rng & Units 03/31/2023    1:29 PM 12/09/2022   12:42 PM 11/11/2022    2:10 PM  CMP  Glucose 70 - 99 mg/dL 89  98  409   BUN 8 - 23 mg/dL 15  9  10    Creatinine 0.44 - 1.00 mg/dL 8.11  9.14  7.82   Sodium 135 - 145 mmol/L 141  141  141   Potassium 3.5 - 5.1 mmol/L 4.0  4.0  3.8   Chloride 98 - 111 mmol/L 106  106  105   CO2 22 - 32 mmol/L 28  26  27    Calcium 8.9 - 10.3 mg/dL 9.7  9.7  9.5   Total Protein 6.5 - 8.1 g/dL 7.6  8.1  8.0   Total Bilirubin 0.3 - 1.2 mg/dL 0.8  0.9  1.0   Alkaline Phos 38 - 126 U/L 151  112  112   AST 15 - 41 U/L 40  24  26   ALT 0 - 44 U/L 42  17  15     Lab Results  Component Value Date   WBC 5.3 03/31/2023   HGB 14.4 03/31/2023   HCT 43.8 03/31/2023   MCV 94.8 03/31/2023   PLT 207 03/31/2023   NEUTROABS 2.7 03/31/2023    ASSESSMENT & PLAN:  Cancer of right breast metastatic to brain [facility]) CT CAP 06/03/2022: Large right pleural effusion, marked soft tissue thickening around the right hilum and right infrahilar region, mediastinal hilar and upper abdominal lymph nodes, right breast mass 5 cm   06/06/2022: Brain MRI: 2 small lesions left frontal lobe 3 mm and 4 mm concerning for metastatic disease, additional dural based contrast-enhancing lesion left frontal convexity possibly small meningioma, acute/subacute infarcts involving the left basal ganglia and right centrum semiovale   Hospitalization 06/03/2022-06/07/2022: Fatigue, shortness of breath Thoracentesis 06/04/2022: Negative for malignant cells U/S Right breast: 06/24/22: Rt breast mass: 6.3 cm, 3 LN axilla Rt Breast Biopsy: 06/29/22: Grade 2 IDC with extracellular mucin, lymph node positive for IDC ER 100%, PR 100%, Ki67 25%, HER2 0 negative Hospitalization 07/30/2022-07/31/2022 fall, urine incontinence CT CAP 09/01/2022: Developing multiple  hepatic metastases, multiple areas of abnormal lymph node enlargement mediastinum and hilar appears similar, left adrenal metastases, persistent bilateral pleural effusions    Treatment plan:  Palliative treatment with antiestrogen therapy with letrozole 2.5 mg daily:  Verzinio 50 p.o. once daily starting 10/21/2022 (initially twice daily but reduce to once daily because of fatigue)   Verzinio toxicities: Fatigue   Mild leukopenia: Improved after reducing the dosage to once a day.   CT CAP 12/30/2022: Moderate right pleural effusion, unchanged right axillary, mediastinal abdominal lymph nodes, unchanged liver and adrenal and bone metastases.  Masslike thickening endometrium unchanged.  Hydronephrosis left inferior pole unchanged   CT CAP in 3 months and I will follow-up after that to discuss results.    Orders Placed This Encounter  Procedures   CT CHEST ABDOMEN PELVIS W CONTRAST    Standing Status:   Future    Standing Expiration Date:   03/30/2024    Order Specific Question:   If indicated for the ordered procedure, I authorize the administration of contrast media per Radiology protocol    Answer:   Yes    Order Specific Question:   Does the patient have a contrast media/X-ray dye allergy?    Answer:   No    Order Specific Question:   Preferred imaging location?    Answer:   [facility]    Order Specific Question:   Release to patient    Answer:   Immediate    Order Specific Question:   If indicated for the ordered procedure, I authorize the administration of oral contrast media per Radiology protocol    Answer:   Yes   The patient has a good understanding of the overall plan. she agrees with it. she will call with any problems that may develop before the next visit here. Total time spent: 30 mins including face to face time and time spent for planning, charting and co-ordination of care   Tamsen Meek, MD 03/31/23

## 2023-03-31 NOTE — Assessment & Plan Note (Addendum)
CT CAP 06/03/2022: Large right pleural effusion, marked soft tissue thickening around the right hilum and right infrahilar region, mediastinal hilar and upper abdominal lymph nodes, right breast mass 5 cm   06/06/2022: Brain MRI: 2 small lesions left frontal lobe 3 mm and 4 mm concerning for metastatic disease, additional dural based contrast-enhancing lesion left frontal convexity possibly small meningioma, acute/subacute infarcts involving the left basal ganglia and right centrum semiovale   Hospitalization 06/03/2022-06/07/2022: Fatigue, shortness of breath Thoracentesis 06/04/2022: Negative for malignant cells U/S Right breast: 06/24/22: Rt breast mass: 6.3 cm, 3 LN axilla Rt Breast Biopsy: 06/29/22: Grade 2 IDC with extracellular mucin, lymph node positive for IDC ER 100%, PR 100%, Ki67 25%, HER2 0 negative Hospitalization 07/30/2022-07/31/2022 fall, urine incontinence CT CAP 09/01/2022: Developing multiple hepatic metastases, multiple areas of abnormal lymph node enlargement mediastinum and hilar appears similar, left adrenal metastases, persistent bilateral pleural effusions    Treatment plan:  Palliative treatment with antiestrogen therapy with letrozole 2.5 mg daily:  Verzinio 50 p.o. once daily starting 10/21/2022 (initially twice daily but reduce to once daily because of fatigue)   Verzinio toxicities: Fatigue   Mild leukopenia: Improved after reducing the dosage to once a day.   CT CAP 12/30/2022: Moderate right pleural effusion, unchanged right axillary, mediastinal abdominal lymph nodes, unchanged liver and adrenal and bone metastases.  Masslike thickening endometrium unchanged.  Hydronephrosis left inferior pole unchanged   CT CAP in 3 months and I will follow-up after that to discuss results.

## 2023-04-05 ENCOUNTER — Telehealth: Payer: Self-pay | Admitting: Hematology and Oncology

## 2023-04-14 ENCOUNTER — Ambulatory Visit: Payer: Medicare Other | Admitting: Pharmacist

## 2023-04-14 ENCOUNTER — Other Ambulatory Visit: Payer: Medicare Other

## 2023-04-28 ENCOUNTER — Telehealth: Payer: Self-pay | Admitting: Pharmacy Technician

## 2023-04-28 NOTE — Telephone Encounter (Signed)
Oral Oncology Patient Advocate Encounter   Received notification that patient is due for re-enrollment for assistance for Verzenio through [employer].   Re-enrollment process has been initiated and will be submitted upon completion of necessary documents.  Patient's daughter agreed to have patient sign 05/04/23 at the office.  [employer] number [phone].   I will continue to follow until final determination.  Jinger Neighbors, CPhT-Adv Oncology Pharmacy Patient Advocate [facility] Direct Number: [phone]  Fax: [phone]

## 2023-05-03 ENCOUNTER — Other Ambulatory Visit: Payer: Self-pay | Admitting: *Deleted

## 2023-05-03 DIAGNOSIS — C50911 Malignant neoplasm of unspecified site of right female breast: Secondary | ICD-10-CM

## 2023-05-04 ENCOUNTER — Inpatient Hospital Stay: Payer: Medicare Other | Attending: Hematology and Oncology

## 2023-05-04 ENCOUNTER — Inpatient Hospital Stay: Payer: Medicare Other | Admitting: Pharmacist

## 2023-05-04 VITALS — BP 140/79 | HR 92 | Temp 97.5°F | Resp 18 | Ht 65.0 in | Wt 119.3 lb

## 2023-05-04 DIAGNOSIS — C50911 Malignant neoplasm of unspecified site of right female breast: Secondary | ICD-10-CM | POA: Insufficient documentation

## 2023-05-04 DIAGNOSIS — C7931 Secondary malignant neoplasm of brain: Secondary | ICD-10-CM | POA: Diagnosis present

## 2023-05-04 DIAGNOSIS — N6310 Unspecified lump in the right breast, unspecified quadrant: Secondary | ICD-10-CM

## 2023-05-04 LAB — CBC WITH DIFFERENTIAL (CANCER CENTER ONLY)
Abs Immature Granulocytes: 0.02 10*3/uL (ref 0.00–0.07)
Basophils Absolute: 0.1 10*3/uL (ref 0.0–0.1)
Basophils Relative: 1 %
Eosinophils Absolute: 0.2 10*3/uL (ref 0.0–0.5)
Eosinophils Relative: 3 %
HCT: 42.2 % (ref 36.0–46.0)
Hemoglobin: 14.4 g/dL (ref 12.0–15.0)
Immature Granulocytes: 0 %
Lymphocytes Relative: 32 %
Lymphs Abs: 1.6 10*3/uL (ref 0.7–4.0)
MCH: 31.9 pg (ref 26.0–34.0)
MCHC: 34.1 g/dL (ref 30.0–36.0)
MCV: 93.4 fL (ref 80.0–100.0)
Monocytes Absolute: 0.7 10*3/uL (ref 0.1–1.0)
Monocytes Relative: 13 %
Neutro Abs: 2.5 10*3/uL (ref 1.7–7.7)
Neutrophils Relative %: 51 %
Platelet Count: 196 10*3/uL (ref 150–400)
RBC: 4.52 MIL/uL (ref 3.87–5.11)
RDW: 14.3 % (ref 11.5–15.5)
WBC Count: 5 10*3/uL (ref 4.0–10.5)
nRBC: 0 % (ref 0.0–0.2)

## 2023-05-04 LAB — CMP (CANCER CENTER ONLY)
ALT: 85 U/L — ABNORMAL HIGH (ref 0–44)
AST: 69 U/L — ABNORMAL HIGH (ref 15–41)
Albumin: 4.1 g/dL (ref 3.5–5.0)
Alkaline Phosphatase: 173 U/L — ABNORMAL HIGH (ref 38–126)
Anion gap: 6 (ref 5–15)
BUN: 13 mg/dL (ref 8–23)
CO2: 29 mmol/L (ref 22–32)
Calcium: 9.7 mg/dL (ref 8.9–10.3)
Chloride: 106 mmol/L (ref 98–111)
Creatinine: 0.97 mg/dL (ref 0.44–1.00)
GFR, Estimated: 54 mL/min — ABNORMAL LOW (ref >60)
Glucose, Bld: 94 mg/dL (ref 70–99)
Potassium: 4.1 mmol/L (ref 3.5–5.1)
Sodium: 141 mmol/L (ref 135–145)
Total Bilirubin: 0.8 mg/dL (ref <1.2)
Total Protein: 7.7 g/dL (ref 6.5–8.1)

## 2023-05-04 NOTE — Telephone Encounter (Signed)
Oral Oncology Patient Advocate Encounter   Submitted application for assistance for Verzenio to [employer].   Application submitted via e-fax to [phone]   [facility] phone number [phone].  I have added an additional note in the renewal that patient does not need a refill at present due to recent dose change.   I will continue to check the status until final determination.   Jinger Neighbors, CPhT-Adv Oncology Pharmacy Patient Advocate [facility] Direct Number: [phone]  Fax: [phone]

## 2023-05-04 NOTE — Progress Notes (Signed)
[facility]       Telephone: [phone]?Fax: [phone]   Oncology Clinical Pharmacist Practitioner Progress Note  [name] was contacted via in-person to discuss her chemotherapy regimen for abemaciclib which they receive under the care of Dr. Serena Croissant. She is accompanied by her daughters today.   Current treatment regimen and start date Abemaciclib (09/22/22) 50 mg daily (10/21/22) 50 mg BID (09/22/22) Letrozole (07/05/22)   Interval History She continues on abemaciclib 50 mg by mouth daily on days 1 to 28 of a 28-day cycle. This is being given in combination with letrozole. Therapy is planned to continue until disease progression or unacceptable toxicity.  The abemaciclib was reduced to once daily on 10/21/22 by Dr. Pamelia Hoit after patient was not feeling well on the BID dosing. [name] last visited with clinical pharmacy on 12/09/22 and Dr. Pamelia Hoit on 03/31/23.   Response to Therapy [name] is doing well. She is tolerating the abemaciclib and no reported issues. Her LFTs are slightly elevated today and we will need to closely monitor. They are Grade 1 toxicities currently and gave her warning signs to watch out for. She is asymptomatic at this time. Her constipation is improved since starting Senna-S as needed. BP is slightly elevated in clinic today. Home readings 130's / 80's per patient report.  She will have restaging scans in January and then see Dr. Pamelia Hoit. We will see her again in 4 months. She prefers being seen every other month by Dr. Pamelia Hoit and clinical pharmacy. Labs, vitals, treatment parameters, and manufacturer guidelines assessing toxicity were reviewed with [name] today. Based on these values, patient is in agreement to continue abemaciclib therapy at this time.  Allergies Allergies  Allergen Reactions   Lipitor [Atorvastatin]    Simvastatin    Vitals    05/04/2023    3:09 PM 03/31/2023    2:14 PM 12/09/2022    1:08 PM  Oncology Vitals   Height 165 cm 165 cm 165 cm  Weight 54.114 kg 53.933 kg 53.071 kg  Weight (lbs) 119 lbs 5 oz 118 lbs 14 oz 117 lbs  BMI 19.85 kg/m2 19.79 kg/m2 19.47 kg/m2  Temp 97.5 F (36.4 C) 97.8 F (36.6 C) 97.5 F (36.4 C)  Pulse Rate 92 85 100  BP 140/79 125/72 136/79  Resp 18 18 18   SpO2 94 % 96 % 94 %  BSA (m2) 1.58 m2 1.57 m2 1.56 m2    Laboratory Data    Latest Ref Rng & Units 05/04/2023    2:34 PM 03/31/2023    1:29 PM 12/09/2022   12:42 PM  CBC EXTENDED  WBC 4.0 - 10.5 K/uL 5.0  5.3  5.4   RBC 3.87 - 5.11 MIL/uL 4.52  4.62  4.61   Hemoglobin 12.0 - 15.0 g/dL 29.5  62.1  30.8   HCT 36.0 - 46.0 % 42.2  43.8  43.3   Platelets 150 - 400 K/uL 196  207  256   NEUT# 1.7 - 7.7 K/uL 2.5  2.7  2.8   Lymph# 0.7 - 4.0 K/uL 1.6  1.9  1.7        Latest Ref Rng & Units 05/04/2023    2:34 PM 03/31/2023    1:29 PM 12/09/2022   12:42 PM  CMP  Glucose 70 - 99 mg/dL 94  89  98   BUN 8 - 23 mg/dL 13  15  9    Creatinine 0.44 - 1.00 mg/dL 6.57  0.94  0.72   Sodium 135 - 145 mmol/L 141  141  141   Potassium 3.5 - 5.1 mmol/L 4.1  4.0  4.0   Chloride 98 - 111 mmol/L 106  106  106   CO2 22 - 32 mmol/L 29  28  26    Calcium 8.9 - 10.3 mg/dL 9.7  9.7  9.7   Total Protein 6.5 - 8.1 g/dL 7.7  7.6  8.1   Total Bilirubin <1.2 mg/dL 0.8  0.8  0.9   Alkaline Phos 38 - 126 U/L 173  151  112   AST 15 - 41 U/L 69  40  24   ALT 0 - 44 U/L 85  42  17     Adverse Effects Assessment Alk Phos: 1.37 x ULN = Grade 1 AST: 1.68 x ULN = Grade 1 ALT: 1.93 x ULN = Grade 1  Adherence Assessment [name] reports missing 2 doses over the past 4 weeks.   Reason for missed dose: forgot Patient was re-educated on importance of adherence.   Access Assessment [name] is currently receiving her abemaciclib through [employer] concerns:  none  Medication Reconciliation The patient's medication list was reviewed today with the patient? Yes New medications or herbal supplements have  recently been started? No  Any medications have been discontinued? No  The medication list was updated and reconciled based on the patient's most recent medication list in the electronic medical record (EMR) including herbal products and OTC medications.   Medications Current Outpatient Medications  Medication Sig Dispense Refill   letrozole (FEMARA) 2.5 MG tablet Take 1 tablet (2.5 mg total) by mouth daily. 90 tablet 3   Multiple Vitamin (MULTIVITAMIN PO) Take 1 tablet by mouth daily.     pravastatin (PRAVACHOL) 80 MG tablet Take 1 tablet (80 mg total) by mouth daily at 6 PM. 90 tablet 3   Pseudoephedrine-Acetaminophen (SM NON-ASPRIN SINUS PO) Take by mouth.     UNABLE TO FIND Med Name: Renewlife Women's Care Probiotic     VERZENIO 50 MG tablet TAKE 1 TABLET BY MOUTH TWICE DAILY 56 tablet 5   No current facility-administered medications for this visit.   Drug-Drug Interactions (DDIs) DDIs were evaluated? Yes Significant DDIs? No  The patient was instructed to speak with their health care provider and/or the oral chemotherapy pharmacist before starting any new drug, including prescription or over the counter, natural / herbal products, or vitamins.  Supportive Care Diarrhea: we reviewed that diarrhea is common with abemaciclib and confirmed that she does have loperamide (Imodium) at home.  We reviewed how to take this medication PRN. Neutropenia: we discussed the importance of having a thermometer and what the Centers for Disease Control and Prevention (CDC) considers a fever which is 100.39F (38C) or higher.  Gave patient 24/7 triage line to call if any fevers or symptoms. ILD/Pneumonitis: we reviewed potential symptoms including cough, shortness, and fatigue.  VTE: reviewed signs of DVT such as leg swelling, redness, pain, or tenderness and signs of PE such as shortness of breath, rapid or irregular heartbeat, cough, chest pain, or lightheadedness. Reviewed to take the medication every  12 hours (with food sometimes can be easier on the stomach) and to take it at the same time every day. Hepatotoxicity:Alk Phos, ALT, AST all slightly elevated. All Grade 1 toxicities. No intervention at this time Drug interactions with grapefruit products  Dosing Assessment Hepatic adjustments needed? No  Renal adjustments needed? No  Toxicity adjustments needed? No  The current dosing regimen is appropriate to continue at this time.  Follow-Up Plan Continue abemaciclib 50 mg by mouth daily Continue letrozole 2.5 mg by mouth daily Restaging scans ordered by Dr. Pamelia Hoit with expected date of 07/01/23. Radiology number given to patient. Not scheduled yet. Labs, pharmacy clinic visit in 4 months Labs visit on 06/30/23, Dr. Pamelia Hoit visit on 07/07/23 Monitor LFTs. Alk Phos, ALT, AST all elevated. All grade 1 toxicities. Monitor constipation -- improved on Senna-S PRN Monitor weight. Encouraged to use shakes and smoothies to get daily calorie and nutrition needs. Stable from last visit.  [name] participated in the discussion, expressed understanding, and voiced agreement with the above plan. All questions were answered to her satisfaction. The patient was advised to contact the clinic at [phone] with any questions or concerns prior to her return visit.   I spent 30 minutes assessing and educating the patient.  Theia Dezeeuw A. Odetta Pink, PharmD, BCOP, CPP  Anselm Lis, RPH-CPP, 05/04/2023  2:42 PM   **Disclaimer: This note was dictated with voice recognition software. Similar sounding words can inadvertently be transcribed and this note may contain transcription errors which may not have been corrected upon publication of note.**

## 2023-05-09 NOTE — Telephone Encounter (Signed)
 Oral Oncology Patient Advocate Encounter   Received notification re-enrollment for assistance for Verzenio through [employer] has been approved. Patient may continue to receive their medication at $0 from this program.    [employer] phone number [phone].   Effective dates: 06/29/23 through 06/27/24  I have spoken to the patient.  Jinger Neighbors, CPhT-Adv Oncology Pharmacy Patient Advocate [facility] Direct Number: [phone]  Fax: [phone]

## 2023-05-12 ENCOUNTER — Ambulatory Visit: Payer: Medicare Other | Admitting: Orthopedic Surgery

## 2023-05-12 ENCOUNTER — Encounter: Payer: Self-pay | Admitting: Orthopedic Surgery

## 2023-05-12 VITALS — BP 116/82 | HR 78 | Temp 96.9°F | Resp 16 | Ht 65.0 in | Wt 121.6 lb

## 2023-05-12 DIAGNOSIS — Z23 Encounter for immunization: Secondary | ICD-10-CM | POA: Diagnosis not present

## 2023-05-12 DIAGNOSIS — C7931 Secondary malignant neoplasm of brain: Secondary | ICD-10-CM

## 2023-05-12 DIAGNOSIS — E782 Mixed hyperlipidemia: Secondary | ICD-10-CM | POA: Diagnosis not present

## 2023-05-12 DIAGNOSIS — R5383 Other fatigue: Secondary | ICD-10-CM

## 2023-05-12 DIAGNOSIS — C50911 Malignant neoplasm of unspecified site of right female breast: Secondary | ICD-10-CM | POA: Diagnosis not present

## 2023-05-12 NOTE — Patient Instructions (Addendum)
Recommend flu vaccine next week> please send record to our office  Consider Tdap (tetanus/ "grandmother" vaccine) sometime next year  When you feel sick or coming doen with cold> take vitamin C 1000, vitamin D 2000, and Zinc 50 mg for at least 7 days   Fasting blood work next visit> schedule early appointment

## 2023-05-12 NOTE — Progress Notes (Signed)
Careteam: Patient Care Team: [name], NP as PCP - General (Adult Health Nurse Practitioner) Serena Croissant, MD as Consulting Physician (Hematology and Oncology) Pershing Proud, RN as Oncology Nurse Navigator Donnelly Angelica, RN as Oncology Nurse Navigator Carman Ching, OD (Optometry) Croitoru, [place], MD as Consulting Physician (Cardiology) Serena Croissant, MD as Consulting Physician (Hematology and Oncology) Nita Sells, MD (Dermatology)  Seen by: Hazle Nordmann, AGNP-C  PLACE OF SERVICE:  [facility]  Advanced Directive information    Allergies  Allergen Reactions   Lipitor [Atorvastatin]    Simvastatin     Chief Complaint  Patient presents with   Medical Management of Chronic Issues    6 month follow up.    Immunizations    Discuss the need for Covid vaccine, DTAP vaccine, Pne vaccine, and Influenza vaccine. NCIR Verified.      HPI: Patient is a 89 y.o. female seen today for medical management of chronic conditions.   Daughter present during encounter.    Followed by Dr. Pamelia Hoit due to breast cancer of right breast. Remains on letrozole and verzenio. Verzenio was reduced to once a day due to fatigue. Repeat CT chest in 3 months.   Discussed vaccinations today due to advanced age and susceptible immune related to ongoing breast cancer treatment. She reports being sensitive to vaccines, but cannot report specific reaction. She normally did not get flu vaccine in past. Plan to administer Prevnar 20 today. Family will take for Tdap and flu if she does well with today's vaccine.   No recent falls.   Weights stable.   BP at goal.   Review of Systems:  Review of Systems  Constitutional:  Positive for malaise/fatigue.  HENT: Negative.    Eyes: Negative.   Respiratory: Negative.    Cardiovascular: Negative.   Gastrointestinal: Negative.   Genitourinary: Negative.   Musculoskeletal: Negative.   Skin: Negative.   Neurological: Negative.    Psychiatric/Behavioral: Negative.      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   Past Surgical History:  Procedure Laterality Date   BREAST BIOPSY Right 06/29/2022   [place] RT BREAST BX W LOC DEV 1ST LESION IMG BX SPEC US GUIDE 06/29/2022 [facility] MAMMOGRAPHY   CORONARY ANGIOPLASTY WITH STENT PLACEMENT  11/21/2007   stenting of the RCA followed by staged LAD intervention   CORONARY ARTERY BYPASS GRAFT  11/30/2007   LIMA to LAD,SVG to 1st and 2nd diagonal arteries   NM MYOVIEW LTD  07/13/2010   normal   Social History:   reports that she has never smoked. She has never used smokeless tobacco. She reports that she does not drink alcohol and does not use drugs.  Family History  Problem Relation Age of Onset   Diabetes Mother    Heart failure Mother    Stroke Mother    Heart failure Father    Diabetes Son    Diabetes Son     Medications: Patient's Medications  New Prescriptions   No medications on file  Previous Medications  LETROZOLE (FEMARA) 2.5 MG TABLET    Take 1 tablet (2.5 mg total) by mouth daily.   MULTIPLE VITAMIN (MULTIVITAMIN PO)    Take 1 tablet by mouth daily.   PRAVASTATIN (PRAVACHOL) 80 MG TABLET    Take 1 tablet (80 mg total) by mouth daily at 6 PM.   PSEUDOEPHEDRINE-ACETAMINOPHEN (SM NON-ASPRIN SINUS PO)    Take by mouth.   UNABLE TO FIND    Med Name: Renewlife Women's Care Probiotic   VERZENIO 50 MG TABLET    TAKE 1 TABLET BY MOUTH TWICE DAILY  Modified Medications   No medications on file  Discontinued Medications   No medications on file    Physical Exam:  There were no vitals filed for this visit. There is no  height or weight on file to calculate BMI. Wt Readings from Last 3 Encounters:  05/04/23 119 lb 4.8 oz (54.1 kg)  03/31/23 118 lb 14.4 oz (53.9 kg)  12/09/22 117 lb (53.1 kg)    Physical Exam Vitals reviewed.  Constitutional:      General: She is not in acute distress. HENT:     Head: Normocephalic.  Eyes:     General:        Right eye: No discharge.        Left eye: No discharge.  Cardiovascular:     Rate and Rhythm: Normal rate and regular rhythm.     Pulses: Normal pulses.     Heart sounds: Murmur heard.  Pulmonary:     Effort: Pulmonary effort is normal. No respiratory distress.     Breath sounds: Normal breath sounds. No wheezing or rales.  Abdominal:     General: Bowel sounds are normal.     Palpations: Abdomen is soft.  Musculoskeletal:     Cervical back: Neck supple.     Right lower leg: No edema.     Left lower leg: No edema.  Skin:    General: Skin is warm.     Capillary Refill: Capillary refill takes less than 2 seconds.  Neurological:     General: No focal deficit present.     Mental Status: She is alert and oriented to person, place, and time.     Motor: No weakness.     Gait: Gait normal.  Psychiatric:        Mood and Affect: Mood normal.     Labs reviewed: Basic Metabolic Panel: Recent Labs    07/31/22 0443 09/28/22 2240 12/09/22 1242 03/31/23 1329 05/04/23 1434  NA 139   < > 141 141 141  K 3.3*   < > 4.0 4.0 4.1  CL 110   < > 106 106 106  CO2 21*   < > 26 28 29   GLUCOSE 114*   < > 98 89 94  BUN 8   < > 9 15 13   CREATININE 0.72   < > 0.72 0.94 0.97  CALCIUM 8.2*   < > 9.7 9.7 9.7  MG 2.0  --   --   --   --    < > = values in this interval not displayed.   Liver Function Tests: Recent Labs    12/09/22 1242 03/31/23 1329 05/04/23 1434  AST 24 40 69*  ALT 17 42 85*  ALKPHOS 112 151* 173*  BILITOT 0.9 0.8 0.8  PROT 8.1 7.6 7.7  ALBUMIN 4.0 4.1 4.1   No results for input(s): "LIPASE", "AMYLASE" in the last 8760 hours. No results  for input(s): "  AMMONIA" in the last 8760 hours. CBC: Recent Labs    12/09/22 1242 03/31/23 1329 05/04/23 1434  WBC 5.4 5.3 5.0  NEUTROABS 2.8 2.7 2.5  HGB 14.4 14.4 14.4  HCT 43.3 43.8 42.2  MCV 93.9 94.8 93.4  PLT 256 207 196   Lipid Panel: Recent Labs    06/06/22 0438 11/04/22 1436  CHOL 279* 235*  HDL 42 55  LDLCALC 217* 151*  TRIG 101 158*  CHOLHDL 6.6 4.3   TSH: No results for input(s): "TSH" in the last 8760 hours. A1C: Lab Results  Component Value Date   HGBA1C 6.4 (H) 06/06/2022     Assessment/Plan 1. Need for pneumococcal 20-valent conjugate vaccination - Pneumococcal conjugate vaccine 20-valent (Prevnar 20)  2. Fatigue, unspecified type - associated with Verzenio> reduced to once daily by oncology - will check thyroid level due to age and symptoms - TSH  3. Cancer of right breast metastatic to brain [facility]) - ongoing - followed by Dr. Pamelia Hoit - CT chest noted liver, adrenal and bone metastases 12/30/2022 - palliative treatment  - cont letrozole  - repeat CT chest in 3 months   4. Mixed hyperlipidemia - on pravastatin - Lipid Panel; Future   Future labs/tests: lipid panel  Total time: 32 minutes. Greater than 50% of total time spent doing patient education regarding health maintenance, vaccinations, and breast cancer treatment including symptom/medication management.   Next appt: Visit date not found  Leshea Jaggers Scherry Ran  [facility] [phone]

## 2023-05-13 LAB — TSH: TSH: 5.5 m[IU]/L — ABNORMAL HIGH (ref 0.40–4.50)

## 2023-06-30 ENCOUNTER — Ambulatory Visit (HOSPITAL_COMMUNITY)
Admission: RE | Admit: 2023-06-30 | Discharge: 2023-06-30 | Disposition: A | Discharge status: Home / Self Care | Payer: Medicare Other | Source: Ambulatory Visit | Attending: Hematology and Oncology | Admitting: Hematology and Oncology

## 2023-06-30 ENCOUNTER — Inpatient Hospital Stay: Payer: Medicare Other | Attending: Hematology and Oncology

## 2023-06-30 ENCOUNTER — Other Ambulatory Visit: Payer: Self-pay | Admitting: *Deleted

## 2023-06-30 DIAGNOSIS — J90 Pleural effusion, not elsewhere classified: Secondary | ICD-10-CM | POA: Diagnosis not present

## 2023-06-30 DIAGNOSIS — C7931 Secondary malignant neoplasm of brain: Secondary | ICD-10-CM | POA: Insufficient documentation

## 2023-06-30 DIAGNOSIS — C50911 Malignant neoplasm of unspecified site of right female breast: Secondary | ICD-10-CM | POA: Insufficient documentation

## 2023-06-30 DIAGNOSIS — Z79811 Long term (current) use of aromatase inhibitors: Secondary | ICD-10-CM | POA: Diagnosis not present

## 2023-06-30 DIAGNOSIS — Z79899 Other long term (current) drug therapy: Secondary | ICD-10-CM | POA: Diagnosis not present

## 2023-06-30 DIAGNOSIS — N6310 Unspecified lump in the right breast, unspecified quadrant: Secondary | ICD-10-CM

## 2023-06-30 DIAGNOSIS — C7951 Secondary malignant neoplasm of bone: Secondary | ICD-10-CM | POA: Insufficient documentation

## 2023-06-30 LAB — CMP (CANCER CENTER ONLY)
ALT: 37 U/L (ref 0–44)
AST: 37 U/L (ref 15–41)
Albumin: 3.9 g/dL (ref 3.5–5.0)
Alkaline Phosphatase: 169 U/L — ABNORMAL HIGH (ref 38–126)
Anion gap: 7 (ref 5–15)
BUN: 12 mg/dL (ref 8–23)
CO2: 28 mmol/L (ref 22–32)
Calcium: 9.6 mg/dL (ref 8.9–10.3)
Chloride: 106 mmol/L (ref 98–111)
Creatinine: 0.85 mg/dL (ref 0.44–1.00)
GFR, Estimated: >60 mL/min (ref >60)
Glucose, Bld: 104 mg/dL — ABNORMAL HIGH (ref 70–99)
Potassium: 4 mmol/L (ref 3.5–5.1)
Sodium: 141 mmol/L (ref 135–145)
Total Bilirubin: 0.9 mg/dL (ref 0.0–1.2)
Total Protein: 7.6 g/dL (ref 6.5–8.1)

## 2023-06-30 MED ORDER — IOHEXOL 9 MG/ML PO SOLN
1000.0000 mL | Freq: Once | ORAL | Status: AC
  Administered 2023-06-30: 1000 mL via ORAL

## 2023-06-30 MED ORDER — IOHEXOL 300 MG/ML  SOLN
100.0000 mL | Freq: Once | INTRAMUSCULAR | Status: AC | PRN
  Administered 2023-06-30: 100 mL via INTRAVENOUS

## 2023-07-07 ENCOUNTER — Inpatient Hospital Stay: Payer: Medicare Other | Admitting: Hematology and Oncology

## 2023-07-07 VITALS — BP 137/69 | HR 80 | Temp 97.6°F | Resp 17 | Ht 65.0 in | Wt 124.4 lb

## 2023-07-07 DIAGNOSIS — C50911 Malignant neoplasm of unspecified site of right female breast: Secondary | ICD-10-CM | POA: Diagnosis not present

## 2023-07-07 DIAGNOSIS — C7931 Secondary malignant neoplasm of brain: Secondary | ICD-10-CM | POA: Diagnosis not present

## 2023-07-07 NOTE — Assessment & Plan Note (Addendum)
 CT CAP 06/03/2022: Large right pleural effusion, marked soft tissue thickening around the right hilum and right infrahilar region, mediastinal hilar and upper abdominal lymph nodes, right breast mass 5 cm   06/06/2022: Brain MRI: 2 small lesions left frontal lobe 3 mm and 4 mm concerning for metastatic disease, additional dural based contrast-enhancing lesion left frontal convexity possibly small meningioma, acute/subacute infarcts involving the left basal ganglia and right centrum semiovale   Hospitalization 06/03/2022-06/07/2022: Fatigue, shortness of breath Thoracentesis 06/04/2022: Negative for malignant cells U/S Right breast: 06/24/22: Rt breast mass: 6.3 cm, 3 LN axilla Rt Breast Biopsy: 06/29/22: Grade 2 IDC with extracellular mucin, lymph node positive for IDC ER 100%, PR 100%, Ki67 25%, HER2 0 negative Hospitalization 07/30/2022-07/31/2022 fall, urine incontinence CT CAP 09/01/2022: Developing multiple hepatic metastases, multiple areas of abnormal lymph node enlargement mediastinum and hilar appears similar, left adrenal metastases, persistent bilateral pleural effusions    Treatment plan:  Palliative treatment with antiestrogen therapy with letrozole  2.5 mg daily:  Verzinio 50 p.o. once daily starting 10/21/2022 (initially twice daily but reduce to once daily because of fatigue)   Verzinio toxicities: Fatigue   Mild leukopenia: Improved after reducing the dosage to once a day.   CT CAP 12/30/2022: Moderate right pleural effusion, unchanged right axillary, mediastinal abdominal lymph nodes, unchanged liver and adrenal and bone metastases.  Masslike thickening endometrium unchanged.  Hydronephrosis left inferior pole unchanged  CT CAP 07/04/2023: Unchanged small pleural effusions, unchanged right axillary mediastinal upper abdominal lymph nodes, unchanged liver adrenal and bone metastases.  Unchanged masslike thickening endometrium   Patient has a follow-up in 2 months to see John. She will be  followed every 2 months between John and myself. CT CAP in 6 months and I will follow-up after that to discuss results.

## 2023-07-07 NOTE — Progress Notes (Signed)
 Patient Care Team: Gil Greig BRAVO, NP as PCP - General (Adult Health Nurse Practitioner) Odean Potts, MD as Consulting Physician (Hematology and Oncology) Glean Stephane BROCKS, RN as Oncology Nurse Navigator Tyree Nanetta SAILOR, RN as Oncology Nurse Navigator Cleotilde Elspeth CROME, OD (Optometry) Croitoru, Jerel, MD as Consulting Physician (Cardiology) Odean Potts, MD as Consulting Physician (Hematology and Oncology) Shona Rush, MD (Dermatology)  DIAGNOSIS:  Encounter Diagnosis  Name Primary?   Cancer of right breast metastatic to brain (HCC) Yes      CHIEF COMPLIANT: Follow-up of metastatic breast cancer  HISTORY OF PRESENT ILLNESS:   History of Present Illness   The patient, with a history of metastatic cancer, presents for a follow-up visit after recent imaging. She reports that she has been feeling 'good' and has been adhering to her prescribed treatment regimen. She denies any adverse effects such as diarrhea. The patient's cancer treatment includes daily Verzenio . She has been tolerating the treatment well and is willing to continue with the current plan. The patient is also physically active, engaging in regular exercise such as cycling.         ALLERGIES:  is allergic to lipitor [atorvastatin] and simvastatin.  MEDICATIONS:  Current Outpatient Medications  Medication Sig Dispense Refill   abemaciclib  (VERZENIO ) 50 MG tablet Take 50 mg by mouth at bedtime.     letrozole  (FEMARA ) 2.5 MG tablet Take 1 tablet (2.5 mg total) by mouth daily. 90 tablet 3   Multiple Vitamin (MULTIVITAMIN PO) Take 1 tablet by mouth daily.     pravastatin  (PRAVACHOL ) 80 MG tablet Take 1 tablet (80 mg total) by mouth daily at 6 PM. 90 tablet 3   Pseudoephedrine-Acetaminophen (SM NON-ASPRIN SINUS PO) Take by mouth.     UNABLE TO FIND Med Name: Renewlife Women's Care Probiotic     No current facility-administered medications for this visit.    PHYSICAL EXAMINATION: ECOG PERFORMANCE STATUS: 1 -  Symptomatic but completely ambulatory  Vitals:   07/07/23 1412  BP: 137/69  Pulse: 80  Resp: 17  Temp: 97.6 F (36.4 C)  SpO2: 100%   Filed Weights   07/07/23 1412  Weight: 124 lb 6 oz (56.4 kg)      LABORATORY DATA:  I have reviewed the data as listed    Latest Ref Rng & Units 06/30/2023    9:17 AM 05/04/2023    2:34 PM 03/31/2023    1:29 PM  CMP  Glucose 70 - 99 mg/dL 895  94  89   BUN 8 - 23 mg/dL 12  13  15    Creatinine 0.44 - 1.00 mg/dL 9.14  9.02  9.05   Sodium 135 - 145 mmol/L 141  141  141   Potassium 3.5 - 5.1 mmol/L 4.0  4.1  4.0   Chloride 98 - 111 mmol/L 106  106  106   CO2 22 - 32 mmol/L 28  29  28    Calcium 8.9 - 10.3 mg/dL 9.6  9.7  9.7   Total Protein 6.5 - 8.1 g/dL 7.6  7.7  7.6   Total Bilirubin 0.0 - 1.2 mg/dL 0.9  0.8  0.8   Alkaline Phos 38 - 126 U/L 169  173  151   AST 15 - 41 U/L 37  69  40   ALT 0 - 44 U/L 37  85  42     Lab Results  Component Value Date   WBC 5.0 05/04/2023   HGB 14.4 05/04/2023   HCT 42.2  05/04/2023   MCV 93.4 05/04/2023   PLT 196 05/04/2023   NEUTROABS 2.5 05/04/2023    ASSESSMENT & PLAN:  Cancer of right breast metastatic to brain [facility]) CT CAP 06/03/2022: Large right pleural effusion, marked soft tissue thickening around the right hilum and right infrahilar region, mediastinal hilar and upper abdominal lymph nodes, right breast mass 5 cm   06/06/2022: Brain MRI: 2 small lesions left frontal lobe 3 mm and 4 mm concerning for metastatic disease, additional dural based contrast-enhancing lesion left frontal convexity possibly small meningioma, acute/subacute infarcts involving the left basal ganglia and right centrum semiovale   Hospitalization 06/03/2022-06/07/2022: Fatigue, shortness of breath Thoracentesis 06/04/2022: Negative for malignant cells U/S Right breast: 06/24/22: Rt breast mass: 6.3 cm, 3 LN axilla Rt Breast Biopsy: 06/29/22: Grade 2 IDC with extracellular mucin, lymph node positive for IDC ER 100%, PR 100%,  Ki67 25%, HER2 0 negative Hospitalization 07/30/2022-07/31/2022 fall, urine incontinence CT CAP 09/01/2022: Developing multiple hepatic metastases, multiple areas of abnormal lymph node enlargement mediastinum and hilar appears similar, left adrenal metastases, persistent bilateral pleural effusions    Treatment plan:  Palliative treatment with antiestrogen therapy with letrozole  2.5 mg daily:  Verzinio 50 p.o. once daily starting 10/21/2022 (initially twice daily but reduce to once daily because of fatigue)   Verzinio toxicities: Fatigue   Mild leukopenia: Improved after reducing the dosage to once a day.   CT CAP 12/30/2022: Moderate right pleural effusion, unchanged right axillary, mediastinal abdominal lymph nodes, unchanged liver and adrenal and bone metastases.  Masslike thickening endometrium unchanged.  Hydronephrosis left inferior pole unchanged  CT CAP 07/04/2023: Unchanged small pleural effusions, unchanged right axillary mediastinal upper abdominal lymph nodes, unchanged liver adrenal and bone metastases.  Unchanged masslike thickening endometrium   Patient has a follow-up in 2 months to see John. She will be followed every 2 months between John and myself. CT CAP in 6 months and I will follow-up after that to discuss results. ------------------------------------- Assessment and Plan    Metastatic Cancer Stable disease on recent imaging with no new lesions and no growth of existing lesions. Tolerating Verzenio  well with no reported side effects. -Continue Verzenio  daily. -Repeat imaging in six months.  General Health Maintenance Normal liver function tests and electrolytes. Alkaline phosphatase not concerning. -Continue current management. -See John in pharmacy in two months. -Add blood work at next appointment.          Orders Placed This Encounter  Procedures   CBC with Differential (Cancer Center Only)    Standing Status:   Future    Expiration Date:   07/06/2024   CMP  (Cancer Center only)    Standing Status:   Future    Expiration Date:   07/06/2024   The patient has a good understanding of the overall plan. she agrees with it. she will call with any problems that may develop before the next visit here. Total time spent: 30 mins including face to face time and time spent for planning, charting and co-ordination of care   Naomi MARLA Chad, MD 07/07/23

## 2023-09-01 ENCOUNTER — Inpatient Hospital Stay: Payer: Medicare Other | Attending: Hematology and Oncology

## 2023-09-01 ENCOUNTER — Inpatient Hospital Stay: Payer: Medicare Other | Admitting: Pharmacist

## 2023-09-01 VITALS — BP 143/59 | HR 82 | Temp 98.0°F | Resp 17 | Ht 65.0 in | Wt 124.3 lb

## 2023-09-01 DIAGNOSIS — C7931 Secondary malignant neoplasm of brain: Secondary | ICD-10-CM | POA: Insufficient documentation

## 2023-09-01 DIAGNOSIS — C50911 Malignant neoplasm of unspecified site of right female breast: Secondary | ICD-10-CM | POA: Insufficient documentation

## 2023-09-01 DIAGNOSIS — N6310 Unspecified lump in the right breast, unspecified quadrant: Secondary | ICD-10-CM

## 2023-09-01 LAB — CBC WITH DIFFERENTIAL (CANCER CENTER ONLY)
Abs Immature Granulocytes: 0.02 10*3/uL (ref 0.00–0.07)
Basophils Absolute: 0 10*3/uL (ref 0.0–0.1)
Basophils Relative: 1 %
Eosinophils Absolute: 0.2 10*3/uL (ref 0.0–0.5)
Eosinophils Relative: 3 %
HCT: 45.6 % (ref 36.0–46.0)
Hemoglobin: 15 g/dL (ref 12.0–15.0)
Immature Granulocytes: 0 %
Lymphocytes Relative: 33 %
Lymphs Abs: 1.9 10*3/uL (ref 0.7–4.0)
MCH: 31.5 pg (ref 26.0–34.0)
MCHC: 32.9 g/dL (ref 30.0–36.0)
MCV: 95.8 fL (ref 80.0–100.0)
Monocytes Absolute: 0.7 10*3/uL (ref 0.1–1.0)
Monocytes Relative: 12 %
Neutro Abs: 3 10*3/uL (ref 1.7–7.7)
Neutrophils Relative %: 51 %
Platelet Count: 218 10*3/uL (ref 150–400)
RBC: 4.76 MIL/uL (ref 3.87–5.11)
RDW: 13.5 % (ref 11.5–15.5)
WBC Count: 5.8 10*3/uL (ref 4.0–10.5)
nRBC: 0 % (ref 0.0–0.2)

## 2023-09-01 LAB — CMP (CANCER CENTER ONLY)
ALT: 51 U/L — ABNORMAL HIGH (ref 0–44)
AST: 33 U/L (ref 15–41)
Albumin: 4.2 g/dL (ref 3.5–5.0)
Alkaline Phosphatase: 152 U/L — ABNORMAL HIGH (ref 38–126)
Anion gap: 7 (ref 5–15)
BUN: 16 mg/dL (ref 8–23)
CO2: 29 mmol/L (ref 22–32)
Calcium: 9.6 mg/dL (ref 8.9–10.3)
Chloride: 104 mmol/L (ref 98–111)
Creatinine: 0.93 mg/dL (ref 0.44–1.00)
GFR, Estimated: 57 mL/min — ABNORMAL LOW (ref >60)
Glucose, Bld: 99 mg/dL (ref 70–99)
Potassium: 4.2 mmol/L (ref 3.5–5.1)
Sodium: 140 mmol/L (ref 135–145)
Total Bilirubin: 0.9 mg/dL (ref 0.0–1.2)
Total Protein: 7.8 g/dL (ref 6.5–8.1)

## 2023-09-01 NOTE — Progress Notes (Signed)
 [name]       Telephone: [phone]?Fax: [phone]   Oncology Clinical Pharmacist Practitioner Progress Note  [name] was contacted via in-person to discuss her chemotherapy regimen for abemaciclib which they receive under the care of [name]. She is accompanied by her daughter today.   Current treatment regimen and start date Abemaciclib (09/22/22) 50 mg daily (10/21/22) 50 mg BID (09/22/22) Letrozole (07/05/22)   Interval History She continues on abemaciclib 50 mg by mouth daily on days 1 to 28 of a 28-day cycle. This is being given in combination with letrozole. Therapy is planned to continue until disease progression or unacceptable toxicity.  The abemaciclib was reduced to once daily on 10/21/22 by [name] after patient was not feeling well on the BID dosing. [name] last visited with clinical pharmacy on 05/04/23 and [name] on 07/07/23.   Response to Therapy [name] continues to do well on abemaciclib and letrozole. No new side effects reported. We will monitor her ALT which is slightly elevated from last time. Other labs are improved or WNL. Labs, vitals, treatment parameters, and manufacturer guidelines assessing toxicity were reviewed with [name] today. Based on these values, patient is in agreement to continue abemaciclib therapy at this time.  Allergies Allergies  Allergen Reactions   Lipitor [Atorvastatin]    Simvastatin    Vitals    09/01/2023   10:16 AM 07/07/2023    2:12 PM 05/12/2023    1:01 PM  Oncology Vitals  Height 165 cm 165 cm 165 cm  Weight 56.382 kg 56.416 kg 55.157 kg  Weight (lbs) 124 lbs 5 oz 124 lbs 6 oz 121 lbs 10 oz  BMI 20.68 kg/m2 20.7 kg/m2 20.24 kg/m2  Temp 98 F (36.7 C) 97.6 F (36.4 C) 96.9 F (36.1 C)  Pulse Rate 82 80 78  BP 143/59 137/69 116/82  Resp 17 17 16   SpO2 97 % 100 % 95 %  BSA (m2) 1.61 m2 1.61 m2 1.59 m2    Laboratory Data    Latest Ref Rng & Units 09/01/2023    9:42 AM  05/04/2023    2:34 PM 03/31/2023    1:29 PM  CBC EXTENDED  WBC 4.0 - 10.5 K/uL 5.8  5.0  5.3   RBC 3.87 - 5.11 MIL/uL 4.76  4.52  4.62   Hemoglobin 12.0 - 15.0 g/dL 40.1  02.7  25.3   HCT 36.0 - 46.0 % 45.6  42.2  43.8   Platelets 150 - 400 K/uL 218  196  207   NEUT# 1.7 - 7.7 K/uL 3.0  2.5  2.7   Lymph# 0.7 - 4.0 K/uL 1.9  1.6  1.9        Latest Ref Rng & Units 09/01/2023    9:42 AM 06/30/2023    9:17 AM 05/04/2023    2:34 PM  CMP  Glucose 70 - 99 mg/dL 99  664  94   BUN 8 - 23 mg/dL 16  12  13    Creatinine 0.44 - 1.00 mg/dL 4.03  4.74  2.59   Sodium 135 - 145 mmol/L 140  141  141   Potassium 3.5 - 5.1 mmol/L 4.2  4.0  4.1   Chloride 98 - 111 mmol/L 104  106  106   CO2 22 - 32 mmol/L 29  28  29    Calcium 8.9 - 10.3 mg/dL 9.6  9.6  9.7   Total Protein 6.5 -  8.1 g/dL 7.8  7.6  7.7   Total Bilirubin 0.0 - 1.2 mg/dL 0.9  0.9  0.8   Alkaline Phos 38 - 126 U/L 152  169  173   AST 15 - 41 U/L 33  37  69   ALT 0 - 44 U/L 51  37  85     Adverse Effects Assessment ALT: slightly increased  Adherence Assessment [name] reports missing 2 doses over the past 8 weeks.   Reason for missed dose: forgot Patient was re-educated on importance of adherence.   Access Assessment [name] is currently receiving her abemaciclib through [employer] concerns:  none  Medication Reconciliation The patient's medication list was reviewed today with the patient? Yes New medications or herbal supplements have recently been started? Yes , added aspirin 81 mg, zinc, vitamin D, vitamin C, and vitamin B12 to list. Recommended by primary care [name].  They will bring doses at next visit Any medications have been discontinued? Yes Discontinued pravastatin The medication list was updated and reconciled based on the patient's most recent medication list in the electronic medical record (EMR) including herbal products and OTC medications.   Medications Current Outpatient  Medications  Medication Sig Dispense Refill   abemaciclib (VERZENIO) 50 MG tablet Take 50 mg by mouth at bedtime.     ascorbic acid (VITAMIN C) 250 MG CHEW Chew 250 mg by mouth daily.     aspirin EC 81 MG tablet Take 81 mg by mouth daily. Swallow whole.     Cholecalciferol (VITAMIN D-3 PO) Take by mouth.     Cyanocobalamin (VITAMIN B-12 PO) Take by mouth.     letrozole (FEMARA) 2.5 MG tablet Take 1 tablet (2.5 mg total) by mouth daily. 90 tablet 3   Multiple Vitamin (MULTIVITAMIN PO) Take 1 tablet by mouth daily.     Zinc 30 MG CAPS Take 30 mg by mouth daily.     No current facility-administered medications for this visit.   Drug-Drug Interactions (DDIs) DDIs were evaluated? Yes Significant DDIs? No  The patient was instructed to speak with their health care provider and/or the oral chemotherapy pharmacist before starting any new drug, including prescription or over the counter, natural / herbal products, or vitamins.  Supportive Care Diarrhea: we reviewed that diarrhea is common with abemaciclib and confirmed that she does have loperamide (Imodium) at home.  We reviewed how to take this medication PRN. Neutropenia: we discussed the importance of having a thermometer and what the Centers for Disease Control and Prevention (CDC) considers a fever which is 100.61F (38C) or higher.  Gave patient 24/7 triage line to call if any fevers or symptoms. ILD/Pneumonitis: we reviewed potential symptoms including cough, shortness, and fatigue.  VTE: reviewed signs of DVT such as leg swelling, redness, pain, or tenderness and signs of PE such as shortness of breath, rapid or irregular heartbeat, cough, chest pain, or lightheadedness. Reviewed to take the medication every 12 hours (with food sometimes can be easier on the stomach) and to take it at the same time every day. Hepatotoxicity:ALT slightly elevated. Alk Phos trending down Drug interactions with grapefruit products  Dosing Assessment Hepatic  adjustments needed? No  Renal adjustments needed? No  Toxicity adjustments needed? No  The current dosing regimen is appropriate to continue at this time.  Follow-Up Plan Continue abemaciclib 50 mg by mouth daily Continue letrozole 2.5 mg by mouth daily Restaging scans from January were stable per Dr.  Gudena. Repeat in 6 months (July 2025) She continues to follow with [name] for her other conditions. Sees them every 6 months. Last visit was 05/12/23 Will add Lab, [name] visit in 2 months [name] can follow up with clinical pharmacy as deemed necessary by [name] going forward   [name] participated in the discussion, expressed understanding, and voiced agreement with the above plan. All questions were answered to her satisfaction. The patient was advised to contact the clinic at [phone] with any questions or concerns prior to her return visit.   I spent 30 minutes assessing and educating the patient.  [name] A. Odetta Pink, PharmD, BCOP, CPP  Anselm Lis, RPH-CPP, 09/01/2023  10:37 AM   **Disclaimer: This note was dictated with voice recognition software. Similar sounding words can inadvertently be transcribed and this note may contain transcription errors which may not have been corrected upon publication of note.**

## 2023-09-20 ENCOUNTER — Other Ambulatory Visit: Payer: Self-pay | Admitting: Hematology and Oncology

## 2023-10-11 ENCOUNTER — Other Ambulatory Visit: Payer: Medicare Other

## 2023-10-27 ENCOUNTER — Encounter: Payer: Medicare Other | Admitting: Nurse Practitioner

## 2023-10-27 NOTE — Progress Notes (Signed)
 Err

## 2023-10-27 NOTE — Progress Notes (Deleted)
 Subjective:   [name] is a 89 y.o. female who presents for Medicare Annual (Subsequent) preventive examination.  Visit Complete: Virtual I connected with  [name] on 10/27/23 by a video and audio enabled telemedicine application and verified that I am speaking with the correct person using two identifiers.  Patient Location: Home  Provider Location: Home Office  I discussed the limitations of evaluation and management by telemedicine. The patient expressed understanding and agreed to proceed.  Vital Signs: Because this visit was a virtual/telehealth visit, some criteria may be missing or patient reported. Any vitals not documented were not able to be obtained and vitals that have been documented are patient reported.         Objective:    There were no vitals filed for this visit. There is no height or weight on file to calculate BMI.     05/12/2023    1:08 PM 03/31/2023    2:15 PM 11/11/2022    2:29 PM 11/04/2022    2:15 PM 10/25/2022   12:54 PM 10/04/2022    1:40 PM 09/28/2022   10:16 PM  Advanced Directives  Does Patient Have a Medical Advance Directive? Yes Yes Yes Yes Yes Yes No  Type of Estate agent of [place];Living will;Out of facility DNR (pink MOST or yellow form) Healthcare Power of [place];Living will Healthcare Power of [place];Living will Healthcare Power of [place];Living will;Out of facility DNR (pink MOST or yellow form) Healthcare Power of [place];Living will;Out of facility DNR (pink MOST or yellow form) Healthcare Power of [place];Living will;Out of facility DNR (pink MOST or yellow form)   Does patient want to make changes to medical advance directive? No - Patient declined No - Patient declined No - Patient declined No - Patient declined No - Patient declined No - Patient declined   Copy of Healthcare Power of Attorney in Chart? No - copy requested No - copy requested No - copy requested No - copy requested No - copy  requested No - copy requested   Would patient like information on creating a medical advance directive?  No - Patient declined     No - Patient declined    Current Medications (verified) Outpatient Encounter Medications as of 10/27/2023  Medication Sig   abemaciclib  (VERZENIO ) 50 MG tablet Take 50 mg by mouth at bedtime.   ascorbic acid (VITAMIN C) 250 MG CHEW Chew 250 mg by mouth daily.   aspirin  EC 81 MG tablet Take 81 mg by mouth daily. Swallow whole.   Cholecalciferol (VITAMIN D-3 PO) Take by mouth.   Cyanocobalamin (VITAMIN B-12 PO) Take by mouth.   letrozole  (FEMARA ) 2.5 MG tablet TAKE 1 TABLET BY MOUTH DAILY   Multiple Vitamin (MULTIVITAMIN PO) Take 1 tablet by mouth daily.   Zinc 30 MG CAPS Take 30 mg by mouth daily.   No facility-administered encounter medications on file as of 10/27/2023.    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   Past Surgical  History:  Procedure Laterality Date   BREAST BIOPSY Right 06/29/2022   US  RT BREAST BX W LOC DEV 1ST LESION IMG BX SPEC US  GUIDE 06/29/2022 [facility] MAMMOGRAP[facility]   CORONARY ANGIOPLASTY WITH STENT PLACEMENT  11/21/2007   stenting of the RCA followed by staged LAD intervention   CORONARY ARTERY BYPASS GRAFT  11/30/2007   LIMA to LAD,SVG to 1st and 2nd diagonal arteries   NM MYOVIEW  LTD  07/13/2010   normal   Family History  Problem Relation Age of Onset   Diabetes Mother    Heart failure Mother    Stroke Mother    Heart failure Father    Diabetes Son    Diabetes Son    Social History   Socioeconomic History    Marital status: Widowed    Spouse name: Not on file   Number of children: Not on file   Years of education: Not on file   Highest education level: Not on file  Occupational History   Not on file  Tobacco Use   Smoking status: Never   Smokeless tobacco: Never  Substance and Sexual Activity   Alcohol use: No    Alcohol/week: 0.0 standard drinks of alcohol   Drug use: No   Sexual activity: Not on file  Other Topics Concern   Not on file  Social History Narrative   Tobacco use, amount per day now:   Past tobacco use, amount per day:   How many years did you use tobacco:   Alcohol use (drinks per week):   Diet:   Do you drink/eat things with caffeine: Moderation   Marital status:    Widow                              What year were you married? 1949   Do you live in a house, apartment, assisted living, condo, trailer, etc.? Home   Is it one or more stories? [place] Style   How many persons live in your home? 2   Do you have pets in your home?( please list) Cats (3)   Highest Level of education completed? 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 Work around house                                 Type and how often? Minimal daily.   Do you have a living will? Yes   Do you have a DNR form?  Yes                                 If not, do you want to discuss one?   Do you have signed POA/HPOA forms?  Yes                      If so, please bring to you appointment      Do you have any difficulty bathing or dressing yourself? No   Do you have any difficulty preparing food or eating? No   Do you have any difficulty managing your medications? Yes   Do you have any difficulty managing your finances? Yes   Do you have any difficulty affording your medications? No    Social Drivers of Health  Financial Resource Strain: Not on file  Food Insecurity: No Food Insecurity (07/30/2022)   Hunger Vital Sign    Worried About Running Out of Food  in the Last Year: Never true    Ran Out of Food in the Last Year: Never true  Transportation Needs: No Transportation Needs (07/30/2022)   PRAPARE - Administrator, Civil Service (Medical): No    Lack of Transportation (Non-Medical): No  Physical Activity: Not on file  Stress: Not on file  Social Connections: Not on file    Tobacco Counseling Counseling given: Not Answered   Clinical Intake:                        Activities of Daily Living     No data to display          Patient Care Team: Arnetha Bhat, NP as PCP - General (Adult Health Nurse Practitioner) Cameron Cea, MD as Consulting Physician (Hematology and Oncology) Auther Bo, RN as Oncology Nurse Navigator Alane Hsu, RN as Oncology Nurse Navigator Phebe Brasil, OD (Optometry) Croitoru, Karyl Paget, MD as Consulting Physician (Cardiology) Cameron Cea, MD as Consulting Physician (Hematology and Oncology) Denman Fischer, MD (Dermatology)  Indicate any recent Medical Services you may have received from other than [facility] providers in the past year (date may be approximate).     Assessment:   This is a routine wellness examination for Allani.  Hearing/Vision screen No results found.   Goals Addressed   None    Depression Screen    05/12/2023    7:44 PM 10/25/2022   12:54 PM  PHQ 2/9 Scores  PHQ - 2 Score 0 0    Fall Risk    05/12/2023    1:08 PM 11/04/2022    2:14 PM 10/25/2022   12:54 PM 10/04/2022    1:40 PM 09/02/2022    1:54 PM  Fall Risk   Falls in the past year? 0 0 1 0 1  Number falls in past yr: 0 0 0 0 0  Injury with Fall? 0 0 0 0 1  Risk for fall due to : No Fall Risks No Fall Risks History of fall(s) No Fall Risks History of fall(s);Impaired balance/gait;Impaired mobility  Follow up Falls evaluation completed;Education provided;Falls prevention discussed Falls evaluation completed  Falls evaluation completed Falls evaluation completed;Education provided;Falls  prevention discussed    MEDICARE RISK AT HOME:    TIMED UP AND GO:  Was the test performed?  No    Cognitive Function:        10/25/2022   12:56 PM  6CIT Screen  What Year? 4 points  What month? 0 points  What time? 0 points  Count back from 20 0 points  Months in reverse 4 points  Repeat phrase 6 points  Total Score 14 points    Immunizations Immunization History  Administered Date(s) Administered   PNEUMOCOCCAL CONJUGATE-20 05/12/2023    {TDAP status:2101805}  {Flu Vaccine status:2101806}  {Pneumococcal vaccine status:2101807}  {Covid-19 vaccine status:2101808}  Qualifies for Shingles Vaccine? {YES/NO:21197}  Zostavax completed {YES/NO:21197}  {Shingrix Completed?:2101804}  Screening Tests Health Maintenance  Topic Date Due   COVID-19 Vaccine (1) Never done   DTaP/Tdap/Td (1 - Tdap) Never done   Zoster Vaccines- Shingrix (1 of 2) Never done   DEXA SCAN  Never done   Medicare Annual Wellness (AWV)  10/25/2023   INFLUENZA VACCINE  01/27/2024   Pneumonia Vaccine 46+ Years old  Completed   HPV VACCINES  Aged Out   Meningococcal B Vaccine  Aged Out    Health Maintenance  Health Maintenance Due  Topic Date Due   COVID-19 Vaccine (1) Never done   DTaP/Tdap/Td (1 - Tdap) Never done   Zoster Vaccines- Shingrix (1 of 2) Never done   DEXA SCAN  Never done   Medicare Annual Wellness (AWV)  10/25/2023    {Colorectal cancer screening:2101809}  {Mammogram status:21018020}  {Bone Density status:21018021}  Lung Cancer Screening: (Low Dose CT Chest recommended if Age 67-80 years, 20 pack-year currently smoking OR have quit w/in 15years.) {DOES NOT does:27190::"does not"} qualify.   Lung Cancer Screening Referral: ***  Additional Screening:  Hepatitis C Screening: {DOES NOT does:27190::"does not"} qualify; Completed ***  Vision Screening: Recommended annual ophthalmology exams for early detection of glaucoma and other disorders of the eye. Is the  patient up to date with their annual eye exam?  {YES/NO:21197} Who is the provider or what is the name of the office in which the patient attends annual eye exams? *** If pt is not established with a provider, would they like to be referred to a provider to establish care? {YES/NO:21197}.   Dental Screening: Recommended annual dental exams for proper oral hygiene  Diabetic Foot Exam: {Diabetic Foot Exam:2101802}  Community Resource Referral / Chronic Care Management: CRR required this visit?  {YES/NO:21197}  CCM required this visit?  {CCM Required choices:[phone]}     Plan:     I have personally reviewed and noted the following in the patient's chart:   Medical and social history Use of alcohol, tobacco or illicit drugs  Current medications and supplements including opioid prescriptions. {Opioid Prescriptions:[phone]} Functional ability and status Nutritional status Physical activity Advanced directives List of other physicians Hospitalizations, surgeries, and ER visits in previous 12 months Vitals Screenings to include cognitive, depression, and falls Referrals and appointments  In addition, I have reviewed and discussed with patient certain preventive protocols, quality metrics, and best practice recommendations. A written personalized care plan for preventive services as well as general preventive health recommendations were provided to patient.     Verma Gobble, NP   10/27/2023   After Visit Summary: {CHL AMB AWV After Visit [phone]}  Nurse Notes: ***

## 2023-10-31 ENCOUNTER — Other Ambulatory Visit: Payer: Self-pay

## 2023-10-31 ENCOUNTER — Telehealth: Payer: Self-pay | Admitting: Hematology and Oncology

## 2023-10-31 DIAGNOSIS — N6310 Unspecified lump in the right breast, unspecified quadrant: Secondary | ICD-10-CM

## 2023-11-01 ENCOUNTER — Other Ambulatory Visit

## 2023-11-01 ENCOUNTER — Ambulatory Visit: Admitting: Hematology and Oncology

## 2023-11-24 ENCOUNTER — Inpatient Hospital Stay: Admitting: Adult Health

## 2023-11-24 ENCOUNTER — Encounter: Payer: Self-pay | Admitting: Adult Health

## 2023-11-24 ENCOUNTER — Inpatient Hospital Stay: Attending: Hematology and Oncology

## 2023-11-24 VITALS — BP 109/83 | HR 83 | Temp 97.8°F | Resp 16 | Wt 126.5 lb

## 2023-11-24 DIAGNOSIS — C50911 Malignant neoplasm of unspecified site of right female breast: Secondary | ICD-10-CM | POA: Insufficient documentation

## 2023-11-24 DIAGNOSIS — C787 Secondary malignant neoplasm of liver and intrahepatic bile duct: Secondary | ICD-10-CM | POA: Insufficient documentation

## 2023-11-24 DIAGNOSIS — Z170 Estrogen receptor positive status [ER+]: Secondary | ICD-10-CM | POA: Insufficient documentation

## 2023-11-24 DIAGNOSIS — C7931 Secondary malignant neoplasm of brain: Secondary | ICD-10-CM | POA: Diagnosis not present

## 2023-11-24 DIAGNOSIS — Z79811 Long term (current) use of aromatase inhibitors: Secondary | ICD-10-CM | POA: Diagnosis not present

## 2023-11-24 DIAGNOSIS — N6310 Unspecified lump in the right breast, unspecified quadrant: Secondary | ICD-10-CM

## 2023-11-24 DIAGNOSIS — C7951 Secondary malignant neoplasm of bone: Secondary | ICD-10-CM | POA: Insufficient documentation

## 2023-11-24 LAB — CBC WITH DIFFERENTIAL (CANCER CENTER ONLY)
Abs Immature Granulocytes: 0.01 10*3/uL (ref 0.00–0.07)
Basophils Absolute: 0.1 10*3/uL (ref 0.0–0.1)
Basophils Relative: 1 %
Eosinophils Absolute: 0.1 10*3/uL (ref 0.0–0.5)
Eosinophils Relative: 3 %
HCT: 44.6 % (ref 36.0–46.0)
Hemoglobin: 14.9 g/dL (ref 12.0–15.0)
Immature Granulocytes: 0 %
Lymphocytes Relative: 29 %
Lymphs Abs: 1.4 10*3/uL (ref 0.7–4.0)
MCH: 31.6 pg (ref 26.0–34.0)
MCHC: 33.4 g/dL (ref 30.0–36.0)
MCV: 94.5 fL (ref 80.0–100.0)
Monocytes Absolute: 0.7 10*3/uL (ref 0.1–1.0)
Monocytes Relative: 14 %
Neutro Abs: 2.4 10*3/uL (ref 1.7–7.7)
Neutrophils Relative %: 53 %
Platelet Count: 170 10*3/uL (ref 150–400)
RBC: 4.72 MIL/uL (ref 3.87–5.11)
RDW: 13.6 % (ref 11.5–15.5)
WBC Count: 4.7 10*3/uL (ref 4.0–10.5)
nRBC: 0 % (ref 0.0–0.2)

## 2023-11-24 LAB — CMP (CANCER CENTER ONLY)
ALT: 25 U/L (ref 0–44)
AST: 31 U/L (ref 15–41)
Albumin: 4.2 g/dL (ref 3.5–5.0)
Alkaline Phosphatase: 133 U/L — ABNORMAL HIGH (ref 38–126)
Anion gap: 7 (ref 5–15)
BUN: 20 mg/dL (ref 8–23)
CO2: 27 mmol/L (ref 22–32)
Calcium: 9.5 mg/dL (ref 8.9–10.3)
Chloride: 106 mmol/L (ref 98–111)
Creatinine: 0.95 mg/dL (ref 0.44–1.00)
GFR, Estimated: 56 mL/min — ABNORMAL LOW (ref >60)
Glucose, Bld: 97 mg/dL (ref 70–99)
Potassium: 4.1 mmol/L (ref 3.5–5.1)
Sodium: 140 mmol/L (ref 135–145)
Total Bilirubin: 0.7 mg/dL (ref 0.0–1.2)
Total Protein: 7.8 g/dL (ref 6.5–8.1)

## 2023-11-24 NOTE — Progress Notes (Signed)
 [facility] Cancer Follow up:    Lynn Greig BRAVO, NP [address] [place] [address]   DIAGNOSIS:  Cancer Staging  No matching staging information was found for the patient.    SUMMARY OF ONCOLOGIC HISTORY: CT CAP 06/03/2022: Large right pleural effusion, marked soft tissue thickening around the right hilum and right infrahilar region, mediastinal hilar and upper abdominal lymph nodes, right breast mass 5 cm   06/06/2022: Brain MRI: 2 small lesions left frontal lobe 3 mm and 4 mm concerning for metastatic disease, additional dural based contrast-enhancing lesion left frontal convexity possibly small meningioma, acute/subacute infarcts involving the left basal ganglia and right centrum semiovale   Hospitalization 06/03/2022-06/07/2022: Fatigue, shortness of breath Thoracentesis 06/04/2022: Negative for malignant cells U/S Right breast: 06/24/22: Rt breast mass: 6.3 cm, 3 LN axilla Rt Breast Biopsy: 06/29/22: Grade 2 IDC with extracellular mucin, lymph node positive for IDC ER 100%, PR 100%, Ki67 25%, HER2 0 negative Hospitalization 07/30/2022-07/31/2022 fall, urine incontinence CT CAP 09/01/2022: Developing multiple hepatic metastases, multiple areas of abnormal lymph node enlargement mediastinum and hilar appears similar, left adrenal metastases, persistent bilateral pleural effusions   CURRENT THERAPY: letrozole  and Verzenio   INTERVAL HISTORY:  Discussed the use of AI scribe software for clinical note transcription with the patient, who gave verbal consent to proceed.  History of Present Illness [name] is a 89 year old female with metastatic breast cancer who presents for follow-up.  She is on letrozole  and Verzenio , taking both daily. The Verzenio  dose was reduced to once daily due to fatigue. A CT scan in January 2025 showed stable disease, with repeat scans scheduled for July 2025. She has lab work and follow-up appointments every two months.  She reports no new  symptoms such as diarrhea, nausea, tiredness, pain, headaches, vision changes, vomiting, or weight loss. Her weight remains stable at 126 pounds.  She maintains her strength through careful walking and physical activities, including using a recumbent bike. She takes a calcium, magnesium, and zinc supplement for bone health and denies any dental issues, confirming she has all her teeth.  She has osseous metastases and is not taking any bisphosphanate therapy.       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    SURGICAL HISTORY: Past Surgical History:  Procedure Laterality Date   BREAST BIOPSY Right 06/29/2022   US  RT BREAST BX W LOC DEV 1ST LESION IMG BX SPEC US  GUIDE 06/29/2022 [facility] MAMMOGRAPHY   CORONARY ANGIOPLASTY WITH STENT PLACEMENT  11/21/2007  stenting of the RCA followed by staged LAD intervention   CORONARY ARTERY BYPASS GRAFT  11/30/2007   LIMA to LAD,SVG to 1st and 2nd diagonal arteries   NM MYOVIEW  LTD  07/13/2010   normal    SOCIAL HISTORY: Social History   Socioeconomic History   Marital status: Widowed     Spouse name: Not on file   Number of children: Not on file   Years of education: Not on file   Highest education level: Not on file  Occupational History   Not on file  Tobacco Use   Smoking status: Never   Smokeless tobacco: Never  Substance and Sexual Activity   Alcohol use: No    Alcohol/week: 0.0 standard drinks of alcohol   Drug use: No   Sexual activity: Not on file  Other Topics Concern   Not on file  Social History Narrative   Tobacco use, amount per day now:   Past tobacco use, amount per day:   How many years did you use tobacco:   Alcohol use (drinks per week):   Diet:   Do you drink/eat things with caffeine: Moderation   Marital status:    Widow                              What year were you married? 1949   Do you live in a house, apartment, assisted living, condo, trailer, etc.? Home   Is it one or more stories? Ranch Style   How many persons live in your home? 2   Do you have pets in your home?( please list) Cats (3)   Highest Level of education completed? 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 Work around house                                 Type and how often? Minimal daily.   Do you have a living will? Yes   Do you have a DNR form?  Yes                                 If not, do you want to discuss one?   Do you have signed POA/HPOA forms?  Yes                      If so, please bring to you appointment      Do you have any difficulty bathing or dressing yourself? No   Do you have any difficulty preparing food or eating? No   Do you have any difficulty managing your medications? Yes   Do you have any difficulty managing your finances? Yes   Do you have any difficulty affording your medications? No    Social Drivers of Corporate Investment Banker Strain: Not on file  Food Insecurity: No Food Insecurity (07/30/2022)   Hunger Vital Sign    Worried About Running Out of Food in the Last Year: Never  true    Ran Out of Food in the Last Year: Never true  Transportation Needs: No Transportation Needs (07/30/2022)   PRAPARE - Administrator, Civil Service (Medical): No    Lack of Transportation (Non-Medical): No  Physical Activity: Not  on file  Stress: Not on file  Social Connections: Not on file  Intimate Partner Violence: Not At Risk (07/30/2022)   Humiliation, Afraid, Rape, and Kick questionnaire    Fear of Current or Ex-Partner: No    Emotionally Abused: No    Physically Abused: No    Sexually Abused: No    FAMILY HISTORY: Family History  Problem Relation Age of Onset   Diabetes Mother    Heart failure Mother    Stroke Mother    Heart failure Father    Diabetes Son    Diabetes Son     Review of Systems  Constitutional:  Negative for appetite change, chills, fatigue, fever and unexpected weight change.  HENT:   Negative for hearing loss, lump/mass and trouble swallowing.   Eyes:  Negative for eye problems and icterus.  Respiratory:  Negative for chest tightness, cough and shortness of breath.   Cardiovascular:  Negative for chest pain, leg swelling and palpitations.  Gastrointestinal:  Negative for abdominal distention, abdominal pain, constipation, diarrhea, nausea and vomiting.  Endocrine: Negative for hot flashes.  Genitourinary:  Negative for difficulty urinating.   Musculoskeletal:  Negative for arthralgias.  Skin:  Negative for itching and rash.  Neurological:  Negative for dizziness, extremity weakness, headaches and numbness.  Hematological:  Negative for adenopathy. Does not bruise/bleed easily.  Psychiatric/Behavioral:  Negative for depression. The patient is not nervous/anxious.       PHYSICAL EXAMINATION    Vitals:   11/24/23 0953  BP: 109/83  Pulse: 83  Resp: 16  Temp: 97.8 F (36.6 C)  SpO2: 93%    Physical Exam Constitutional:      General: She is not in acute distress.    Appearance: Normal appearance. She is not  toxic-appearing.  HENT:     Head: Normocephalic and atraumatic.     Mouth/Throat:     Mouth: Mucous membranes are moist.     Pharynx: Oropharynx is clear. No oropharyngeal exudate or posterior oropharyngeal erythema.  Eyes:     General: No scleral icterus. Cardiovascular:     Rate and Rhythm: Normal rate and regular rhythm.     Pulses: Normal pulses.     Heart sounds: Normal heart sounds.  Pulmonary:     Effort: Pulmonary effort is normal.     Breath sounds: Normal breath sounds.  Abdominal:     General: Abdomen is flat. Bowel sounds are normal. There is no distension.     Palpations: Abdomen is soft.     Tenderness: There is no abdominal tenderness.  Musculoskeletal:        General: No swelling.     Cervical back: Neck supple.  Lymphadenopathy:     Cervical: No cervical adenopathy.  Skin:    General: Skin is warm and dry.     Findings: No rash.  Neurological:     General: No focal deficit present.     Mental Status: She is alert.  Psychiatric:        Mood and Affect: Mood normal.        Behavior: Behavior normal.     LABORATORY DATA:  CBC    Component Value Date/Time   WBC 4.7 11/24/2023 0931   WBC 4.3 09/28/2022 2240   RBC 4.72 11/24/2023 0931   HGB 14.9 11/24/2023 0931   HCT 44.6 11/24/2023 0931   PLT 170 11/24/2023 0931   MCV 94.5 11/24/2023 0931   MCH 31.6 11/24/2023 0931   MCHC 33.4 11/24/2023 0931  RDW 13.6 11/24/2023 0931   LYMPHSABS 1.4 11/24/2023 0931   MONOABS 0.7 11/24/2023 0931   EOSABS 0.1 11/24/2023 0931   BASOSABS 0.1 11/24/2023 0931    CMP     Component Value Date/Time   NA 140 11/24/2023 0931   K 4.1 11/24/2023 0931   CL 106 11/24/2023 0931   CO2 27 11/24/2023 0931   GLUCOSE 97 11/24/2023 0931   BUN 20 11/24/2023 0931   CREATININE 0.95 11/24/2023 0931   CREATININE 0.68 03/24/2016 1025   CALCIUM 9.5 11/24/2023 0931   PROT 7.8 11/24/2023 0931   ALBUMIN 4.2 11/24/2023 0931   AST 31 11/24/2023 0931   ALT 25 11/24/2023 0931    ALKPHOS 133 (H) 11/24/2023 0931   BILITOT 0.7 11/24/2023 0931   GFRNONAA 56 (L) 11/24/2023 0931   GFRAA  12/02/2007 0417    >60        The eGFR has been calculated using the MDRD equation. This calculation has not been validated in all clinical     ASSESSMENT and THERAPY PLAN:   Cancer of right breast metastatic to brain [facility]) CT CAP 06/03/2022: Large right pleural effusion, marked soft tissue thickening around the right hilum and right infrahilar region, mediastinal hilar and upper abdominal lymph nodes, right breast mass 5 cm   06/06/2022: Brain MRI: 2 small lesions left frontal lobe 3 mm and 4 mm concerning for metastatic disease, additional dural based contrast-enhancing lesion left frontal convexity possibly small meningioma, acute/subacute infarcts involving the left basal ganglia and right centrum semiovale   Hospitalization 06/03/2022-06/07/2022: Fatigue, shortness of breath Thoracentesis 06/04/2022: Negative for malignant cells U/S Right breast: 06/24/22: Rt breast mass: 6.3 cm, 3 LN axilla Rt Breast Biopsy: 06/29/22: Grade 2 IDC with extracellular mucin, lymph node positive for IDC ER 100%, PR 100%, Ki67 25%, HER2 0 negative Hospitalization 07/30/2022-07/31/2022 fall, urine incontinence CT CAP 09/01/2022: Developing multiple hepatic metastases, multiple areas of abnormal lymph node enlargement mediastinum and hilar appears similar, left adrenal metastases, persistent bilateral pleural effusions  CT CAP 12/30/2022: Moderate right pleural effusion, unchanged right axillary, mediastinal abdominal lymph nodes, unchanged liver and adrenal and bone metastases.  Masslike thickening endometrium unchanged.  Hydronephrosis left inferior pole unchanged CT CAP 07/04/2023: Unchanged small pleural effusions, unchanged right axillary mediastinal upper abdominal lymph nodes, unchanged liver adrenal and bone metastases.  Unchanged masslike thickening endometrium   Treatment plan:  Palliative treatment with  antiestrogen therapy with letrozole  2.5 mg daily:  Verzinio 50 p.o. once daily starting 10/21/2022 (initially twice daily but reduce to once daily because of fatigue)   Assessment & Plan Metastatic breast cancer Metastatic breast cancer stable on letrozole  and Verzenio . Verzenio  dose reduced due to fatigue. Normal labs, no new symptoms. Discussed bone-strengthening therapy for osseous involvement; benefits include fracture prevention and reduced osseous metastases. Bone scan considered but not planned. - Continue letrozole  and Verzenio  as prescribed. - Order CT chest, abdomen, and pelvis for mid-July. - Discuss bone-strengthening therapy with Doctor Gudena at next appointment. - Consider bone scan if recommended by Doctor Odean.    All questions were answered. The patient knows to call the clinic with any problems, questions or concerns. We can certainly see the patient much sooner if necessary.  Total encounter time:30 minutes*in face-to-face visit time, chart review, lab review, care coordination, order entry, and documentation of the encounter time.    Morna Kendall, NP 11/25/23 8:49 AM Medical Oncology and Hematology [facility] [address] [place], [place] [address] Tel. [phone]  Fax. [phone]  *Total Encounter Time as defined by the Centers for Medicare and Medicaid Services includes, in addition to the face-to-face time of a patient visit (documented in the note above) non-face-to-face time: obtaining and reviewing outside history, ordering and reviewing medications, tests or procedures, care coordination (communications with other health care professionals or caregivers) and documentation in the medical record.

## 2023-11-25 NOTE — Assessment & Plan Note (Signed)
 CT CAP 06/03/2022: Large right pleural effusion, marked soft tissue thickening around the right hilum and right infrahilar region, mediastinal hilar and upper abdominal lymph nodes, right breast mass 5 cm   06/06/2022: Brain MRI: 2 small lesions left frontal lobe 3 mm and 4 mm concerning for metastatic disease, additional dural based contrast-enhancing lesion left frontal convexity possibly small meningioma, acute/subacute infarcts involving the left basal ganglia and right centrum semiovale   Hospitalization 06/03/2022-06/07/2022: Fatigue, shortness of breath Thoracentesis 06/04/2022: Negative for malignant cells U/S Right breast: 06/24/22: Rt breast mass: 6.3 cm, 3 LN axilla Rt Breast Biopsy: 06/29/22: Grade 2 IDC with extracellular mucin, lymph node positive for IDC ER 100%, PR 100%, Ki67 25%, HER2 0 negative Hospitalization 07/30/2022-07/31/2022 fall, urine incontinence CT CAP 09/01/2022: Developing multiple hepatic metastases, multiple areas of abnormal lymph node enlargement mediastinum and hilar appears similar, left adrenal metastases, persistent bilateral pleural effusions  CT CAP 12/30/2022: Moderate right pleural effusion, unchanged right axillary, mediastinal abdominal lymph nodes, unchanged liver and adrenal and bone metastases.  Masslike thickening endometrium unchanged.  Hydronephrosis left inferior pole unchanged CT CAP 07/04/2023: Unchanged small pleural effusions, unchanged right axillary mediastinal upper abdominal lymph nodes, unchanged liver adrenal and bone metastases.  Unchanged masslike thickening endometrium   Treatment plan:  Palliative treatment with antiestrogen therapy with letrozole  2.5 mg daily:  Verzinio 50 p.o. once daily starting 10/21/2022 (initially twice daily but reduce to once daily because of fatigue)   Assessment & Plan Metastatic breast cancer Metastatic breast cancer stable on letrozole  and Verzenio . Verzenio  dose reduced due to fatigue. Normal labs, no new symptoms.  Discussed bone-strengthening therapy for osseous involvement; benefits include fracture prevention and reduced osseous metastases. Bone scan considered but not planned. - Continue letrozole  and Verzenio  as prescribed. - Order CT chest, abdomen, and pelvis for mid-July. - Discuss bone-strengthening therapy with Doctor Gudena at next appointment. - Consider bone scan if recommended by Doctor Lee Public.

## 2023-12-09 ENCOUNTER — Ambulatory Visit (HOSPITAL_COMMUNITY)

## 2023-12-20 ENCOUNTER — Ambulatory Visit (HOSPITAL_COMMUNITY)
Admission: RE | Admit: 2023-12-20 | Discharge: 2023-12-20 | Disposition: A | Discharge status: Home / Self Care | Source: Ambulatory Visit | Attending: Adult Health | Admitting: Adult Health

## 2023-12-20 DIAGNOSIS — C7931 Secondary malignant neoplasm of brain: Secondary | ICD-10-CM | POA: Diagnosis present

## 2023-12-20 DIAGNOSIS — C50911 Malignant neoplasm of unspecified site of right female breast: Secondary | ICD-10-CM | POA: Insufficient documentation

## 2023-12-20 MED ORDER — IOHEXOL 300 MG/ML  SOLN
100.0000 mL | Freq: Once | INTRAMUSCULAR | Status: AC | PRN
  Administered 2023-12-20: 100 mL via INTRAVENOUS

## 2023-12-23 ENCOUNTER — Other Ambulatory Visit: Payer: Self-pay

## 2023-12-23 DIAGNOSIS — N6310 Unspecified lump in the right breast, unspecified quadrant: Secondary | ICD-10-CM

## 2023-12-23 DIAGNOSIS — J910 Malignant pleural effusion: Secondary | ICD-10-CM

## 2023-12-23 DIAGNOSIS — C50911 Malignant neoplasm of unspecified site of right female breast: Secondary | ICD-10-CM

## 2023-12-25 NOTE — Assessment & Plan Note (Signed)
 CT CAP 06/03/2022: Large right pleural effusion, marked soft tissue thickening around the right hilum and right infrahilar region, mediastinal hilar and upper abdominal lymph nodes, right breast mass 5 cm   06/06/2022: Brain MRI: 2 small lesions left frontal lobe 3 mm and 4 mm concerning for metastatic disease, additional dural based contrast-enhancing lesion left frontal convexity possibly small meningioma, acute/subacute infarcts involving the left basal ganglia and right centrum semiovale   Hospitalization 06/03/2022-06/07/2022: Fatigue, shortness of breath Thoracentesis 06/04/2022: Negative for malignant cells U/S Right breast: 06/24/22: Rt breast mass: 6.3 cm, 3 LN axilla Rt Breast Biopsy: 06/29/22: Grade 2 IDC with extracellular mucin, lymph node positive for IDC ER 100%, PR 100%, Ki67 25%, HER2 0 negative Hospitalization 07/30/2022-07/31/2022 fall, urine incontinence CT CAP 09/01/2022: Developing multiple hepatic metastases, multiple areas of abnormal lymph node enlargement mediastinum and hilar appears similar, left adrenal metastases, persistent bilateral pleural effusions    Treatment plan:  Palliative treatment with antiestrogen therapy with letrozole  2.5 mg daily:  Verzinio 50 p.o. once daily starting 10/21/2022 (initially twice daily but reduce to once daily because of fatigue)   Verzinio toxicities: Fatigue   Mild leukopenia: Improved after reducing the dosage to once a day.   CT CAP 12/30/2022: Moderate right pleural effusion, unchanged right axillary, mediastinal abdominal lymph nodes, unchanged liver and adrenal and bone metastases.  Masslike thickening endometrium unchanged.  Hydronephrosis left inferior pole unchanged   CT CAP 07/04/2023: Unchanged small pleural effusions, unchanged right axillary mediastinal upper abdominal lymph nodes, unchanged liver adrenal and bone metastases.  Unchanged masslike thickening endometrium   Patient has a follow-up in 2 months to see John. She will be  followed every 2 months between John and myself. CT CAP 12/20/23:

## 2023-12-26 ENCOUNTER — Inpatient Hospital Stay: Attending: Hematology and Oncology | Admitting: Hematology and Oncology

## 2023-12-26 ENCOUNTER — Inpatient Hospital Stay

## 2023-12-26 VITALS — BP 146/96 | HR 80 | Temp 97.7°F | Resp 17 | Ht 65.0 in | Wt 127.4 lb

## 2023-12-26 DIAGNOSIS — C7931 Secondary malignant neoplasm of brain: Secondary | ICD-10-CM | POA: Diagnosis present

## 2023-12-26 DIAGNOSIS — C787 Secondary malignant neoplasm of liver and intrahepatic bile duct: Secondary | ICD-10-CM | POA: Diagnosis not present

## 2023-12-26 DIAGNOSIS — Z1732 Human epidermal growth factor receptor 2 negative status: Secondary | ICD-10-CM | POA: Insufficient documentation

## 2023-12-26 DIAGNOSIS — Z1721 Progesterone receptor positive status: Secondary | ICD-10-CM | POA: Diagnosis not present

## 2023-12-26 DIAGNOSIS — C50911 Malignant neoplasm of unspecified site of right female breast: Secondary | ICD-10-CM | POA: Insufficient documentation

## 2023-12-26 DIAGNOSIS — N6310 Unspecified lump in the right breast, unspecified quadrant: Secondary | ICD-10-CM

## 2023-12-26 DIAGNOSIS — Z170 Estrogen receptor positive status [ER+]: Secondary | ICD-10-CM | POA: Insufficient documentation

## 2023-12-26 DIAGNOSIS — Z79811 Long term (current) use of aromatase inhibitors: Secondary | ICD-10-CM | POA: Diagnosis not present

## 2023-12-26 DIAGNOSIS — J910 Malignant pleural effusion: Secondary | ICD-10-CM

## 2023-12-26 LAB — CMP (CANCER CENTER ONLY)
ALT: 18 U/L (ref 0–44)
AST: 25 U/L (ref 15–41)
Albumin: 4 g/dL (ref 3.5–5.0)
Alkaline Phosphatase: 105 U/L (ref 38–126)
Anion gap: 6 (ref 5–15)
BUN: 17 mg/dL (ref 8–23)
CO2: 27 mmol/L (ref 22–32)
Calcium: 9.5 mg/dL (ref 8.9–10.3)
Chloride: 107 mmol/L (ref 98–111)
Creatinine: 0.94 mg/dL (ref 0.44–1.00)
GFR, Estimated: 56 mL/min — ABNORMAL LOW (ref >60)
Glucose, Bld: 94 mg/dL (ref 70–99)
Potassium: 3.8 mmol/L (ref 3.5–5.1)
Sodium: 140 mmol/L (ref 135–145)
Total Bilirubin: 0.6 mg/dL (ref 0.0–1.2)
Total Protein: 7.3 g/dL (ref 6.5–8.1)

## 2023-12-26 LAB — CBC WITH DIFFERENTIAL (CANCER CENTER ONLY)
Abs Immature Granulocytes: 0.02 10*3/uL (ref 0.00–0.07)
Basophils Absolute: 0.1 10*3/uL (ref 0.0–0.1)
Basophils Relative: 1 %
Eosinophils Absolute: 0.2 10*3/uL (ref 0.0–0.5)
Eosinophils Relative: 4 %
HCT: 42.8 % (ref 36.0–46.0)
Hemoglobin: 14.2 g/dL (ref 12.0–15.0)
Immature Granulocytes: 0 %
Lymphocytes Relative: 35 %
Lymphs Abs: 1.8 10*3/uL (ref 0.7–4.0)
MCH: 31.3 pg (ref 26.0–34.0)
MCHC: 33.2 g/dL (ref 30.0–36.0)
MCV: 94.5 fL (ref 80.0–100.0)
Monocytes Absolute: 0.7 10*3/uL (ref 0.1–1.0)
Monocytes Relative: 13 %
Neutro Abs: 2.4 10*3/uL (ref 1.7–7.7)
Neutrophils Relative %: 47 %
Platelet Count: 194 10*3/uL (ref 150–400)
RBC: 4.53 MIL/uL (ref 3.87–5.11)
RDW: 13.4 % (ref 11.5–15.5)
WBC Count: 5.1 10*3/uL (ref 4.0–10.5)
nRBC: 0 % (ref 0.0–0.2)

## 2023-12-26 NOTE — Progress Notes (Signed)
 Patient Care Team: Gil Greig BRAVO, NP as PCP - General (Adult Health Nurse Practitioner) Odean Potts, MD as Consulting Physician (Hematology and Oncology) Glean Stephane BROCKS, RN (Inactive) as Oncology Nurse Navigator Tyree Nanetta SAILOR, RN as Oncology Nurse Navigator Cleotilde Elspeth CROME, OD (Optometry) Croitoru, Jerel, MD as Consulting Physician (Cardiology) Odean Potts, MD as Consulting Physician (Hematology and Oncology) Shona Rush, MD (Dermatology)  DIAGNOSIS:  Encounter Diagnosis  Name Primary?   Cancer of right breast metastatic to brain (HCC) Yes     CHIEF COMPLIANT: Follow-up of metastatic breast cancer on Verzinio  HISTORY OF PRESENT ILLNESS:   History of Present Illness [name] is a 89 year old female who presents for follow-up after a CT scan.  The recent whole body CT scan shows a reduction in pleural effusion and unchanged scar tissue in the lung. Liver lesions are less visible compared to previous scans.  She feels well with a good appetite and adequate sleep. There is no diarrhea or increased fatigue. Blood work today shows normal results except for a mildly elevated alkaline phosphatase level, which has been decreasing over time. Bruising is present from the CT scan procedure due to difficulty finding a vein.     ALLERGIES:  is allergic to lipitor [atorvastatin] and simvastatin.  MEDICATIONS:  Current Outpatient Medications  Medication Sig Dispense Refill   abemaciclib  (VERZENIO ) 50 MG tablet Take 50 mg by mouth at bedtime.     ascorbic acid (VITAMIN C) 250 MG CHEW Chew 250 mg by mouth daily.     aspirin  EC 81 MG tablet Take 81 mg by mouth daily. Swallow whole.     Cholecalciferol (VITAMIN D-3 PO) Take by mouth.     Cyanocobalamin (VITAMIN B-12 PO) Take by mouth.     letrozole  (FEMARA ) 2.5 MG tablet TAKE 1 TABLET BY MOUTH DAILY 90 tablet 3   Multiple Vitamin (MULTIVITAMIN PO) Take 1 tablet by mouth daily.     Zinc 30 MG CAPS Take 30 mg by mouth daily.      No current facility-administered medications for this visit.    PHYSICAL EXAMINATION: ECOG PERFORMANCE STATUS: 1 - Symptomatic but completely ambulatory  Vitals:   12/26/23 1020  BP: (!) 146/96  Pulse: 80  Resp: 17  Temp: 97.7 F (36.5 C)  SpO2: 96%   Filed Weights   12/26/23 1020  Weight: 127 lb 6.4 oz (57.8 kg)      LABORATORY DATA:  I have reviewed the data as listed    Latest Ref Rng & Units 11/24/2023    9:31 AM 09/01/2023    9:42 AM 06/30/2023    9:17 AM  CMP  Glucose 70 - 99 mg/dL 97  99  895   BUN 8 - 23 mg/dL 20  16  12    Creatinine 0.44 - 1.00 mg/dL 9.04  9.06  9.14   Sodium 135 - 145 mmol/L 140  140  141   Potassium 3.5 - 5.1 mmol/L 4.1  4.2  4.0   Chloride 98 - 111 mmol/L 106  104  106   CO2 22 - 32 mmol/L 27  29  28    Calcium 8.9 - 10.3 mg/dL 9.5  9.6  9.6   Total Protein 6.5 - 8.1 g/dL 7.8  7.8  7.6   Total Bilirubin 0.0 - 1.2 mg/dL 0.7  0.9  0.9   Alkaline Phos 38 - 126 U/L 133  152  169   AST 15 - 41 U/L 31  33  37   ALT 0 - 44 U/L 25  51  37     Lab Results  Component Value Date   WBC 5.1 12/26/2023   HGB 14.2 12/26/2023   HCT 42.8 12/26/2023   MCV 94.5 12/26/2023   PLT 194 12/26/2023   NEUTROABS 2.4 12/26/2023    ASSESSMENT & PLAN:  Cancer of right breast metastatic to brain [facility]) CT CAP 06/03/2022: Large right pleural effusion, marked soft tissue thickening around the right hilum and right infrahilar region, mediastinal hilar and upper abdominal lymph nodes, right breast mass 5 cm   06/06/2022: Brain MRI: 2 small lesions left frontal lobe 3 mm and 4 mm concerning for metastatic disease, additional dural based contrast-enhancing lesion left frontal convexity possibly small meningioma, acute/subacute infarcts involving the left basal ganglia and right centrum semiovale   Hospitalization 06/03/2022-06/07/2022: Fatigue, shortness of breath Thoracentesis 06/04/2022: Negative for malignant cells U/S Right breast: 06/24/22: Rt breast mass: 6.3 cm,  3 LN axilla Rt Breast Biopsy: 06/29/22: Grade 2 IDC with extracellular mucin, lymph node positive for IDC ER 100%, PR 100%, Ki67 25%, HER2 0 negative Hospitalization 07/30/2022-07/31/2022 fall, urine incontinence CT CAP 09/01/2022: Developing multiple hepatic metastases, multiple areas of abnormal lymph node enlargement mediastinum and hilar appears similar, left adrenal metastases, persistent bilateral pleural effusions    Treatment plan:  Palliative treatment with antiestrogen therapy with letrozole  2.5 mg daily:  Verzinio 50 p.o. once daily starting 10/21/2022 (initially twice daily but reduce to once daily because of fatigue)   Verzinio toxicities: Fatigue   Mild leukopenia: Improved after reducing the dosage to once a day.   CT CAP 12/30/2022: Moderate right pleural effusion, unchanged right axillary, mediastinal abdominal lymph nodes, unchanged liver and adrenal and bone metastases.  Masslike thickening endometrium unchanged.  Hydronephrosis left inferior pole unchanged   CT CAP 07/04/2023: Unchanged small pleural effusions, unchanged right axillary mediastinal upper abdominal lymph nodes, unchanged liver adrenal and bone metastases.  Unchanged masslike thickening endometrium   Patient has a follow-up in 2 months to see John. She will be followed every 2 months between John and myself. CT CAP 12/20/23: To my review it appears to be improved.  We are waiting for the final radiology report. ------------------------------------- Assessment and Plan Assessment & Plan Metastatic breast cancer Improvement on CT scan with less visible liver lesions. Alkaline phosphatase mildly elevated but improving. - Continue Abemaciclib  and Letrozole . - Schedule follow-up CT scan in six months. - Conduct blood work in three months.  Pleural effusion Decreased in size of pleural effusion with improved lung aeration. - Monitor with follow-up imaging in six months.      No orders of the defined types were placed  in this encounter.  The patient has a good understanding of the overall plan. she agrees with it. she will call with any problems that may develop before the next visit here. Total time spent: 30 mins including face to face time and time spent for planning, charting and co-ordination of care   Viinay K Kaylee Trivett, MD 12/26/23

## 2024-02-02 ENCOUNTER — Other Ambulatory Visit: Payer: Self-pay | Admitting: Hematology and Oncology

## 2024-03-26 ENCOUNTER — Inpatient Hospital Stay: Attending: Hematology and Oncology

## 2024-03-26 ENCOUNTER — Telehealth: Admitting: Pharmacist

## 2024-03-26 ENCOUNTER — Inpatient Hospital Stay: Admitting: Pharmacist

## 2024-03-26 DIAGNOSIS — Z1721 Progesterone receptor positive status: Secondary | ICD-10-CM | POA: Diagnosis not present

## 2024-03-26 DIAGNOSIS — Z1732 Human epidermal growth factor receptor 2 negative status: Secondary | ICD-10-CM | POA: Diagnosis not present

## 2024-03-26 DIAGNOSIS — Z170 Estrogen receptor positive status [ER+]: Secondary | ICD-10-CM | POA: Insufficient documentation

## 2024-03-26 DIAGNOSIS — C50911 Malignant neoplasm of unspecified site of right female breast: Secondary | ICD-10-CM | POA: Diagnosis present

## 2024-03-26 DIAGNOSIS — Z79899 Other long term (current) drug therapy: Secondary | ICD-10-CM | POA: Insufficient documentation

## 2024-03-26 DIAGNOSIS — C7931 Secondary malignant neoplasm of brain: Secondary | ICD-10-CM | POA: Insufficient documentation

## 2024-03-26 LAB — CBC WITH DIFFERENTIAL (CANCER CENTER ONLY)
Abs Immature Granulocytes: 0.02 K/uL (ref 0.00–0.07)
Basophils Absolute: 0.1 K/uL (ref 0.0–0.1)
Basophils Relative: 1 %
Eosinophils Absolute: 0.4 K/uL (ref 0.0–0.5)
Eosinophils Relative: 8 %
HCT: 45.4 % (ref 36.0–46.0)
Hemoglobin: 15.2 g/dL — ABNORMAL HIGH (ref 12.0–15.0)
Immature Granulocytes: 0 %
Lymphocytes Relative: 30 %
Lymphs Abs: 1.5 K/uL (ref 0.7–4.0)
MCH: 31.8 pg (ref 26.0–34.0)
MCHC: 33.5 g/dL (ref 30.0–36.0)
MCV: 95 fL (ref 80.0–100.0)
Monocytes Absolute: 0.6 K/uL (ref 0.1–1.0)
Monocytes Relative: 11 %
Neutro Abs: 2.5 K/uL (ref 1.7–7.7)
Neutrophils Relative %: 50 %
Platelet Count: 192 K/uL (ref 150–400)
RBC: 4.78 MIL/uL (ref 3.87–5.11)
RDW: 13.6 % (ref 11.5–15.5)
WBC Count: 5 K/uL (ref 4.0–10.5)
nRBC: 0 % (ref 0.0–0.2)

## 2024-03-26 LAB — CMP (CANCER CENTER ONLY)
ALT: 19 U/L (ref 0–44)
AST: 27 U/L (ref 15–41)
Albumin: 4.1 g/dL (ref 3.5–5.0)
Alkaline Phosphatase: 122 U/L (ref 38–126)
Anion gap: 7 (ref 5–15)
BUN: 11 mg/dL (ref 8–23)
CO2: 27 mmol/L (ref 22–32)
Calcium: 9.6 mg/dL (ref 8.9–10.3)
Chloride: 107 mmol/L (ref 98–111)
Creatinine: 0.85 mg/dL (ref 0.44–1.00)
GFR, Estimated: >60 mL/min (ref >60)
Glucose, Bld: 92 mg/dL (ref 70–99)
Potassium: 4.3 mmol/L (ref 3.5–5.1)
Sodium: 141 mmol/L (ref 135–145)
Total Bilirubin: 0.8 mg/dL (ref 0.0–1.2)
Total Protein: 7.5 g/dL (ref 6.5–8.1)

## 2024-03-26 NOTE — Progress Notes (Signed)
 [facility]       Telephone: [phone]?Fax: [phone]   Oncology Clinical Pharmacist Practitioner Progress Note  [name] was contacted via telephone to discuss her chemotherapy regimen for abemaciclib  which they receive under the care of Dr. Vinay Gudena. She came in for labs this morning and preferred to do a phone visit this afternoon.  I connected with [name] today by telephone and verified that I was speaking with the correct person using two patient identifiers. I discussed the limitations, risks, security and privacy concerns of performing an evaluation and management service by telemedicine and the availability of in-person appointments. The patient/caregiver expressed understanding and agreed to proceed.  Other persons participating in the visit and their role in the encounter: daughters   Patient's location: home  Provider's location: clinic   Current treatment regimen and start date Abemaciclib  (09/22/22) 50 mg daily (10/21/22) 50 mg BID (09/22/22) Letrozole  (07/05/22)   Interval History She continues on abemaciclib  50 mg by mouth daily on days 1 to 28 of a 28-day cycle. This is being given in combination with letrozole . Therapy is planned to continue until disease progression or unacceptable toxicity.  The abemaciclib  was reduced to once daily on 10/21/22 by Dr. Odean after patient was not feeling well on the BID dosing. [name] last visited with clinical pharmacy on 09/01/23 and Dr. Gudena on 12/26/23. He will see her again with labs on 06/12/24. Will move labs from 06/05/24 to 06/12/24 per patient preference to have on same day. He recommended CT scan in 6 months at his last visit (~06/26/24). This has not been ordered yet.   Response to Therapy She is doing well. She is not having any side effects from abemaciclib  reported. Hgb is just above ULN. Will monitor.  Labs, vitals, treatment parameters, and manufacturer guidelines assessing toxicity were  reviewed with Lynn DEL Whitelaw today. Based on these values, patient is in agreement to continue abemaciclib  therapy at this time.  Allergies Allergies  Allergen Reactions   Lipitor [Atorvastatin]    Simvastatin    Vitals    12/26/2023   10:20 AM 11/24/2023    9:53 AM 09/01/2023   10:16 AM  Oncology Vitals  Height 165 cm  165 cm  Weight 57.788 kg 57.38 kg 56.382 kg  Weight (lbs) 127 lbs 6 oz 126 lbs 8 oz 124 lbs 5 oz  BMI 21.2 kg/m2 21.05 kg/m2 20.68 kg/m2  Temp 97.7 F (36.5 C) 97.8 F (36.6 C) 98 F (36.7 C)  Pulse Rate 80 83 82  BP 146/96 109/83 143/59  Resp 17 16 17   SpO2 96 % 93 % 97 %  BSA (m2) 1.63 m2 1.62 m2 1.61 m2    Laboratory Data    Latest Ref Rng & Units 03/26/2024   10:53 AM 12/26/2023    9:57 AM 11/24/2023    9:31 AM  CBC EXTENDED  WBC 4.0 - 10.5 K/uL 5.0  5.1  4.7   RBC 3.87 - 5.11 MIL/uL 4.78  4.53  4.72   Hemoglobin 12.0 - 15.0 g/dL 84.7  85.7  85.0   HCT 36.0 - 46.0 % 45.4  42.8  44.6   Platelets 150 - 400 K/uL 192  194  170   NEUT# 1.7 - 7.7 K/uL 2.5  2.4  2.4   Lymph# 0.7 - 4.0 K/uL 1.5  1.8  1.4        Latest Ref Rng & Units 03/26/2024   10:53 AM  12/26/2023    9:57 AM 11/24/2023    9:31 AM  CMP  Glucose 70 - 99 mg/dL 92  94  97   BUN 8 - 23 mg/dL 11  17  20    Creatinine 0.44 - 1.00 mg/dL 9.14  9.05  9.04   Sodium 135 - 145 mmol/L 141  140  140   Potassium 3.5 - 5.1 mmol/L 4.3  3.8  4.1   Chloride 98 - 111 mmol/L 107  107  106   CO2 22 - 32 mmol/L 27  27  27    Calcium 8.9 - 10.3 mg/dL 9.6  9.5  9.5   Total Protein 6.5 - 8.1 g/dL 7.5  7.3  7.8   Total Bilirubin 0.0 - 1.2 mg/dL 0.8  0.6  0.7   Alkaline Phos 38 - 126 U/L 122  105  133   AST 15 - 41 U/L 27  25  31    ALT 0 - 44 U/L 19  18  25      Adverse Effects Assessment Hgb: slight above ULN at 15.2 g/dL. Monitor  Adherence Assessment [name] reports missing 0 doses over the past 12 weeks.   Reason for missed dose: n/a Patient was re-educated on importance of adherence.   Access  Assessment [name] is currently receiving her abemaciclib  through [facility] concerns:  none  Medication Reconciliation The patient's medication list was reviewed today with the patient? Yes New medications or herbal supplements have recently been started? no Any medications have been discontinued? no The medication list was updated and reconciled based on the patient's most recent medication list in the electronic medical record (EMR) including herbal products and OTC medications.   Medications Current Outpatient Medications  Medication Sig Dispense Refill   ascorbic acid (VITAMIN C) 250 MG CHEW Chew 250 mg by mouth daily.     aspirin  EC 81 MG tablet Take 81 mg by mouth daily. Swallow whole.     Cholecalciferol (VITAMIN D-3 PO) Take by mouth.     Cyanocobalamin (VITAMIN B-12 PO) Take by mouth.     letrozole  (FEMARA ) 2.5 MG tablet TAKE 1 TABLET BY MOUTH DAILY 90 tablet 3   Multiple Vitamin (MULTIVITAMIN PO) Take 1 tablet by mouth daily.     VERZENIO  50 MG tablet TAKE 1 TABLET BY MOUTH ONCE DAILY 28 tablet 5   Zinc 30 MG CAPS Take 30 mg by mouth daily.     No current facility-administered medications for this visit.   Drug-Drug Interactions (DDIs) DDIs were evaluated? Yes Significant DDIs? No  The patient was instructed to speak with their health care provider and/or the oral chemotherapy pharmacist before starting any new drug, including prescription or over the counter, natural / herbal products, or vitamins.  Supportive Care Diarrhea: we reviewed that diarrhea is common with abemaciclib  and confirmed that she does have loperamide (Imodium) at home.  We reviewed how to take this medication PRN. Neutropenia: we discussed the importance of having a thermometer and what the Centers for Disease Control and Prevention (CDC) considers a fever which is 100.69F (38C) or higher.  Gave patient 24/7 triage line to call if any fevers or  symptoms. ILD/Pneumonitis: we reviewed potential symptoms including cough, shortness, and fatigue.  VTE: reviewed signs of DVT such as leg swelling, redness, pain, or tenderness and signs of PE such as shortness of breath, rapid or irregular heartbeat, cough, chest pain, or lightheadedness. Reviewed to take the medication every 12 hours (  with food sometimes can be easier on the stomach) and to take it at the same time every day. Hepatotoxicity:[facility] Drug interactions with grapefruit products  Dosing Assessment Hepatic adjustments needed? No  Renal adjustments needed? No  Toxicity adjustments needed? No  The current dosing regimen is appropriate to continue at this time.  Follow-Up Plan Continue abemaciclib  50 mg by mouth daily. Unable to tolerate BID dosing. Continue letrozole  2.5 mg by mouth daily Monitor for side effects Labs on 06/05/24 scheduled with visit with Dr. Gudena on 06/12/24. Will move labs to 06/12/24 to be on same day per patient preference. He may order CT at this time per his note from 12/26/23. Lynn DEL Pharris can follow up with clinical pharmacy as deemed necessary by Dr. Mackey Chad going forward   Lynn DEL Leath participated in the discussion, expressed understanding, and voiced agreement with the above plan. All questions were answered to her satisfaction. The patient was advised to contact the clinic at [phone] with any questions or concerns prior to her return visit.   I spent 30 minutes assessing and educating the patient.  Asmaa Tirpak A. Lucila, PharmD, BCOP, CPP  Norleen DELENA Lucila, RPH-CPP, 03/26/2024  1:07 PM   **Disclaimer: This note was dictated with voice recognition software. Similar sounding words can inadvertently be transcribed and this note may contain transcription errors which may not have been corrected upon publication of note.**

## 2024-03-27 ENCOUNTER — Inpatient Hospital Stay

## 2024-03-27 ENCOUNTER — Inpatient Hospital Stay: Admitting: Pharmacist

## 2024-06-05 ENCOUNTER — Other Ambulatory Visit

## 2024-06-11 ENCOUNTER — Other Ambulatory Visit: Payer: Self-pay | Admitting: *Deleted

## 2024-06-11 DIAGNOSIS — C50911 Malignant neoplasm of unspecified site of right female breast: Secondary | ICD-10-CM

## 2024-06-12 ENCOUNTER — Inpatient Hospital Stay: Attending: Hematology and Oncology | Admitting: Hematology and Oncology

## 2024-06-12 ENCOUNTER — Inpatient Hospital Stay

## 2024-06-12 VITALS — BP 132/75 | HR 90 | Temp 97.3°F | Resp 20 | Wt 129.4 lb

## 2024-06-12 DIAGNOSIS — C7931 Secondary malignant neoplasm of brain: Secondary | ICD-10-CM | POA: Diagnosis not present

## 2024-06-12 DIAGNOSIS — Z17411 Hormone receptor positive with human epidermal growth factor receptor 2 negative status: Secondary | ICD-10-CM | POA: Diagnosis not present

## 2024-06-12 DIAGNOSIS — C7951 Secondary malignant neoplasm of bone: Secondary | ICD-10-CM | POA: Insufficient documentation

## 2024-06-12 DIAGNOSIS — C787 Secondary malignant neoplasm of liver and intrahepatic bile duct: Secondary | ICD-10-CM | POA: Insufficient documentation

## 2024-06-12 DIAGNOSIS — C50911 Malignant neoplasm of unspecified site of right female breast: Secondary | ICD-10-CM

## 2024-06-12 DIAGNOSIS — C7970 Secondary malignant neoplasm of unspecified adrenal gland: Secondary | ICD-10-CM | POA: Insufficient documentation

## 2024-06-12 DIAGNOSIS — Z79811 Long term (current) use of aromatase inhibitors: Secondary | ICD-10-CM | POA: Diagnosis not present

## 2024-06-12 LAB — CMP (CANCER CENTER ONLY)
ALT: 23 U/L (ref 0–44)
AST: 37 U/L (ref 15–41)
Albumin: 4 g/dL (ref 3.5–5.0)
Alkaline Phosphatase: 140 U/L — ABNORMAL HIGH (ref 38–126)
Anion gap: 11 (ref 5–15)
BUN: 12 mg/dL (ref 8–23)
CO2: 24 mmol/L (ref 22–32)
Calcium: 9.4 mg/dL (ref 8.9–10.3)
Chloride: 105 mmol/L (ref 98–111)
Creatinine: 0.93 mg/dL (ref 0.44–1.00)
GFR, Estimated: 57 mL/min — ABNORMAL LOW (ref >60)
Glucose, Bld: 99 mg/dL (ref 70–99)
Potassium: 4 mmol/L (ref 3.5–5.1)
Sodium: 140 mmol/L (ref 135–145)
Total Bilirubin: 0.5 mg/dL (ref 0.0–1.2)
Total Protein: 7.8 g/dL (ref 6.5–8.1)

## 2024-06-12 LAB — CBC WITH DIFFERENTIAL (CANCER CENTER ONLY)
Abs Immature Granulocytes: 0.02 K/uL (ref 0.00–0.07)
Basophils Absolute: 0.1 K/uL (ref 0.0–0.1)
Basophils Relative: 1 %
Eosinophils Absolute: 0.5 K/uL (ref 0.0–0.5)
Eosinophils Relative: 8 %
HCT: 42.2 % (ref 36.0–46.0)
Hemoglobin: 14.2 g/dL (ref 12.0–15.0)
Immature Granulocytes: 0 %
Lymphocytes Relative: 29 %
Lymphs Abs: 1.7 K/uL (ref 0.7–4.0)
MCH: 31.9 pg (ref 26.0–34.0)
MCHC: 33.6 g/dL (ref 30.0–36.0)
MCV: 94.8 fL (ref 80.0–100.0)
Monocytes Absolute: 0.8 K/uL (ref 0.1–1.0)
Monocytes Relative: 13 %
Neutro Abs: 2.9 K/uL (ref 1.7–7.7)
Neutrophils Relative %: 49 %
Platelet Count: 231 K/uL (ref 150–400)
RBC: 4.45 MIL/uL (ref 3.87–5.11)
RDW: 13.6 % (ref 11.5–15.5)
WBC Count: 5.9 K/uL (ref 4.0–10.5)
nRBC: 0 % (ref 0.0–0.2)

## 2024-06-12 MED ORDER — ABEMACICLIB 50 MG PO TABS: 50.0000 mg | ORAL_TABLET | Freq: Every day | ORAL | 11 refills | Status: AC

## 2024-06-12 NOTE — Assessment & Plan Note (Signed)
° °  CT CAP 06/03/2022: Large right pleural effusion, marked soft tissue thickening around the right hilum and right infrahilar region, mediastinal hilar and upper abdominal lymph nodes, right breast mass 5 cm   06/06/2022: Brain MRI: 2 small lesions left frontal lobe 3 mm and 4 mm concerning for metastatic disease, additional dural based contrast-enhancing lesion left frontal convexity possibly small meningioma, acute/subacute infarcts involving the left basal ganglia and right centrum semiovale   Hospitalization 06/03/2022-06/07/2022: Fatigue, shortness of breath Thoracentesis 06/04/2022: Negative for malignant cells U/S Right breast: 06/24/22: Rt breast mass: 6.3 cm, 3 LN axilla Rt Breast Biopsy: 06/29/22: Grade 2 IDC with extracellular mucin, lymph node positive for IDC ER 100%, PR 100%, Ki67 25%, HER2 0 negative Hospitalization 07/30/2022-07/31/2022 fall, urine incontinence CT CAP 09/01/2022: Developing multiple hepatic metastases, multiple areas of abnormal lymph node enlargement mediastinum and hilar appears similar, left adrenal metastases, persistent bilateral pleural effusions    Treatment plan:  Palliative treatment with antiestrogen therapy with letrozole  2.5 mg daily:  Verzinio 50 p.o. once daily starting 10/21/2022 (initially twice daily but reduce to once daily because of fatigue)   Verzinio toxicities: Fatigue   Mild leukopenia: Improved after reducing the dosage to once a day.   CT CAP 12/30/2022: Moderate right pleural effusion, unchanged right axillary, mediastinal abdominal lymph nodes, unchanged liver and adrenal and bone metastases.  Masslike thickening endometrium unchanged.  Hydronephrosis left inferior pole unchanged   CT CAP 07/04/2023: Unchanged small pleural effusions, unchanged right axillary mediastinal upper abdominal lymph nodes, unchanged liver adrenal and bone metastases.  Unchanged masslike thickening endometrium CT CAP 12/26/2023: Unchanged right axillary, mediastinal, upper  abdominal lymph nodes, stable liver adrenal pleural bone metastases  We discussed the role of scans and felt that given her age and health, we decided to hold off on doing any additional scans.

## 2024-06-12 NOTE — Progress Notes (Signed)
 Patient Care Team: Gil Greig BRAVO, NP as PCP - General (Adult Health Nurse Practitioner) Odean Potts, MD as Consulting Physician (Hematology and Oncology) Tyree Nanetta SAILOR, RN as Oncology Nurse Navigator Cleotilde Elspeth CROME, OD (Optometry) Croitoru, Jerel, MD as Consulting Physician (Cardiology) Odean Potts, MD as Consulting Physician (Hematology and Oncology) Shona Rush, MD (Dermatology)  DIAGNOSIS:  Encounter Diagnosis  Name Primary?   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  She has taken abemaciclib  50 mg once daily and letrozole  2.5 mg daily for about two years without difficulty and without new treatment-related symptoms.  She denies fatigue, pain, or other new complaints and remains clinically stable with no recent changes in health status.  No recent imaging or mammography has been obtained. Recent blood work has been unremarkable.  She reports no issues obtaining medications through the specialty pharmacy.  She feels strong and has no new concerns.      ALLERGIES:  is allergic to lipitor [atorvastatin] and simvastatin.  MEDICATIONS:  Current Outpatient Medications  Medication Sig Dispense Refill   abemaciclib  (VERZENIO ) 50 MG tablet Take 1 tablet (50 mg total) by mouth daily. 28 tablet 11   ascorbic acid (VITAMIN C) 250 MG CHEW Chew 250 mg by mouth daily.     aspirin  EC 81 MG tablet Take 81 mg by mouth daily. Swallow whole.     Cholecalciferol (VITAMIN D-3 PO) Take by mouth.     Cyanocobalamin (VITAMIN B-12 PO) Take by mouth.     letrozole  (FEMARA ) 2.5 MG tablet TAKE 1 TABLET BY MOUTH DAILY 90 tablet 3    Multiple Vitamin (MULTIVITAMIN PO) Take 1 tablet by mouth daily.     Zinc 30 MG CAPS Take 30 mg by mouth daily.     No current facility-administered medications for this visit.    PHYSICAL EXAMINATION: ECOG PERFORMANCE STATUS: 1 - Symptomatic but completely ambulatory  Vitals:   06/12/24 1126  BP: 132/75  Pulse: 90  Resp: 20  Temp: (!) 97.3 F (36.3 C)  SpO2: 100%   Filed Weights   06/12/24 1126  Weight: 129 lb 6.4 oz (58.7 kg)   LABORATORY DATA:  I have reviewed the data as listed    Latest Ref Rng & Units 03/26/2024   10:53 AM 12/26/2023    9:57 AM 11/24/2023    9:31 AM  CMP  Glucose 70 - 99 mg/dL 92  94  97   BUN 8 - 23 mg/dL 11  17  20    Creatinine 0.44 - 1.00 mg/dL 9.14  9.05  9.04   Sodium 135 - 145 mmol/L 141  140  140   Potassium 3.5 - 5.1 mmol/L 4.3  3.8  4.1   Chloride 98 - 111 mmol/L 107  107  106   CO2 22 - 32 mmol/L 27  27  27    Calcium 8.9 - 10.3 mg/dL 9.6  9.5  9.5   Total Protein 6.5 - 8.1 g/dL 7.5  7.3  7.8   Total Bilirubin 0.0 - 1.2 mg/dL 0.8  0.6  0.7   Alkaline Phos 38 - 126 U/L 122  105  133   AST 15 - 41 U/L 27  25  31   ALT 0 - 44 U/L 19  18  25      Lab Results  Component Value Date   WBC 5.9 06/12/2024   HGB 14.2 06/12/2024   HCT 42.2 06/12/2024   MCV 94.8 06/12/2024   PLT 231 06/12/2024   NEUTROABS 2.9 06/12/2024    ASSESSMENT & PLAN:  Cancer of right breast metastatic to brain [facility])   CT CAP 06/03/2022: Large right pleural effusion, marked soft tissue thickening around the right hilum and right infrahilar region, mediastinal hilar and upper abdominal lymph nodes, right breast mass 5 cm   06/06/2022: Brain MRI: 2 small lesions left frontal lobe 3 mm and 4 mm concerning for metastatic disease, additional dural based contrast-enhancing lesion left frontal convexity possibly small meningioma, acute/subacute infarcts involving the left basal ganglia and right centrum semiovale   Hospitalization 06/03/2022-06/07/2022: Fatigue, shortness  of breath Thoracentesis 06/04/2022: Negative for malignant cells U/S Right breast: 06/24/22: Rt breast mass: 6.3 cm, 3 LN axilla Rt Breast Biopsy: 06/29/22: Grade 2 IDC with extracellular mucin, lymph node positive for IDC ER 100%, PR 100%, Ki67 25%, HER2 0 negative Hospitalization 07/30/2022-07/31/2022 fall, urine incontinence CT CAP 09/01/2022: Developing multiple hepatic metastases, multiple areas of abnormal lymph node enlargement mediastinum and hilar appears similar, left adrenal metastases, persistent bilateral pleural effusions    Treatment plan:  Palliative treatment with antiestrogen therapy with letrozole  2.5 mg daily:  Verzinio 50 p.o. once daily starting 10/21/2022 (initially twice daily but reduce to once daily because of fatigue)   Verzinio toxicities: Fatigue   Mild leukopenia: Improved after reducing the dosage to once a day.   CT CAP 12/30/2022: Moderate right pleural effusion, unchanged right axillary, mediastinal abdominal lymph nodes, unchanged liver and adrenal and bone metastases.  Masslike thickening endometrium unchanged.  Hydronephrosis left inferior pole unchanged   CT CAP 07/04/2023: Unchanged small pleural effusions, unchanged right axillary mediastinal upper abdominal lymph nodes, unchanged liver adrenal and bone metastases.  Unchanged masslike thickening endometrium CT CAP 12/26/2023: Unchanged right axillary, mediastinal, upper abdominal lymph nodes, stable liver adrenal pleural bone metastases  We discussed the role of scans and felt that given her age and health, we decided to hold off on doing any additional scans. Return to clinic in 6 months with labs and follow-up  Orders Placed This Encounter  Procedures   CBC with Differential (Cancer Center Only)    Standing Status:   Future    Expiration Date:   06/12/2025   CMP (Cancer Center only)    Standing Status:   Future    Expiration Date:   06/12/2025   The patient has a good understanding of the overall plan. she  agrees with it. she will call with any problems that may develop before the next visit here.  I personally spent a total of 30 minutes in the care of the patient today including preparing to see the patient, getting/reviewing separately obtained history, performing a medically appropriate exam/evaluation, counseling and educating, placing orders, referring and communicating with other health care professionals, documenting clinical information in the EHR, independently interpreting results, communicating results, and coordinating care.   Viinay K Meeah Totino, MD 06/12/2024

## 2024-06-22 ENCOUNTER — Telehealth: Payer: Self-pay

## 2024-06-22 NOTE — Telephone Encounter (Signed)
 PAP reenrollment for year 2026    Application has been submitted for Verzenio  through [employer] with both Patient and doctor signatures, along with requested documentation.  Status: Approved through 06/27/2025  Charlott Hamilton,  CPhT-Adv  she/her/hers [facility]  [facility] Pharmacy Technician Patient [facility] Phone: [phone]  Fax: [phone] [email or web address]

## 2024-07-20 ENCOUNTER — Telehealth: Payer: Self-pay

## 2024-07-20 ENCOUNTER — Other Ambulatory Visit: Payer: Self-pay

## 2024-07-20 MED ORDER — CIPROFLOXACIN HCL 500 MG PO TABS: 500.0000 mg | ORAL_TABLET | Freq: Two times a day (BID) | ORAL | 0 refills | Status: AC

## 2024-07-20 NOTE — Telephone Encounter (Signed)
 RN attempted to call pts daughter x2, no answer.

## 2024-07-20 NOTE — Telephone Encounter (Signed)
 RN attempted x1 to call pt's daughter after receiving call from her reporting that her mother is experiencing pain w/ urination and that she's on Verzenio . LVM asking her to return my call so I can assess her sx.

## 2024-12-11 ENCOUNTER — Inpatient Hospital Stay

## 2024-12-11 ENCOUNTER — Inpatient Hospital Stay: Admitting: Hematology and Oncology
